# Patient Record
Sex: Female | Born: 1980 | Race: Black or African American | Hispanic: No | Marital: Married | Smoking: Never smoker
Health system: Southern US, Community
[De-identification: ages and names within clinical notes are randomized; demographics above are authoritative.]

## PROBLEM LIST (undated history)

## (undated) ENCOUNTER — Inpatient Hospital Stay (HOSPITAL_COMMUNITY): Payer: Self-pay

## (undated) DIAGNOSIS — F32A Depression, unspecified: Secondary | ICD-10-CM

## (undated) DIAGNOSIS — F419 Anxiety disorder, unspecified: Secondary | ICD-10-CM

## (undated) DIAGNOSIS — F329 Major depressive disorder, single episode, unspecified: Secondary | ICD-10-CM

## (undated) DIAGNOSIS — R42 Dizziness and giddiness: Secondary | ICD-10-CM

## (undated) DIAGNOSIS — T7840XA Allergy, unspecified, initial encounter: Secondary | ICD-10-CM

## (undated) DIAGNOSIS — R519 Headache, unspecified: Secondary | ICD-10-CM

## (undated) DIAGNOSIS — J45909 Unspecified asthma, uncomplicated: Secondary | ICD-10-CM

## (undated) DIAGNOSIS — K219 Gastro-esophageal reflux disease without esophagitis: Secondary | ICD-10-CM

## (undated) DIAGNOSIS — R51 Headache: Secondary | ICD-10-CM

## (undated) DIAGNOSIS — N809 Endometriosis, unspecified: Secondary | ICD-10-CM

## (undated) DIAGNOSIS — K589 Irritable bowel syndrome without diarrhea: Secondary | ICD-10-CM

## (undated) DIAGNOSIS — G43909 Migraine, unspecified, not intractable, without status migrainosus: Secondary | ICD-10-CM

## (undated) DIAGNOSIS — R569 Unspecified convulsions: Secondary | ICD-10-CM

## (undated) DIAGNOSIS — D689 Coagulation defect, unspecified: Secondary | ICD-10-CM

## (undated) DIAGNOSIS — F410 Panic disorder [episodic paroxysmal anxiety] without agoraphobia: Secondary | ICD-10-CM

## (undated) DIAGNOSIS — Z8619 Personal history of other infectious and parasitic diseases: Secondary | ICD-10-CM

## (undated) HISTORY — DX: Anxiety disorder, unspecified: F41.9

## (undated) HISTORY — DX: Personal history of other infectious and parasitic diseases: Z86.19

## (undated) HISTORY — DX: Coagulation defect, unspecified: D68.9

## (undated) HISTORY — DX: Unspecified asthma, uncomplicated: J45.909

## (undated) HISTORY — DX: Dizziness and giddiness: R42

## (undated) HISTORY — PX: ESOPHAGOGASTRODUODENOSCOPY: SHX1529

## (undated) HISTORY — DX: Unspecified convulsions: R56.9

## (undated) HISTORY — DX: Migraine, unspecified, not intractable, without status migrainosus: G43.909

## (undated) HISTORY — DX: Panic disorder (episodic paroxysmal anxiety): F41.0

## (undated) HISTORY — PX: NO PAST SURGERIES: SHX2092

## (undated) HISTORY — DX: Irritable bowel syndrome, unspecified: K58.9

## (undated) HISTORY — DX: Depression, unspecified: F32.A

## (undated) HISTORY — DX: Headache: R51

## (undated) HISTORY — DX: Gastro-esophageal reflux disease without esophagitis: K21.9

## (undated) HISTORY — DX: Headache, unspecified: R51.9

## (undated) HISTORY — DX: Allergy, unspecified, initial encounter: T78.40XA

## (undated) HISTORY — DX: Major depressive disorder, single episode, unspecified: F32.9

## (undated) HISTORY — DX: Endometriosis, unspecified: N80.9

---

## 2012-05-20 DIAGNOSIS — S069X9A Unspecified intracranial injury with loss of consciousness of unspecified duration, initial encounter: Secondary | ICD-10-CM

## 2012-05-20 DIAGNOSIS — S069XAA Unspecified intracranial injury with loss of consciousness status unknown, initial encounter: Secondary | ICD-10-CM

## 2012-05-20 HISTORY — DX: Unspecified intracranial injury with loss of consciousness status unknown, initial encounter: S06.9XAA

## 2012-05-20 HISTORY — DX: Unspecified intracranial injury with loss of consciousness of unspecified duration, initial encounter: S06.9X9A

## 2013-12-06 ENCOUNTER — Encounter: Payer: Self-pay | Admitting: Family Medicine

## 2013-12-06 ENCOUNTER — Ambulatory Visit (INDEPENDENT_AMBULATORY_CARE_PROVIDER_SITE_OTHER): Payer: BC Managed Care – PPO | Admitting: Family Medicine

## 2013-12-06 VITALS — BP 98/58 | HR 94 | Temp 99.4°F | Ht 65.25 in | Wt 146.0 lb

## 2013-12-06 DIAGNOSIS — F0781 Postconcussional syndrome: Secondary | ICD-10-CM

## 2013-12-06 DIAGNOSIS — F411 Generalized anxiety disorder: Secondary | ICD-10-CM

## 2013-12-06 DIAGNOSIS — G43009 Migraine without aura, not intractable, without status migrainosus: Secondary | ICD-10-CM

## 2013-12-06 DIAGNOSIS — R42 Dizziness and giddiness: Secondary | ICD-10-CM

## 2013-12-06 DIAGNOSIS — K219 Gastro-esophageal reflux disease without esophagitis: Secondary | ICD-10-CM

## 2013-12-06 DIAGNOSIS — F32A Depression, unspecified: Secondary | ICD-10-CM

## 2013-12-06 DIAGNOSIS — K589 Irritable bowel syndrome without diarrhea: Secondary | ICD-10-CM

## 2013-12-06 DIAGNOSIS — F3289 Other specified depressive episodes: Secondary | ICD-10-CM

## 2013-12-06 DIAGNOSIS — F329 Major depressive disorder, single episode, unspecified: Secondary | ICD-10-CM

## 2013-12-06 NOTE — Patient Instructions (Signed)
-  We have ordered labs or studies at this visit. It can take up to 1-2 weeks for results and processing. We will contact you with instructions IF your results are abnormal. Normal results will be [facility] to your [facility]. If you have not heard from us or can not find your results in [facility] in 2 weeks please contact our office.  Consider stopping your supplements and slowly weaning off of the amitriptyline if this is not helping  -PLEASE SIGN UP FOR MYCHART TODAY   We recommend the following healthy lifestyle measures: - eat a healthy diet consisting of lots of vegetables, fruits, beans, nuts, seeds, healthy meats such as white chicken and fish and whole grains.  - avoid fried foods, fast food, processed foods, sodas, red meet and other fattening foods.  - get a least 150 minutes of aerobic exercise per week.   Follow up in: 3 months for yearly physical exam

## 2013-12-06 NOTE — Progress Notes (Signed)
No chief complaint on file.   HPI:  Kelsey [name] is here to establish care. Recently moved here.  Last PCP and physical: pap in 10/2013 normal per her repot  Has the following chronic problems and concerns today:  There are no active problems to display for this patient.  Migraines/post-concussive syndrome/hx vertigo: -has had headaches and migraines for long time -worsened after bad fall 1 year ago -headaches daily over the last year - triggers (computer work, driving, reading) -headaches are sharp pain, stabbing pain, pressure bi-pariatal, R temporal, post occipital -followed by neurologist prior to move, had several MRIs in the past -currently taking tylenol or ibuprofen once per week -right before she moved headache specialist advised topamax titration and prn triptan which she doesn't want to tart until sees doctor here -headaches accompanied by ringing in ears, nausea, visual motion sensitivity (had months of vestibular rehab), dizziness, walks into things sometimes, light and noise sensitivity -had another fall about 1.5 months ago after fall on stairs -on disability in the past -denies: recent changes in headaches, fevers, weight loss, vision changes, aura, weakness or numbness -as needed meclizine  Contraception: -stable  IBS/GERD: -on amitriptyline 50 mg daily, prilosec and ranitidine -denies: constipation, diarrhea  Anxiety and Depression: -panic attacks -wants to see a psychiatrist here -has tried several medication, does not want to take benzos, sensitive to medications  ROS negative for unless reported above: fevers, unintentional weight loss, hearing or vision loss, chest pain, palpitations, struggling to breath, hemoptysis, melena, hematochezia, hematuria, falls, loc, si, thoughts of self harm  Past Medical History  Diagnosis Date  . Asthma   . Depression   . GERD (gastroesophageal reflux disease)   . Frequent headaches   . Migraines   . IBS (irritable  bowel syndrome)   . Panic attacks     Family History  Problem Relation Age of Onset  . Hypertension Mother   . Cancer Mother     multiple myeloma  . Hypertension Father   . Stroke Father   . Cancer Maternal Grandmother     CLL    History   Social History  . Marital Status: Married    Spouse Name: N/A    Number of Children: N/A  . Years of Education: N/A   Social History Main Topics  . Smoking status: Never Smoker   . Smokeless tobacco: None  . Alcohol Use: No  . Drug Use: No  . Sexual Activity: None   Other Topics Concern  . 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 , Disp: , Rfl: ;  Biotin 2500 MCG CAPS, Take by mouth., Disp: , Rfl: ;  Docosahexaenoic Acid (PRENATAL DHA PO), Take by mouth., Disp: , Rfl:  drospirenone-ethinyl estradiol (GIANVI) 3-0.02 MG tablet, Take 1 tablet by mouth daily., Disp: , Rfl: ;  Echinacea 350 MG CAPS, Take by mouth., Disp: , Rfl: ;  FERROUS SULFATE PO, Take 65 mg by mouth., Disp: , Rfl: ;  Flaxseed, Linseed, (FLAX SEED OIL PO), Take by mouth., Disp: , Rfl: ;  LORazepam (ATIVAN) 0.5 MG tablet, Take 0.5 mg by mouth every 8 (eight) hours as needed for anxiety (uses rarely for panic disorder)., Disp: , Rfl:  meclizine (ANTIVERT) 12.5 MG tablet, Take 12.5 mg by mouth 3 (three) times daily as needed for dizziness., Disp: , Rfl: ;  omeprazole (PRILOSEC) 20 MG capsule, Take 20 mg by mouth daily., Disp: , Rfl: ;  Pirbuterol Acetate (MAXAIR AUTOHALER IN), Inhale into the lungs., Disp: , Rfl: ;  ranitidine (ACID REDUCER) 150 MG  tablet, Take 150 mg by mouth 2 (two) times daily., Disp: , Rfl:   EXAM:  Filed Vitals:   12/06/13 1619  BP: 98/58  Pulse: 94  Temp: 99.4 F (37.4 C)    Body mass index is 24.12 kg/(m^2).  GENERAL: vitals reviewed and listed above, alert, oriented, appears well hydrated and in no acute distress  HEENT: atraumatic, conjunttiva clear, no obvious abnormalities on inspection of external nose and ears  NECK: no obvious masses on inspection  LUNGS: clear to auscultation bilaterally, no wheezes, rales or rhonchi, good air movement  CV: HRRR, no peripheral edema  MS: moves all extremities without noticeable abnormality  PSYCH: pleasant and cooperative, no obvious depression or anxiety  ASSESSMENT AND PLAN:  Discussed the following assessment and plan:  Generalized anxiety disorder - Plan: Ambulatory referral to Psychiatry  Depression - Plan: Ambulatory referral to Psychiatry  Migraine without aura and without status migrainosus, not intractable - Plan: Ambulatory referral to Neurology  IBS (irritable bowel syndrome)  Gastroesophageal reflux disease, esophagitis presence not specified  Post concussion syndrome - Plan: Ambulatory referral to Neurology  Vertigo - Plan: Ambulatory referral to Neurology  -We reviewed the PMH, PSH, FH, SH, Meds and Allergies. -We provided refills for any medications we will prescribe as needed. -We addressed current concerns per orders and patient instructions. -We have asked for records for pertinent exams, studies, vaccines and notes from previous providers. -We have advised patient to follow up per instructions below.    -Patient advised to return or notify a doctor immediately if symptoms worsen or persist or new concerns arise.  Patient Instructions  -We have ordered labs or studies at this visit. It can take up to 1-2 weeks for results and processing. We will contact you with instructions IF your results are abnormal. Normal results  will be released to your [facility]. If you have not heard from [place] or can not find your results in [facility] in 2 weeks please contact our office.  Consider stopping your supplements and slowly weaning off of the amitriptyline if this is not helping  -PLEASE SIGN UP FOR MYCHART TODAY   We recommend the following healthy lifestyle measures: - eat a healthy diet consisting of lots of vegetables, fruits, beans, nuts, seeds, healthy meats such as white chicken and fish and whole grains.  - avoid fried foods, fast food, processed foods, sodas, red meet and other fattening foods.  - get a least 150 minutes of aerobic exercise per week.   Follow up in: 3 months for yearly physical exam      Chereese Cilento R.

## 2013-12-06 NOTE — Progress Notes (Signed)
Pre visit review using our clinic review tool, if applicable. No additional management support is needed unless otherwise documented below in the visit note. 

## 2013-12-24 ENCOUNTER — Encounter: Payer: Self-pay | Admitting: Neurology

## 2013-12-29 ENCOUNTER — Ambulatory Visit (INDEPENDENT_AMBULATORY_CARE_PROVIDER_SITE_OTHER): Payer: BC Managed Care – PPO | Admitting: Neurology

## 2013-12-29 ENCOUNTER — Encounter: Payer: Self-pay | Admitting: Neurology

## 2013-12-29 VITALS — BP 110/84 | HR 106 | Ht 65.75 in | Wt 145.2 lb

## 2013-12-29 DIAGNOSIS — F0781 Postconcussional syndrome: Secondary | ICD-10-CM

## 2013-12-29 DIAGNOSIS — R42 Dizziness and giddiness: Secondary | ICD-10-CM

## 2013-12-29 DIAGNOSIS — R51 Headache: Secondary | ICD-10-CM

## 2013-12-29 DIAGNOSIS — R519 Headache, unspecified: Secondary | ICD-10-CM

## 2013-12-29 MED ORDER — RIZATRIPTAN BENZOATE 10 MG PO TBDP: 10.0000 mg | ORAL_TABLET | ORAL | Status: DC | PRN

## 2013-12-29 MED ORDER — TOPIRAMATE 100 MG PO TABS: 100.0000 mg | ORAL_TABLET | Freq: Every day | ORAL | Status: DC

## 2013-12-29 NOTE — Patient Instructions (Signed)
1. Start Topiramate 25mg  as instructed 2. Resume amitriptyline 50mg  at bedtime 3. Take Maxalt 10mg  as needed at onset of headache. Do not take more than 2-3 a week. 4. Continue headache diary

## 2013-12-29 NOTE — Progress Notes (Signed)
NEUROLOGY CONSULTATION NOTE  Kelsey [name] MRN: [id number] DOB: April 25, 1981  Referring provider: Dr. Colin Benton Primary care provider: Dr. Colin Benton  Reason for consult:  Establish care for migraines  Dear Dr Maudie Mercury:  Thank you for your kind referral of Kelsey [name] for consultation of the above symptoms. Although her history is well known to you, please allow me to reiterate it for the purpose of our medical record. Records from [place] were personally reviewed.  HISTORY OF PRESENT ILLNESS: This is a pleasant 33 year old right-handed woman with a history of vestibular neuritis with chronic intermittent vertigo, GERD, IBS, and post-concussive headaches.  She had been living in [place], followed by [facility].  In June 2014, she woke up early morning feeling flushed with GI upset, went to the bathroom and passed out, waking up on the bathroom floor with cuts around her left eye, requiring 12 stitches.  A week later, she was back to work then felt that the building was rocking back and forth.  She was diagnosed with a concussion and did a month of physical and cognitive rest.  She started having daily headaches described as a dull constant pressure with specific triggers, particularly computer screens, busy environments like grocery stores, bright environments, focusing on crafts. Worst headaches are stabbing/throbbing pains in the back/right side of the head with dizziness, tightening sensation at the crown or left side of the head. There is occasional nausea, photo and phonophobia.  Headaches worse with looking at screens or long periods of concentration. When bad headaches start, they will often last all day. She has continuous 6 or 7 over 10 pain with exacerbations 5-6 times a week up to 9 or 10 over 10. Once a week she will wake up with bad headache. No aura symptoms such as visual changes, however she feels that her vision has been blurred.  She has seen neuro-ophthalmology and cleared  from their standpoint, prescribed prescription lenses that helped with the blurred vision but not the headaches.  Prior to moving to [place], she had been prescribed Topamax and prn Maxalt.  She had only started the Maxalt since July, and has taken it twice, with good effect.  She has not started the Topamax.  She has been taking amitriptyline 36m qhs for IBS.  This was reduced to 222mqhs last month, with note of increase in headaches and worse sleep. She has not taken any over the counter pain medications since May. She had noted some word-finding difficulties after the concussion [place].  There is a family history of migraines in her uncle, her mother has headaches.  She continues to have occasional vertigo when lying in bed. Turning her head does not help.  She has stopped taking meclizine.  She did vestibular therapy in [place] for several months, with eye tracking and vestibular exercises. She reports a history of panic attacks and has used ativan in the past. She had mild depression in the past. She has occasional tingling in both hands.  She reports weakness in both legs, no paresthesias, she has fallen down the stairs twice and hit her head against the wall, no loss of conscioiusness.  Diagnostic Data [facility]: 12/2012: MRI OF THE BRAIN WITHOUT INTRAVENOUS CONTRAST History: History of head trauma, worsening headaches when supine. Comparison: CT scan of the head 06/15/2001. Technique: Multiplanar, multisequence magnetic resonance images of the brain were obtained  without administration of IV contrast.  Findings:  The brain shows expected morphology, signal intensity, and volume for age. No space occupying lesion, intracranial hemorrhage, edema, mass effect, midline shift, extra axial fluid collection, or hydrocephalus is identified. The ventricles, sulci, and basal cisterns are normal in  configuration.  The pituitary gland, corpus callosum, pineal region, and brainstem are unremarkable. The craniovertebral junction is within normal limits. There is no restricted diffusion. Expected vascular flow voids are present.  Mild right maxillary sinus mucosal thickening is noted. The remainder the paranasal sinuses and mastoid air cells are well aerated. Orbital contents are unremarkable. No calvarial abnormalities are identified.  Impression:  Unremarkable MRI of the brain.  MRV Brain: Post-contrast and Coronal 2-D time of flight magnetic resonance venography of the brain was performed, and 3D/multiplanar MIPS were obtained. 0.1 mmol per kilogram gadolinium contrast was administered. FINDINGS: There is no evidence for cortical or dural venous thrombosis. The left transverse and sigmoid sinuses are hypoplastic. Arachnoid granulations are noted at the transverse/sigmoid junction. MRV findings were confirmed on the 3D reformatted images. IMPRESSION: No evidence for cortical or dural venous thrombosis.  PAST MEDICAL HISTORY: Past Medical History  Diagnosis Date  . Asthma   . Depression   . GERD (gastroesophageal reflux disease)   . Frequent headaches   . Migraines   . IBS (irritable bowel syndrome)   . Panic attacks     PAST SURGICAL HISTORY: History reviewed. No pertinent past surgical history.  MEDICATIONS: Current Outpatient Prescriptions on File Prior to Visit  Medication Sig Dispense Refill  . acetaminophen (TYLENOL) 325 MG tablet Take 650 mg by mouth every 6 (six) hours as needed.      Marland Kitchen amitriptyline (ELAVIL) 25 MG tablet Take 50 mg by mouth at bedtime.       . Biotin 2500 MCG CAPS Take by mouth.      . Docosahexaenoic Acid (PRENATAL DHA PO) Take by mouth.      . drospirenone-ethinyl estradiol (GIANVI) 3-0.02 MG tablet Take 1 tablet by mouth daily.      Marland Kitchen FERROUS SULFATE PO Take 65 mg by mouth.      . Flaxseed, Linseed, (FLAX SEED OIL PO) Take by mouth.      Marland Kitchen  LORazepam (ATIVAN) 0.5 MG tablet Take 0.5 mg by mouth every 8 (eight) hours as needed for anxiety (uses rarely for panic disorder).      . meclizine (ANTIVERT) 12.5 MG tablet Take 12.5 mg by mouth 3 (three) times daily as needed for dizziness.      Marland Kitchen omeprazole (PRILOSEC) 20 MG capsule Take 20 mg by mouth daily.      . Pirbuterol Acetate (MAXAIR AUTOHALER IN) Inhale into the lungs.      . ranitidine (ACID REDUCER) 150 MG tablet Take 150 mg by mouth 2 (two) times daily.       No current facility-administered medications on file prior to visit.    ALLERGIES: No Known Allergies  FAMILY HISTORY: Family History  Problem Relation Age of Onset  . Hypertension Mother   . Cancer Mother     multiple myeloma  . Hypertension Father   . Stroke Father   . Cancer Maternal Grandmother     CLL    SOCIAL HISTORY: History   Social History  . Marital Status: Married    Spouse Name: N/A    Number of Children: N/A  . Years of Education: N/A   Occupational History  . Not on file.   Social History  Main Topics  . Smoking status: Never Smoker   . Smokeless tobacco: Not on file  . Alcohol Use: No  . Drug Use: No  . Sexual Activity: Not on file   Other Topics Concern  . 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      Home Situation: lives with husband      Spiritual Beliefs: Christian      Lifestyle: doing 20 minutes of exercise and a little weight lifting daily; diet is healthy             REVIEW OF SYSTEMS: Constitutional: No fevers, chills, or sweats, no generalized fatigue, change in appetite Eyes: No visual changes, double vision, eye pain Ear, nose and throat: No hearing loss, ear pain, nasal congestion, sore throat Cardiovascular: No chest pain, palpitations Respiratory:  No shortness of breath at rest or with exertion, wheezes GastrointestinaI: No nausea, vomiting, diarrhea, abdominal pain, fecal incontinence Genitourinary:   No dysuria, urinary retention or frequency Musculoskeletal:  No neck pain, back pain Integumentary: No rash, pruritus, skin lesions Neurological: as above Psychiatric: No depression, insomnia, anxiety Endocrine: No palpitations, fatigue, diaphoresis, mood swings, change in appetite, change in weight, increased thirst Hematologic/Lymphatic:  No anemia, purpura, petechiae. Allergic/Immunologic: no itchy/runny eyes, nasal congestion, recent allergic reactions, rashes  PHYSICAL EXAM: Filed Vitals:   12/29/13 0755  BP: 110/84  Pulse: 106   General: No acute distress Head:  Normocephalic/atraumatic Eyes: Fundoscopic exam shows bilateral sharp discs, no vessel changes, exudates, or hemorrhages Neck: supple, no paraspinal tenderness, full range of motion Back: No paraspinal tenderness Heart: regular rate and rhythm Lungs: Clear to auscultation bilaterally. Vascular: No carotid bruits. Skin/Extremities: No rash, no edema Neurological Exam: Mental status: alert and oriented to person, place, and time, no dysarthria or aphasia, Fund of knowledge is appropriate.  Recent and remote memory are intact.  Attention and concentration are normal.    Able to name objects and repeat phrases. Cranial nerves: CN I: not tested CN II: pupils equal, round and reactive to light, visual fields intact, fundi unremarkable. CN III, IV, VI:  full range of motion, no nystagmus, no ptosis CN V: facial sensation intact CN VII: upper and lower face symmetric CN VIII: hearing intact to finger rub CN IX, X: gag intact, uvula midline CN XI: sternocleidomastoid and trapezius muscles intact CN XII: tongue midline Bulk & Tone: normal, no fasciculations. Motor: 5/5 throughout with no pronator drift. Sensation: intact to light touch, cold, pin, vibration and joint position sense.  No extinction to double simultaneous stimulation.  Romberg test negative Deep Tendon Reflexes: +2 throughout, no ankle clonus Plantar  responses: downgoing bilaterally Cerebellar: no incoordination on finger to nose, heel to shin. No dysdiadochokinesia Gait: narrow-based and steady, able to tandem walk adequately. Tremor: none  IMPRESSION: This is a pleasant 33 year old right-handed woman with a history of vestibular neuritis with chronic intermittent vertigo, GERD, IBS, and post-concussive headaches.  She continues to have chronic daily headaches, and has noticed an increase in headaches when she reduced amitriptyline.  She will increase dose back up to 83m qhs.  She had been given a prescription for Topamax for headache prophylaxis [place] discussed with her agreement for adding a second agent since she continues to have headaches. She will start low dose Topamax with uptitration as instructed.  Side effects were discussed. She has no plans for pregnancy at this time, and will start  taking daily folic acid.  She will take prn Maxalt at the onset of headaches for rescue, and knows to minimize rescue medications to 2-3/week to avoid rebound headaches.  She will continue her headache diary and follow-up in 3 months.  Thank you for allowing me to participate in the care of this patient. Please do not hesitate to call for any questions or concerns.   Ellouise Newer, M.D.  CC: Dr. Maudie Mercury

## 2013-12-31 ENCOUNTER — Encounter: Payer: Self-pay | Admitting: Neurology

## 2013-12-31 DIAGNOSIS — R42 Dizziness and giddiness: Secondary | ICD-10-CM

## 2013-12-31 DIAGNOSIS — F0781 Postconcussional syndrome: Secondary | ICD-10-CM | POA: Insufficient documentation

## 2013-12-31 DIAGNOSIS — R51 Headache: Secondary | ICD-10-CM

## 2013-12-31 DIAGNOSIS — R519 Headache, unspecified: Secondary | ICD-10-CM | POA: Insufficient documentation

## 2013-12-31 HISTORY — DX: Dizziness and giddiness: R42

## 2014-01-07 ENCOUNTER — Telehealth: Payer: Self-pay | Admitting: *Deleted

## 2014-01-07 NOTE — Telephone Encounter (Signed)
Patient having side affects from her topamax ; new type of headache since she increase to two pills daily. Please advise

## 2014-01-10 NOTE — Telephone Encounter (Signed)
Please advise 

## 2014-01-10 NOTE — Telephone Encounter (Signed)
Would go back down to 1 tablet daily for another 2 weeks, then try the increase again. Thanks

## 2014-01-10 NOTE — Telephone Encounter (Signed)
Patient was notified of advisement. 

## 2014-01-17 ENCOUNTER — Encounter: Payer: Self-pay | Admitting: Family Medicine

## 2014-01-17 ENCOUNTER — Encounter: Payer: Self-pay | Admitting: Neurology

## 2014-01-17 ENCOUNTER — Other Ambulatory Visit: Payer: Self-pay | Admitting: *Deleted

## 2014-01-17 MED ORDER — ALBUTEROL SULFATE HFA 108 (90 BASE) MCG/ACT IN AERS: 2.0000 | INHALATION_SPRAY | RESPIRATORY_TRACT | Status: DC | PRN

## 2014-01-17 NOTE — Telephone Encounter (Signed)
Rx sent and pt notified via Mychart message.

## 2014-01-20 ENCOUNTER — Telehealth: Payer: Self-pay | Admitting: Neurology

## 2014-01-20 NOTE — Telephone Encounter (Signed)
FYI: I spoke with her she states she did not see/check your e-mail from 8/31, so she was still taking the Topamax. She is going to stop it and see how she does.

## 2014-01-20 NOTE — Telephone Encounter (Signed)
She states for a few days she has had increased heart rate with exertion, also c/o sob when this happens. She has been taking the Topamax for about 1 month, but she is wondering if it could be Topamax causing this. Please advise. She has been minimizing her activity since this has been going on.

## 2014-01-20 NOTE — Telephone Encounter (Signed)
Pt needs to someone about side effects of medication  [phone]

## 2014-01-20 NOTE — Telephone Encounter (Signed)
Unclear if this is the Topamax, I had sent her an email on 8/31 to stop the Topamax and assess if symptoms continue, then likely not due to Topamax. Monitor shortness of breath, if worsens, call PCP or go to ER. Thanks

## 2014-01-27 ENCOUNTER — Other Ambulatory Visit: Payer: Self-pay | Admitting: *Deleted

## 2014-01-27 MED ORDER — FLUTICASONE PROPIONATE 50 MCG/ACT NA SUSP: 2.0000 | Freq: Every day | NASAL | Status: DC

## 2014-01-27 MED ORDER — AMITRIPTYLINE HCL 50 MG PO TABS: 50.0000 mg | ORAL_TABLET | Freq: Every day | ORAL | Status: DC

## 2014-01-27 NOTE — Telephone Encounter (Signed)
Rxs faxed to [facility] at [phone] and the pt was notified via Mychart.

## 2014-03-08 ENCOUNTER — Ambulatory Visit (INDEPENDENT_AMBULATORY_CARE_PROVIDER_SITE_OTHER): Payer: BC Managed Care – PPO | Admitting: Family Medicine

## 2014-03-08 ENCOUNTER — Encounter: Payer: Self-pay | Admitting: Family Medicine

## 2014-03-08 VITALS — BP 98/62 | HR 92 | Temp 98.1°F | Ht 65.25 in | Wt 143.0 lb

## 2014-03-08 DIAGNOSIS — R748 Abnormal levels of other serum enzymes: Secondary | ICD-10-CM

## 2014-03-08 DIAGNOSIS — F418 Other specified anxiety disorders: Secondary | ICD-10-CM

## 2014-03-08 DIAGNOSIS — K580 Irritable bowel syndrome with diarrhea: Secondary | ICD-10-CM | POA: Insufficient documentation

## 2014-03-08 DIAGNOSIS — F329 Major depressive disorder, single episode, unspecified: Secondary | ICD-10-CM | POA: Insufficient documentation

## 2014-03-08 DIAGNOSIS — L709 Acne, unspecified: Secondary | ICD-10-CM

## 2014-03-08 DIAGNOSIS — Z23 Encounter for immunization: Secondary | ICD-10-CM

## 2014-03-08 DIAGNOSIS — J4530 Mild persistent asthma, uncomplicated: Secondary | ICD-10-CM | POA: Insufficient documentation

## 2014-03-08 DIAGNOSIS — F32A Depression, unspecified: Secondary | ICD-10-CM

## 2014-03-08 DIAGNOSIS — K589 Irritable bowel syndrome without diarrhea: Secondary | ICD-10-CM

## 2014-03-08 DIAGNOSIS — Z0000 Encounter for general adult medical examination without abnormal findings: Secondary | ICD-10-CM

## 2014-03-08 DIAGNOSIS — J302 Other seasonal allergic rhinitis: Secondary | ICD-10-CM | POA: Insufficient documentation

## 2014-03-08 DIAGNOSIS — F419 Anxiety disorder, unspecified: Secondary | ICD-10-CM

## 2014-03-08 DIAGNOSIS — K219 Gastro-esophageal reflux disease without esophagitis: Secondary | ICD-10-CM | POA: Insufficient documentation

## 2014-03-08 LAB — CBC WITH DIFFERENTIAL/PLATELET
BASOS PCT: 0.5 % (ref 0.0–3.0)
Basophils Absolute: 0 10*3/uL (ref 0.0–0.1)
EOS ABS: 0.2 10*3/uL (ref 0.0–0.7)
EOS PCT: 3.8 % (ref 0.0–5.0)
HCT: 42.3 % (ref 36.0–46.0)
HEMOGLOBIN: 13.9 g/dL (ref 12.0–15.0)
Lymphocytes Relative: 36.2 % (ref 12.0–46.0)
Lymphs Abs: 1.5 10*3/uL (ref 0.7–4.0)
MCHC: 33 g/dL (ref 30.0–36.0)
MCV: 86.7 fl (ref 78.0–100.0)
Monocytes Absolute: 0.3 10*3/uL (ref 0.1–1.0)
Monocytes Relative: 6.1 % (ref 3.0–12.0)
NEUTROS ABS: 2.2 10*3/uL (ref 1.4–7.7)
Neutrophils Relative %: 53.4 % (ref 43.0–77.0)
Platelets: 246 10*3/uL (ref 150.0–400.0)
RBC: 4.88 Mil/uL (ref 3.87–5.11)
RDW: 12.7 % (ref 11.5–15.5)
WBC: 4.1 10*3/uL (ref 4.0–10.5)

## 2014-03-08 LAB — LIPID PANEL
Cholesterol: 150 mg/dL (ref 0–200)
HDL: 63.2 mg/dL (ref >39.00)
LDL Cholesterol: 75 mg/dL (ref 0–99)
NonHDL: 86.8
Total CHOL/HDL Ratio: 2
Triglycerides: 57 mg/dL (ref 0.0–149.0)
VLDL: 11.4 mg/dL (ref 0.0–40.0)

## 2014-03-08 LAB — LIPASE: Lipase: 65 U/L — ABNORMAL HIGH (ref 11.0–59.0)

## 2014-03-08 LAB — HEMOGLOBIN A1C: Hgb A1c MFr Bld: 5.2 % (ref 4.6–6.5)

## 2014-03-08 NOTE — Patient Instructions (Addendum)
BEFORE YOU LEAVE: -flu vaccine -Tdap -labs -schedule 4 month follow up  Call to schedule appointment with the psychiatrist: Dr. Charlies SilversParish A. Mckinney, MD  Address: [address], [place], [place] [address]  [phone]  Vitamin D3 1000 IU daily; calcium 1200mg  daily - most of this probably comes from the diet  For hte acne: try benzoyl peroxide 2-8% wash daily or as tolerated in the morning and GREEN CREAM level 3 in the evening as tolerated, good noncomodegenic sunscreen on face (crave or cetaphil acne)  -We have ordered labs or studies at this visit. It can take up to 1-2 weeks for results and processing. We will contact you with instructions IF your results are abnormal. Normal results will be released to your [facility]. If you have not heard from us or can not find your results in [facility] in 2 weeks please contact our office.  -PLEASE SIGN UP FOR MYCHART TODAY   We recommend the following healthy lifestyle measures: - eat a healthy diet consisting of lots of vegetables, fruits, beans, nuts, seeds, healthy meats such as white chicken and fish and whole grains.  - avoid fried foods, fast food, processed foods, sodas, red meet and other fattening foods.  - get a least 150 minutes of aerobic exercise per week.

## 2014-03-08 NOTE — Progress Notes (Signed)
Pre visit review using our clinic review tool, if applicable. No additional management support is needed unless otherwise documented below in the visit note. 

## 2014-03-08 NOTE — Progress Notes (Signed)
No chief complaint on file.   HPI:  Here for CPE:  -Concerns and/or follow up today: none  GERD/IBS: -meds:prilosec, ranitidine -denies: frequent symptoms, dysphagia  Allergies/Mild intermittent asthma: -meds:alb prn, flonase and zyrtec - intermittently, maxair -denies wheezing, SOB, sinus pain, fevers  Migraines/Vertigo: -seeing neurologist  ACNE: -on face -on topical abx and retinoid in the past -denies pain, itching  Depression and Anxiety: -referred to psychiatrist - she is not seeing the psychiatrist as lost her contact number -she is seeing counselor at [facility] -denies: SI, worsening  -Diet: variety of foods, balance and well rounded, larger portion sizes  -Exercise: no regular exercise  -Taking folic acid, vitamin D or calcium: yes  -Diabetes and Dyslipidemia Screening: doing today  -Hx of HTN: no  -Vaccines: flu and tdap today  -pap history: reports had 08/2013 and normal - reports all normal  -FDLMP: 2 weeks ago  -sexual activity: yes, female partner, no new partners  -wants STI testing: no  -FH breast, colon or ovarian ca: see FH Last mammogram: n/a Last colon cancer screening:n/a  -Alcohol, Tobacco, drug use: see social history  Review of Systems - no fevers, unintentional weight loss, vision loss, hearing loss, chest pain, sob, hemoptysis, melena, hematochezia, hematuria, genital discharge, changing or concerning skin lesions, bleeding, bruising, loc, thoughts of self harm or SI  Past Medical History  Diagnosis Date  . Asthma   . Depression   . GERD (gastroesophageal reflux disease)   . Frequent headaches   . Migraines   . IBS (irritable bowel syndrome)   . Panic attacks   . Vertigo 12/31/2013    No past surgical history on file.  Family History  Problem Relation Age of Onset  . Hypertension Mother   . Cancer Mother     multiple myeloma  . Hypertension Father   . Stroke Father   . Cancer Maternal Grandmother     CLL    History    Social History  . Marital Status: Married    Spouse Name: N/A    Number of Children: N/A  . Years of Education: N/A   Social History Main Topics  . Smoking status: Never Smoker   . Smokeless tobacco: None  . Alcohol Use: No  . Drug Use: No  . Sexual Activity: None   Other Topics Concern  . 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 , Disp: , Rfl: ;  Biotin 2500 MCG CAPS, Take by mouth., Disp: , Rfl:  Docosahexaenoic Acid (PRENATAL DHA PO), Take by mouth., Disp: , Rfl: ;  drospirenone-ethinyl estradiol (GIANVI) 3-0.02 MG tablet, Take 1 tablet by mouth daily., Disp: , Rfl: ;  FERROUS SULFATE PO, Take 65 mg by mouth., Disp: , Rfl: ;  Flaxseed, Linseed, (FLAX SEED OIL PO), Take by mouth., Disp: , Rfl: ;  fluticasone (FLONASE) 50 MCG/ACT nasal spray, Place 2 sprays into both nostrils daily. Please give 90 day supply, Disp: 16 g, Rfl: 3 meclizine (ANTIVERT) 12.5 MG tablet, Take 12.5 mg by mouth 3 (three) times daily as needed for dizziness., Disp: , Rfl: ;  omeprazole (PRILOSEC) 20 MG capsule, Take 20 mg by mouth daily., Disp: , Rfl: ;  Pirbuterol Acetate (MAXAIR AUTOHALER IN), Inhale into the lungs., Disp: , Rfl: ;  ranitidine (ACID REDUCER) 150 MG tablet, Take 150 mg by mouth 2 (two) times daily., Disp: , Rfl:  rizatriptan (MAXALT-MLT) 10 MG disintegrating tablet, Take 1 tablet (10 mg total) by mouth as needed for  migraine. May repeat in 2 hours if needed, Disp: 9 tablet, Rfl: 11;  topiramate (TOPAMAX) 100 MG tablet, Take 1 tablet (100 mg total) by mouth daily., Disp: 90 tablet, Rfl: 3;  LORazepam (ATIVAN) 0.5 MG tablet, Take 0.5 mg by mouth every 8 (eight) hours as needed for anxiety (uses rarely for panic disorder)., Disp: , Rfl:   EXAM:  Filed Vitals:   03/08/14 0949  BP: 98/62  Pulse: 92  Temp: 98.1 F (36.7 C)    GENERAL: vitals reviewed and listed below, alert, oriented, appears well hydrated and in no acute distress  HEENT: head atraumatic, PERRLA, normal appearance of eyes, ears, nose and mouth. moist mucus membranes.  NECK: supple, no masses or lymphadenopathy  LUNGS: clear to auscultation bilaterally, no rales, rhonchi or wheeze  CV: HRRR, no peripheral edema or cyanosis, normal pedal pulses  BREAST: normal appearance - no lesions or discharge, on palpation normal breast tissue without any suspicious masses - fibrocystic bilat equal findings, mobile  ABDOMEN: bowel sounds normal, soft, non tender to palpation, no masses, no rebound or guarding  GU: declined  RECTAL: refused  SKIN: no rash or abnormal lesions  MS: normal gait, moves all extremities normally  NEURO: CN II-XII grossly intact, normal muscle strength and sensation to light touch on extremities  PSYCH: normal affect, pleasant and cooperative  ASSESSMENT AND PLAN:  Discussed the following assessment and plan:  Asthma, mild persistent, uncomplicated  Visit for preventive health examination - Plan: Lipid Panel, Hemoglobin A1c, CBC with Differential  Gastroesophageal reflux disease, esophagitis presence not specified -continue current tx -lipase as she reports elevated in the past 5 years ago and not sure why  Seasonal allergies -continue current tx  IBS (irritable bowel syndrome)  Anxiety and depression -supported, counseled, number given for psychiatry  Acne, unspecified acne type -benzoyl peroxide  and retinoid, add topical or oral abx if needed  -Discussed and advised all [place] preventive services health task force level A and B recommendations for age, sex and risks.  -Advised at least 150 minutes of exercise per week and a healthy diet low in saturated fats and sweets and consisting of fresh fruits and vegetables, lean meats such as fish and white chicken and whole grains.  -labs, studies and vaccines per orders this encounter  Orders Placed This Encounter  Procedures  . Lipid Panel  . Hemoglobin A1c  . CBC with Differential  . Lipase    Patient advised to return to clinic immediately if symptoms worsen or persist or new concerns.  Patient Instructions  BEFORE YOU LEAVE: -flu vaccine -Tdap -labs -schedule 4 month follow up  Call to schedule appointment with the psychiatrist: Dr. Talmage Coin, MD  Address: [address] [place], [place] [address]  [phone]  Vitamin D3 1000 IU daily; calcium 1247m daily - most of this  probably comes from the diet  For hte acne: try benzoyl peroxide 2-8% wash daily or as tolerated in the morning and GREEN CREAM level 3 in the evening as tolerated, good noncomodegenic sunscreen on face (crave or cetaphil acne)  -We have ordered labs or studies at this visit. It can take up to 1-2 weeks for results and processing. We will contact you with instructions IF your results are abnormal. Normal results will be released to your [facility]. If you have not heard from [place] or can not find your results in [facility] in 2 weeks please contact our office.  -PLEASE SIGN UP FOR MYCHART TODAY   We recommend the following healthy lifestyle measures: - eat a healthy diet consisting of lots of vegetables, fruits, beans, nuts, seeds, healthy meats such as white chicken and fish and whole grains.  - avoid fried foods, fast food, processed foods, sodas, red meet and other fattening foods.  - get a least 150 minutes of aerobic exercise per week.         Return in about 4 months (around 07/09/2014) for follow up.  Colin Benton R.

## 2014-03-08 NOTE — Addendum Note (Signed)
Addended by: Johnella MoloneyFUNDERBURK, Dan Scearce A on: 03/08/2014 10:29 AM   Modules accepted: Orders

## 2014-03-10 NOTE — Addendum Note (Signed)
Addended by: Johnella MoloneyFUNDERBURK, JO A on: 03/10/2014 02:49 PM   Modules accepted: Orders

## 2014-03-17 ENCOUNTER — Encounter: Payer: Self-pay | Admitting: Gastroenterology

## 2014-03-22 ENCOUNTER — Ambulatory Visit (INDEPENDENT_AMBULATORY_CARE_PROVIDER_SITE_OTHER): Payer: BC Managed Care – PPO | Admitting: Physician Assistant

## 2014-03-22 ENCOUNTER — Encounter: Payer: Self-pay | Admitting: Physician Assistant

## 2014-03-22 ENCOUNTER — Other Ambulatory Visit (INDEPENDENT_AMBULATORY_CARE_PROVIDER_SITE_OTHER): Payer: BC Managed Care – PPO

## 2014-03-22 VITALS — BP 98/58 | HR 72 | Ht 64.75 in | Wt 141.5 lb

## 2014-03-22 DIAGNOSIS — R748 Abnormal levels of other serum enzymes: Secondary | ICD-10-CM

## 2014-03-22 LAB — CBC WITH DIFFERENTIAL/PLATELET
BASOS ABS: 0 10*3/uL (ref 0.0–0.1)
Basophils Relative: 0.8 % (ref 0.0–3.0)
EOS ABS: 0.1 10*3/uL (ref 0.0–0.7)
Eosinophils Relative: 2.6 % (ref 0.0–5.0)
HCT: 42.5 % (ref 36.0–46.0)
Hemoglobin: 14.1 g/dL (ref 12.0–15.0)
Lymphocytes Relative: 34.9 % (ref 12.0–46.0)
Lymphs Abs: 1.8 10*3/uL (ref 0.7–4.0)
MCHC: 33.2 g/dL (ref 30.0–36.0)
MCV: 86 fl (ref 78.0–100.0)
MONO ABS: 0.3 10*3/uL (ref 0.1–1.0)
Monocytes Relative: 5 % (ref 3.0–12.0)
NEUTROS ABS: 3 10*3/uL (ref 1.4–7.7)
Neutrophils Relative %: 56.7 % (ref 43.0–77.0)
Platelets: 271 10*3/uL (ref 150.0–400.0)
RBC: 4.94 Mil/uL (ref 3.87–5.11)
RDW: 12.3 % (ref 11.5–15.5)
WBC: 5.3 10*3/uL (ref 4.0–10.5)

## 2014-03-22 LAB — COMPREHENSIVE METABOLIC PANEL
ALK PHOS: 43 U/L (ref 39–117)
ALT: 12 U/L (ref 0–35)
AST: 16 U/L (ref 0–37)
Albumin: 3.4 g/dL — ABNORMAL LOW (ref 3.5–5.2)
BILIRUBIN TOTAL: 0.3 mg/dL (ref 0.2–1.2)
BUN: 12 mg/dL (ref 6–23)
CO2: 25 mEq/L (ref 19–32)
CREATININE: 0.9 mg/dL (ref 0.4–1.2)
Calcium: 9.3 mg/dL (ref 8.4–10.5)
Chloride: 105 mEq/L (ref 96–112)
GFR: 89.17 mL/min (ref >60.00)
Glucose, Bld: 84 mg/dL (ref 70–99)
Potassium: 4.3 mEq/L (ref 3.5–5.1)
Sodium: 138 mEq/L (ref 135–145)
Total Protein: 7.2 g/dL (ref 6.0–8.3)

## 2014-03-22 LAB — LIPASE: LIPASE: 56 U/L (ref 11.0–59.0)

## 2014-03-22 LAB — AMYLASE: Amylase: 120 U/L (ref 27–131)

## 2014-03-22 NOTE — Patient Instructions (Addendum)
You have been scheduled for an abdominal ultrasound at [facility] (1st floor of hospital) on 03/28/14 at 9:00 am. Please arrive 15 minutes prior to your appointment for registration. Make certain  to have nothing to eat or drink after midnight  prior to your appointment. Should you need to reschedule your appointment, please contact radiology at [phone]. This test typically takes about 30 minutes to perform.  Your physician has requested that you go to the basement for the following lab work before leaving today: CBC, CMET, Amylase, Lipase

## 2014-03-22 NOTE — Progress Notes (Signed)
Patient ID: Kelsey [name], female   DOB: 1980/07/29, 33 y.o.   MRN: [id number]    HPI: Kelsey [name] is a 33 year old female referred for evaluation by Dr. Maudie Mercury due to an elevated lipase.  Kelsey [name] is a 34 year old African-American female who relocated to the [place] area from [facility] 5 months ago. She has a history of mild persistent asthma, gastroesophageal reflux disease, seasonal allergies, IBS, anxiety and depression,migraine headaches, and acne. She states that approximately 5 years ago she was evaluated at [facility] for gastroesophageal reflux disease. At that time she had blood work that found her to have a mildly elevated lipase. She says it was repeated a couple times and always stayed mildly elevated. She has never had any type of imaging of the biliary tree. She has no abdominal pain and no nausea or vomiting. Her weight has been stable. She has no clay-colored stools and no oily stools.  She does have a history of GERD for which she uses omeprazole with good relief. She also has a history of IBS and tends to have nugget like stools. She has been using fiber and water with good relief. She has had no bright red blood per rectum and has not had melena. Her menses are normal and her LMP was 3 weeks ago   Past Medical History  Diagnosis Date  . Asthma   . Depression   . GERD (gastroesophageal reflux disease)   . Frequent headaches   . Migraines   . IBS (irritable bowel syndrome)   . Panic attacks   . Vertigo 12/31/2013  . Anxiety and depression     History reviewed. No pertinent past surgical history. Family History  Problem Relation Age of Onset  . Hypertension Mother   . Cancer Mother     multiple myeloma  . Hypertension Father   . Stroke Father   . Cancer Maternal Grandmother     CLL   History  Substance Use Topics  . Smoking status: Never Smoker   . Smokeless tobacco: Not on file  . Alcohol Use: No   Current Outpatient Prescriptions  Medication Sig Dispense  Refill  . acetaminophen (TYLENOL) 325 MG tablet Take 650 mg by mouth every 6 (six) hours as needed.    Marland Kitchen albuterol (PROVENTIL HFA) 108 (90 BASE) MCG/ACT inhaler Inhale 2 puffs into the lungs every 4 (four) hours as needed. 3 Inhaler 0  . amitriptyline (ELAVIL) 25 MG tablet Take 50 mg by mouth at bedtime.     . Biotin 2500 MCG CAPS Take by mouth.    . Docosahexaenoic Acid (PRENATAL DHA PO) Take by mouth.    . drospirenone-ethinyl estradiol (GIANVI) 3-0.02 MG tablet Take 1 tablet by mouth daily.    Marland Kitchen FERROUS SULFATE PO Take 65 mg by mouth.    . Flaxseed, Linseed, (FLAX SEED OIL PO) Take by mouth.    . fluticasone (FLONASE) 50 MCG/ACT nasal spray Place 2 sprays into both nostrils daily. Please give 90 day supply 16 g 3  . LORazepam (ATIVAN) 0.5 MG tablet Take 0.5 mg by mouth every 8 (eight) hours as needed for anxiety (uses rarely for panic disorder).    . meclizine (ANTIVERT) 12.5 MG tablet Take 12.5 mg by mouth 3 (three) times daily as needed for dizziness.    Marland Kitchen omeprazole (PRILOSEC) 20 MG capsule Take 20 mg by mouth daily.    . Prenatal Vit-Fe Fumarate-FA (PRENATAL MULTIVITAMIN) TABS tablet Take 1 tablet by mouth daily at 12 noon.    Marland Kitchen  ranitidine (ACID REDUCER) 150 MG tablet Take 150 mg by mouth 2 (two) times daily.    . rizatriptan (MAXALT-MLT) 10 MG disintegrating tablet Take 1 tablet (10 mg total) by mouth as needed for migraine. May repeat in 2 hours if needed 9 tablet 11  . topiramate (TOPAMAX) 100 MG tablet Take 1 tablet (100 mg total) by mouth daily. 90 tablet 3   No current facility-administered medications for this visit.   No Known Allergies   Review of Systems: Gen: Denies any fever, chills, sweats, anorexia, fatigue, weakness, malaise, weight loss, and sleep disorder CV: Denies chest pain, angina, palpitations, syncope, orthopnea, PND, peripheral edema, and claudication. Resp: Denies dyspnea at rest, dyspnea with exercise, cough, sputum, wheezing, coughing up blood, and  pleurisy. GI: Denies vomiting blood, jaundice, and fecal incontinence.   Denies dysphagia or odynophagia. GU : Denies urinary burning, blood in urine, urinary frequency, urinary hesitancy, nocturnal urination, and urinary incontinence. MS: Denies joint pain, limitation of movement, and swelling, stiffness, low back pain, extremity pain. Denies muscle weakness, cramps, atrophy.  Derm: Denies rash, itching, dry skin, hives, moles, warts, or unhealing ulcers.  Psych: Denies depression, anxiety, memory loss, suicidal ideation, hallucinations, paranoia, and confusion. Heme: Denies bruising, bleeding, and enlarged lymph nodes. Neuro:  Denies any headaches, dizziness, paresthesias. Endo:  Denies any problems with DM, thyroid, adrenal function   LAB RESULTS: Blood work on 03/08/2014 is has follows Lipase 65 Lipid profile cholesterol 150, triglycerides 57, HDL 63.2, LDL 75. CBC white blood count 4.1, hemoglobin 13.9, hematocrit 42.3, platelets 246,000. Hemoglobin A1c 5.2   Physical Exam: BP 98/58 mmHg  Pulse 72  Ht 5' 4.75" (1.645 m)  Wt 141 lb 8 oz (64.184 kg)  BMI 23.72 kg/m2  LMP 03/08/2014 Constitutional: Pleasant,well-developed, well nourished female in no acute distress. HEENT: Normocephalic and atraumatic. Conjunctivae are normal. No scleral icterus. Neck supple.no thyromegaly  Cardiovascular: Normal rate, regular rhythm.  Pulmonary/chest: Effort normal and breath sounds normal. No wheezing, rales or rhonchi. Abdominal: Soft, nondistended, nontender. Bowel sounds active throughout. There are no masses palpable. No hepatomegaly. Extremities: no edema Lymphadenopathy: No cervical adenopathy noted. Neurological: Alert and oriented to person place and time. Skin: Skin is warm and dry. No rashes noted. Psychiatric: Normal mood and affect. Behavior is normal.  ASSESSMENT AND PLAN: #1. Elevated lipase. Per the patient her lipase has been mildly elevated for 5-6 years. She has been  asymptomatic with no abdominal pain, nausea, vomiting, or weight loss. A lipase, amylase, comprehensive metabolic panel, and CBC will be obtained. An ultrasound of the abdomen will also be obtained to evaluate the biliary system. She does not appear to have pancreatitis, renal failure, acute cholecystitis,or any bowel obstruction that may account for her elevated lipase. She has no history of HIV disease or celiac disease she has been on oral contraceptives for years and this can sometimes cause an elevation of lipase alone as may other medications. Further recommendations will be made pending the findings of her repeat lab work and ultrasound. #2 GERD. She will continue an anti-reflux regimen and continue her omeprazole. #3 history of irritable bowel syndrome. She has been doing well on fiber supplementation and will continue this regimen.    Markeda Narvaez, Vita Barley PA-C 03/22/2014, 3:53 PM

## 2014-03-23 NOTE — Progress Notes (Signed)
Reviewed and agree with management. Syriah Delisi D. Evy Lutterman, M.D., FACG  

## 2014-03-28 ENCOUNTER — Ambulatory Visit (HOSPITAL_COMMUNITY)
Admission: RE | Admit: 2014-03-28 | Discharge: 2014-03-28 | Disposition: A | Discharge status: Home / Self Care | Payer: BC Managed Care – PPO | Source: Ambulatory Visit | Attending: Physician Assistant | Admitting: Physician Assistant

## 2014-03-28 DIAGNOSIS — R748 Abnormal levels of other serum enzymes: Secondary | ICD-10-CM | POA: Diagnosis not present

## 2014-03-31 ENCOUNTER — Ambulatory Visit: Payer: BC Managed Care – PPO | Admitting: Neurology

## 2014-04-01 ENCOUNTER — Other Ambulatory Visit: Payer: Self-pay | Admitting: *Deleted

## 2014-04-01 DIAGNOSIS — R748 Abnormal levels of other serum enzymes: Secondary | ICD-10-CM

## 2014-04-04 ENCOUNTER — Encounter: Payer: Self-pay | Admitting: Neurology

## 2014-04-04 ENCOUNTER — Ambulatory Visit (INDEPENDENT_AMBULATORY_CARE_PROVIDER_SITE_OTHER): Payer: BC Managed Care – PPO | Admitting: Neurology

## 2014-04-04 VITALS — BP 124/74 | HR 84 | Ht 64.25 in | Wt 142.0 lb

## 2014-04-04 DIAGNOSIS — R519 Headache, unspecified: Secondary | ICD-10-CM

## 2014-04-04 DIAGNOSIS — F0781 Postconcussional syndrome: Secondary | ICD-10-CM

## 2014-04-04 DIAGNOSIS — R42 Dizziness and giddiness: Secondary | ICD-10-CM

## 2014-04-04 DIAGNOSIS — R51 Headache: Secondary | ICD-10-CM

## 2014-04-04 MED ORDER — DICLOFENAC POTASSIUM(MIGRAINE) 50 MG PO PACK: PACK | ORAL | Status: DC

## 2014-04-04 MED ORDER — AMITRIPTYLINE HCL 25 MG PO TABS: ORAL_TABLET | ORAL | Status: DC

## 2014-04-04 NOTE — Progress Notes (Signed)
NEUROLOGY FOLLOW UP OFFICE NOTE  Kelsey [name] [id number]  HISTORY OF PRESENT ILLNESS: I had the pleasure of seeing Kelsey [name] in follow-up in the neurology clinic on 04/04/2014.  The patient was last seen 3 months ago for chronic daily headaches since a concussion in June 2014.  On her initial visit, she had started Topamax but had side effects of brain freeze and panic attacks that resolved after discontinuing the medication. She brings her headache diary, with an average of 1-3 severe headaches a week, but continues to have a 4 to 5 over 10 daily headache in between. She has taken the Maxalt every 2 weeks on average, with good effect. She had increased amitriptyline back to 29m qhs (noticed worsening headaches on 246m, with no side effects. Since moving to [place], she has noticed more visual difficulties when going into a grocery store, similar to prior symptoms where vestibular therapy did help.  HPI:  This is a pleasant [age]o RH woman with a history of vestibular neuritis with chronic intermittent vertigo, GERD, IBS, and post-concussive headaches. [place] [facility] In June 2014, she woke up early morning feeling flushed with GI upset, went to the bathroom and passed out, waking up on the bathroom floor with cuts around her left eye, requiring 12 stitches. A week later, she was back to work then felt that the building was rocking back and forth. She was diagnosed with a concussion and did a month of physical and cognitive rest. She started having daily headaches described as a dull constant pressure with specific triggers, particularly computer screens, busy environments like grocery stores, bright environments, focusing on crafts. Worst headaches are stabbing/throbbing pains in the back/right side of the head with dizziness, tightening sensation at the crown or left side of the head. There is occasional nausea, photo and phonophobia. Headaches  worse with looking at screens or long periods of concentration. When bad headaches start, they will often last all day. She has continuous 6 or 7 over 10 pain with exacerbations 5-6 times a week up to 9 or 10 over 10. Once a week she will wake up with bad headache. No aura symptoms such as visual changes, however she feels that her vision has been blurred. She has seen neuro-ophthalmology and cleared from their standpoint, prescribed prescription lenses that helped with the blurred vision but not the headaches. She has been taking amitriptyline 5037mhs for IBS. This was reduced to 49m58ms last month, with note of increase in headaches and worse sleep. She has not taken any over the counter pain medications since May. She had noted some word-finding difficulties after the concussion and [place] is okay per testing. There is a family history of migraines in her uncle, her mother has headaches.  She continues to have occasional vertigo when lying in bed. Turning her head does not help. She has stopped taking meclizine. She did vestibular therapy in [place] for several months, with eye tracking and vestibular exercises. She reports a history of panic attacks and has used ativan in the past. She had mild depression in the past. She has occasional tingling in both hands. She reports weakness in both legs, no paresthesias, she has fallen down the stairs twice and hit her head against the wall, no loss of conscioiusness.  [facility] [place] [facility] 12/[facility]: MRI OF THE BRAIN WITHOUT INTRAVENOUS CONTRAST Impression:  Unremarkable MRI of the brain.  MRV Brain:  IMPRESSION: No evidence for cortical or dural venous thrombosis.  PAST MEDICAL HISTORY: Past Medical History  Diagnosis Date  . Asthma   . Depression   . GERD (gastroesophageal reflux disease)   . Frequent headaches   . Migraines   . IBS (irritable bowel  syndrome)   . Panic attacks   . Vertigo 12/31/2013  . Anxiety and depression     MEDICATIONS: Current Outpatient Prescriptions on File Prior to Visit  Medication Sig Dispense Refill  . acetaminophen (TYLENOL) 325 MG tablet Take 650 mg by mouth every 6 (six) hours as needed.    Marland Kitchen albuterol (PROVENTIL HFA) 108 (90 BASE) MCG/ACT inhaler Inhale 2 puffs into the lungs every 4 (four) hours as needed. 3 Inhaler 0  . amitriptyline (ELAVIL) 25 MG tablet Take 50 mg by mouth at bedtime.     . Biotin 2500 MCG CAPS Take by mouth.    . Docosahexaenoic Acid (PRENATAL DHA PO) Take by mouth.    . drospirenone-ethinyl estradiol (GIANVI) 3-0.02 MG tablet Take 1 tablet by mouth daily.    Marland Kitchen FERROUS SULFATE PO Take 65 mg by mouth.    . Flaxseed, Linseed, (FLAX SEED OIL PO) Take by mouth.    . fluticasone (FLONASE) 50 MCG/ACT nasal spray Place 2 sprays into both nostrils daily. Please give 90 day supply 16 g 3  . LORazepam (ATIVAN) 0.5 MG tablet Take 0.5 mg by mouth every 8 (eight) hours as needed for anxiety (uses rarely for panic disorder).    . meclizine (ANTIVERT) 12.5 MG tablet Take 12.5 mg by mouth 3 (three) times daily as needed for dizziness.    Marland Kitchen omeprazole (PRILOSEC) 20 MG capsule Take 20 mg by mouth daily.    . Prenatal Vit-Fe Fumarate-FA (PRENATAL MULTIVITAMIN) TABS tablet Take 1 tablet by mouth daily at 12 noon.    . ranitidine (ACID REDUCER) 150 MG tablet Take 150 mg by mouth 2 (two) times daily.    . rizatriptan (MAXALT-MLT) 10 MG disintegrating tablet Take 1 tablet (10 mg total) by mouth as needed for migraine. May repeat in 2 hours if needed 9 tablet 11  .       No current facility-administered medications on file prior to visit.    ALLERGIES: No Known Allergies  FAMILY HISTORY: Family History  Problem Relation Age of Onset  . Hypertension Mother   . Cancer Mother     multiple myeloma  . Hypertension Father   . Stroke Father   . Cancer Maternal Grandmother     CLL    SOCIAL  HISTORY: History   Social History  . Marital Status: Married    Spouse Name: N/A    Number of Children: N/A  . Years of Education: N/A   Occupational History  . Not on file.   Social History Main Topics  . Smoking status: Never Smoker   . Smokeless tobacco: Not on file  . Alcohol Use: No  . Drug Use: No  . Sexual Activity: Not on file   Other Topics Concern  . 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      Home Situation: lives with husband      Spiritual Beliefs: Christian      Lifestyle: doing 20 minutes of exercise and a little weight lifting daily; diet is healthy  REVIEW OF SYSTEMS: Constitutional: No fevers, chills, or sweats, no generalized fatigue, change in appetite Eyes: No visual changes, double vision, eye pain Ear, nose and throat: No hearing loss, ear pain, nasal congestion, sore throat Cardiovascular: No chest pain, palpitations Respiratory:  No shortness of breath at rest or with exertion, wheezes GastrointestinaI: No nausea, vomiting, diarrhea, abdominal pain, fecal incontinence Genitourinary:  No dysuria, urinary retention or frequency Musculoskeletal:  No neck pain, back pain Integumentary: No rash, pruritus, skin lesions Neurological: as above Psychiatric: No depression, +insomnia, anxiety Endocrine: No palpitations, fatigue, diaphoresis, mood swings, change in appetite, change in weight, increased thirst Hematologic/Lymphatic:  No anemia, purpura, petechiae. Allergic/Immunologic: no itchy/runny eyes, nasal congestion, recent allergic reactions, rashes  PHYSICAL EXAM: Filed Vitals:   04/04/14 0834  BP: 124/74  Pulse: 84   General: No acute distress Head:  Normocephalic/atraumatic Neck: supple, no paraspinal tenderness, full range of motion Heart:  Regular rate and rhythm Lungs:  Clear to auscultation bilaterally Back: No paraspinal tenderness Skin/Extremities: No rash, no  edema Neurological Exam: alert and oriented to person, place, and time. No aphasia or dysarthria. Fund of knowledge is appropriate.  Recent and remote memory are intact.  Attention and concentration are normal.    Able to name objects and repeat phrases. Cranial nerves: Pupils equal, round, reactive to light.  Fundoscopic exam unremarkable, no papilledema. Extraocular movements intact with no nystagmus. Visual fields full. Facial sensation intact. No facial asymmetry. Tongue, uvula, palate midline.  Motor: Bulk and tone normal, muscle strength 5/5 throughout with no pronator drift.  Sensation to light touch intact.  No extinction to double simultaneous stimulation.  Deep tendon reflexes 2+ throughout, toes downgoing.  Finger to nose testing intact.  Gait narrow-based and steady, able to tandem walk adequately.  Romberg negative.  IMPRESSION: This is a pleasant 33 yo RH woman with a history of vestibular neuritis with chronic intermittent vertigo, GERD, IBS, and post-concussive headaches. She continues to have chronic daily headaches, with side effects on Topamax. We discussed options of adding a second agent for headache prophylaxis versus increasing dose of amitriptyline. She is agreeable to increasing amitriptyline dose to 57m qhs for 2 weeks, then 1050mqhs. Side effects were discussed. Other options for headache prophylaxis include Atenolol, Zonisamide, or Zoloft. She has had good effect from Maxalt and will continue to use this prn for severe headaches. She is asking for other rescue medications to take for less severe headaches that also affect daily activities, and will do a trial of Cambia prn. We discussed rebound headaches, she knows to minimize any rescue medication to 2-3 a week to avoid rebound headaches. She has noticed a worsening of visual adaptation with dizziness when going into a grocery store. Vestibular therapy helped in the past, she will be referred for vestibular therapy. She will  continue to keep a headache diary and follow-up in 2-3 months.  Thank you for allowing me to participate in her care.  Please do not hesitate to call for any questions or concerns.  The duration of this appointment visit was 25 minutes of face-to-face time with the patient.  Greater than 50% of this time was spent in counseling, explanation of diagnosis, planning of further management, and coordination of care.   KaEllouise NewerM.D.   CC: Dr. KiMaudie Mercury

## 2014-04-04 NOTE — Patient Instructions (Addendum)
1. Increase Amitriptyline to $RemoveBefor100mg  at bedtime. Monitor for daytime drowsiness with increasing dose. 2. Start vestibular therapy 3. Other options for headache prophylaxis/prevention include: Atenolol, Zonisamide, and Zoloft. 4. Continue to keep headache diary 5. Follow-up in 6 weeks, call for any problems

## 2014-04-05 ENCOUNTER — Ambulatory Visit: Payer: BC Managed Care – PPO

## 2014-04-08 ENCOUNTER — Encounter: Payer: Self-pay | Admitting: *Deleted

## 2014-04-26 ENCOUNTER — Encounter: Payer: Self-pay | Admitting: Rehabilitative and Restorative Service Providers"

## 2014-04-26 ENCOUNTER — Ambulatory Visit: Payer: BC Managed Care – PPO | Attending: Neurology | Admitting: Rehabilitative and Restorative Service Providers"

## 2014-04-26 DIAGNOSIS — R42 Dizziness and giddiness: Secondary | ICD-10-CM

## 2014-04-26 NOTE — Therapy (Signed)
[facility] [address] [place], [place] [address] Phone: [phone]   Fax:  [phone]  Physical Therapy Evaluation  Patient Details  Name: Sabino DickKimberly Sweetin MRN: [id number] Date of Birth: [birth year]  Encounter Date: 04/26/2014      PT End of Session - 04/26/14 0941    Visit Number 1   Number of Visits 10   Date for PT Re-Evaluation 05/26/14   Authorization Type [employer] [place] [employer]   PT Start Time 0850   PT Stop Time 0935   PT Time Calculation (min) 45 min   Activity Tolerance Patient tolerated treatment well      Past Medical History  Diagnosis Date  . Asthma   . Depression   . GERD (gastroesophageal reflux disease)   . Frequent headaches   . Migraines   . IBS (irritable bowel syndrome)   . Panic attacks   . Vertigo 12/31/2013  . Anxiety and depression     History reviewed. No pertinent past surgical history.  There were no vitals taken for this visit.  Visit Diagnosis:  Dizziness and giddiness      Subjective Assessment - 04/26/14 0854    Symptoms The patient is a 33 year old female s/p concussion in 10/2012 after passing out and hitting her head during a time of illness.  The patient noted symptoms of room spinning and headaches approximately 2 weeks after the initial injury.  She currently still experiences room spinning episodes when lying down and was tested for BPPV in the past, but negative for testing.  She describes variable symptoms of dizziness reporting 7-8/10 with room spinning.  It doesn't feel "precisely like spinning, but motion in many directions".  This can last up to 10 minutes.  She does not typically experience these symtpoms when standing.  She continues with balance exercises and has variability in performance.     Patient Stated Goals Returning to work.  Feels most hindered by car sickness/motion sickness and headaches.     Currently in Pain? Yes   Pain Score 6    Pain Location Head   Pain  Orientation Other (Comment)  left; superior aspect of head   Pain Descriptors / Indicators Pressure   Pain Type Chronic pain   Pain Onset More than a month ago   Pain Frequency Intermittent  usually some headache present, but varies   Aggravating Factors  photosensitive, hypersensitive to sounds, busy environments   Pain Relieving Factors quiet, dark environments   Multiple Pain Sites No          [facility] PT Assessment - 04/26/14 0001    Assessment   Medical Diagnosis post concussive syndrome  "has gotten better" since onset   Onset Date --  10/2012   Prior Therapy vestibular rehab exercise 09/2013   Balance Screen   Has the patient fallen in the past 6 months Yes   How many times? --  2   Has the patient had a decrease in activity level because of a fear of falling?  No   Is the patient reluctant to leave their home because of a fear of falling?  No   Home Environment   Living Enviornment Private residence   Living Arrangements Spouse/significant other   Available Help at Discharge Family   Type of Home House   Home Access Stairs to enter   Home Layout Two level  has fallen down stairs multiple times   Prior Function   Vocation Full time employment  worked  as Warden/ranger, now unable to focus on computers   Vocation Requirements --  now working from home   Cognition   Behaviors --  anxiety disorder, panic attacks more since injury       NEUROMUSCULAR RE-EDUCATION: Gaze x 1 viewing instructed in horizontal and vertical planes for vestibular adaptation.      PT Education - 04/26/14 0940    Education provided Yes   Education Details HEP: gaze x 1 viewing horiz/vertical planes   Person(s) Educated Patient   Methods Explanation;Demonstration;Verbal cues;Handout   Comprehension Returned demonstration;Verbalized understanding          PT Short Term Goals - 04/26/14 0948    PT SHORT TERM GOAL #1   Title The patient will return demo HEP for gaze, habituation for  motion sensitivity, and dynamic balance activities (05/26/2014)   Time 4   Period Weeks   Status New   PT SHORT TERM GOAL #2   Title The patient will tolerate gaze x 1 viewing x 30 seconds with dizziness < or equal to 2/10 to demo improved tolerance to movement (05/26/2014).   Time 4   Period Weeks   Status New   PT SHORT TERM GOAL #3   Title The patient will be further assessed on sensory organization test and improve by 10% on equilibrium score (05/26/2014)   Time 4   Status New   PT SHORT TERM GOAL #4   Title The patient will verbalize understanding of home walking program (05/26/2014)   Time 4   Period Weeks   Status New          PT Long Term Goals - 04/26/14 5409    PT LONG TERM GOAL #1   Title The patient will decrease DHI by 15% to demo improved self perception of deficits (06/25/2014).   Time 8   Period Weeks   Status New   PT LONG TERM GOAL #2   Title The pateint will tolerate gaze x 1 viewing x 60 seconds with dizziness < or equal to 2/10 (06/25/2014).   Time 8   Period Weeks   Status New   PT LONG TERM GOAL #3   Title The patient will have a 3 line or less difference on SVA vs DVA (06/25/2014)   Time 8   Period Weeks   Status New   PT LONG TERM GOAL #4   Title The patient will tolerate computer activities x 30 minutes reporting symptoms of HA and dizziness < 2/10 change from baseline (06/25/2014).   Time 8   Period Weeks   Status New          Plan - 04/26/14 0944    Clinical Impression Statement The patient is a 33 yo female presenting with multi-factorial dizziness, headaches.  She reports h/o post concussive symptoms, compounded by h/o anxiety/panic attacks and visual changes since concussion.  She also experiences lightheadedness with return to sitting (PT to further assess for orthostatic hypotension).  PT to focus on provoking activities that elicit mild to moderate symptoms in order to improve vestibular adaptation and also instruct in central compensatory  strategies for improved ability to function in busy environments.   Pt will benefit from skilled therapeutic intervention in order to improve on the following deficits Impaired vision/preception;Decreased mobility;Decreased activity tolerance;Decreased balance;Other (comment)  headaches, intolerance of busy environments   Rehab Potential Good   Clinical Impairments Affecting Rehab Potential h/o panic/anxiety attacks exacerbated by concussion   PT Frequency 2x / week  PT Duration 2 weeks  followed by 1x/week for 6 weeks, as indicated   PT Treatment/Interventions Therapeutic activities;Functional mobility training;Balance training;Gait training;ADLs/Self Care Home Management;Patient/family education;Therapeutic exercise;Neuromuscular re-education   PT Next Visit Plan Check orthostatics, check gaze x 1 viewing, sensory organization testing.  Add further HEP for multi-sensory environments.   PT Home Exercise Plan progress to tolerance monitoring headaches.   Consulted and Agree with Plan of Care Patient         Vestibular Assessment - 04/26/14 0904    Type of Dizziness Spinning  with lying down, driving provokes nausea   Frequency of Dizziness --  daily   Duration of Dizziness --  minutes   Aggravating Factors Driving;Lying supine   Occulomotor Alignment Normal   Spontaneous Absent   Gaze-induced Absent   Comment head thrust test positive to R greater than L.  Corrective saccade noted to both sides   VOR 1 Head Only (x 1 viewing) --  slow pace WNLs gaze with mild dizziness noted   VOR 2 Head and Object (x 2 viewing) --  x 5 seconds able to maintain gaze with mild symptoms   VOR to Slow Head Movement Normal   VOR Cancellation Normal   Dynamic 4 line difference indicating decreased use of VOR   Dix-Hallpike Dix-Hallpike Right   Sidelying Test Sidelying Right;Sidelying Left   Horizontal Canal Testing Horizontal Canal Right;Horizontal Canal Left   Dix-Hallpike Right Duration --  No  dizziness or nystagmus provoked   Dix-Hallpike Right Symptoms No nystagmus   Sidelying Right Duration --  no symptoms provoked   Sidelying Left Duration no symptoms provoked   Horizontal Canal Right Symptoms Normal   Horizontal Canal Left Symptoms Normal   Supine to Left Side No dizziness  1 with return to sitting   Supine to Right Side No dizziness  1 with return to sitting   Supine to Sitting Lightheadedness   Right Hallpike No dizziness  0 for left hallpike as well   Up from Right Hallpike Lightheadedness   Up from Left Hallpike Lightheadedness   Positional Sensitivities Comments --  3/10 dizziness with 5 horiz turns in 8.3 sec, 3/10 vertical     Positional testing demonstrates motion sensitivity but no true BPPV noted at today's evaluation.    Problem List Patient Active Problem List   Diagnosis Date Noted  . Asthma, mild persistent 03/08/2014  . GERD (gastroesophageal reflux disease) 03/08/2014  . Seasonal allergies 03/08/2014  . IBS (irritable bowel syndrome) 03/08/2014  . Anxiety and depression 03/08/2014  . Chronic daily headache 12/31/2013  . Vertigo 12/31/2013  . Postconcussive syndrome 12/31/2013     Thank you for the referral of this patient.   Margretta Dittyhristina Alisandra Son, PT, MPT 04/26/2014 9:56 AM [facility] Phone: [phone]) [phone] Fax: [phone]   Zanita Millman 04/26/2014, 9:54 AM

## 2014-04-26 NOTE — Patient Instructions (Signed)
Gaze Stabilization: Standing Feet Apart   Feet shoulder width apart, keeping eyes on target on wall ___3-5_ feet away, tilt head down 15-30 and move head side to side for _20-30__ seconds. Repeat while moving head up and down for _20-30___ seconds. Do _2-3___ sessions per day. Repeat using target on pattern background. *Do with both sets of glasses.  Copyright  VHI. All rights reserved.  Gaze Stabilization: Tip Card 1.Target must remain in focus, not blurry, and appear stationary while head is in motion. 2.Perform exercises with small head movements (45 to either side of midline). 3.Increase speed of head motion so long as target is in focus. 4.If you wear eyeglasses, be sure you can see target through lens (therapist will give specific instructions for bifocal / progressive lenses). 5.These exercises may provoke dizziness or nausea. Work through these symptoms. If too dizzy, slow head movement slightly. Rest between each exercise. 6.Exercises demand concentration; avoid distractions. 7.For safety, perform standing exercises close to a counter, wall, corner, or next to someone.  Copyright  VHI. All rights reserved.  Special Instructions: Exercises may bring on mild to moderate symptoms of headache and dizziness that resolve within 30 minutes of completing exercises. If symptoms are lasting longer than 30 minutes, modify your exercises by:  >decreasing the # of times you complete each activity >ensuring your symptoms return to baseline before moving onto the next exercise >dividing up exercises so you do not do them all in one session, but multiple short sessions throughout the day >doing them once a day until symptoms improve

## 2014-05-04 ENCOUNTER — Encounter: Payer: Self-pay | Admitting: Rehabilitative and Restorative Service Providers"

## 2014-05-04 ENCOUNTER — Ambulatory Visit: Payer: BC Managed Care – PPO | Admitting: Rehabilitative and Restorative Service Providers"

## 2014-05-04 DIAGNOSIS — R42 Dizziness and giddiness: Secondary | ICD-10-CM

## 2014-05-04 NOTE — Patient Instructions (Signed)
Walking Program:  Begin walking for exercise for 20 minutes, 1 times/day, 3 days/week.   Progress your walking program by adding 3-4 minutes to your routine each week, as tolerated. Be sure to wear good walking shoes, walk in a safe environment and only progress to your tolerance.       Goal: Return to exercise 30 minutes at least 3 x/week.   Margretta Dittyhristina Kasie Leccese, PT, MPT  10:17 AM [facility] Phone: [phone]) [phone] Fax: [phone]

## 2014-05-04 NOTE — Therapy (Signed)
[facility] [address] [place], [place], [address] Phone: [phone]   Fax:  [phone]  Physical Therapy Treatment  Patient Details  Name: Kelsey [name] MRN: [id number] Date of Birth: [birth year]  Encounter Date: 05/04/2014      PT End of Session - 05/04/14 1322    Visit Number 2   Number of Visits 10   Date for PT Re-Evaluation 05/26/14   Authorization Type [employer] [place] PPO   PT Start Time 0935   PT Stop Time 1020   PT Time Calculation (min) 45 min   Activity Tolerance Patient tolerated treatment well      Past Medical History  Diagnosis Date  . Asthma   . Depression   . GERD (gastroesophageal reflux disease)   . Frequent headaches   . Migraines   . IBS (irritable bowel syndrome)   . Panic attacks   . Vertigo 12/31/2013  . Anxiety and depression     History reviewed. No pertinent past surgical history.  There were no vitals taken for this visit.  Visit Diagnosis:  Dizziness and giddiness      Subjective Assessment - 05/04/14 0935    Symptoms Driving is still very disorienting, "it is stressful".  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   HEP is going well with regular lenses, hard with reading glasses.    Currently in Pain? Yes   Pain Score 3    Pain Location Head   Pain Descriptors / Indicators Pressure   Pain Type Chronic pain   Pain Onset More than a month ago   Pain Frequency Intermittent   Aggravating Factors  photosensitive, worse with driving         Plan - 13/24/4012/16/15 1009    Clinical Impression Statement The patient scores WNLs for overall sensory organization test.  PT added home walking to return to physical activity.     PT Next Visit Plan Add HEP for multi-sensory/ visually stimulating environments; perform motion program for car tasks.   Consulted and Agree with Plan of Care Patient      NEUROMUSCULAR RE-EDUCATION: Reviewed gaze x 1 viewing from HEP.  Pt with visual distortion at rest and c/o visual blurring with head movement.  PT recommended she continue at slowed pace.  Performed sensory organization testing with 73% composite equilibrium score with diminished use of somatosensory feedback noted, and WNLs use of vestibular inputs for balance.  SELF CARE/HOME MANAGEMENT: Home walking program, return to computer use and home walking program discussed.        Vestibular Assessment - 05/04/14 0945    BP supine (x 5 minutes) 108/80 mmHg   HR supine (x 5 minutes) 94   BP standing (after 1 minute) 90/80 mmHg   HR standing (after 1 minute) 109   BP standing (after 3 minutes) 98/80 mmHg   HR standing (after 3 minutes) 114   Orthostatics Comment --  Pt reports "moderate" sensation of lightheadedness c stand        Problem List Patient Active Problem List   Diagnosis Date Noted  . Asthma, mild persistent 03/08/2014  . GERD (gastroesophageal reflux disease) 03/08/2014  . Seasonal allergies 03/08/2014  . IBS (irritable bowel syndrome) 03/08/2014  . Anxiety and depression 03/08/2014  . Chronic daily headache 12/31/2013  . Vertigo 12/31/2013  . Postconcussive syndrome 12/31/2013     Margretta Dittyhristina Martell Mcfadyen, PT,  MPT 05/04/2014 1:39 PM [facility] Phone: [phone] Fax: [phone]  Kelsey [name] 05/04/2014, 1:38 PM

## 2014-05-05 ENCOUNTER — Ambulatory Visit: Payer: BC Managed Care – PPO | Admitting: Rehabilitative and Restorative Service Providers"

## 2014-05-05 ENCOUNTER — Encounter: Payer: Self-pay | Admitting: Rehabilitative and Restorative Service Providers"

## 2014-05-05 DIAGNOSIS — R42 Dizziness and giddiness: Secondary | ICD-10-CM | POA: Diagnosis not present

## 2014-05-05 NOTE — Patient Instructions (Signed)
Head Motion: Side to Side   Sitting, tilt head down 15-30, slowly move head to right with eyes open. Hold position until symptoms subside. Then, move head slowly to opposite side. Hold position until symptoms subside. Repeat _5___ times per session. Do _2-3___ sessions per day.  Copyright  VHI. All rights reserved.  Gaze Stabilization: Sitting   Hold card at arms length and move head side to side while moving the card in an opposite direction from your head.  Your eyes should follow the card.  Do this 10 repetitions and repeat 2 times per day if tolerable.  Copyright  VHI. All rights reserved.    Computer screens:   Create a graphic moves across the screen x 30 seconds.  Allow to move side to side and up and down.  Computer screens: Scroll through a 5 page document up/down. Begin with 5 repetitions.  Rest.  Repeat if you are able.   Special Instructions: Exercises may bring on mild to moderate symptoms of headache, dizziness that resolve within 30 minutes of completing exercises. If symptoms are lasting longer than 30 minutes, modify your exercises by:  >decreasing the # of times you complete each activity >ensuring your symptoms return to baseline before moving onto the next exercise >dividing up exercises so you do not do them all in one session, but multiple short sessions throughout the day >doing them once a day until symptoms improve

## 2014-05-05 NOTE — Therapy (Signed)
[facility] [address] [place], [place], [address] Phone: [phone]   Fax:  [phone]  Physical Therapy Treatment  Patient Details  Name: Kelsey [name] MRN: [id number] Date of Birth: 08-Jul-1980  Encounter Date: 05/05/2014      PT End of Session - 05/05/14 1519    Visit Number 3   Number of Visits 10   Date for PT Re-Evaluation 05/26/14   Authorization Type [employer] [place] PPO   PT Start Time 0850   PT Stop Time 0940   PT Time Calculation (min) 50 min   Activity Tolerance Patient tolerated treatment well      Past Medical History  Diagnosis Date  . Asthma   . Depression   . GERD (gastroesophageal reflux disease)   . Frequent headaches   . Migraines   . IBS (irritable bowel syndrome)   . Panic attacks   . Vertigo 12/31/2013  . Anxiety and depression     History reviewed. No pertinent past surgical history.  There were no vitals taken for this visit.  Visit Diagnosis:  Dizziness and giddiness      Subjective Assessment - 05/05/14 0854    Symptoms The patient reports that driving in rain is more challenging.  Awoke with headache today.   Currently in Pain? Yes   Pain Score 7    Pain Location Head   Pain Descriptors / Indicators Sharp;Shooting   Pain Type Chronic pain   Pain Onset More than a month ago   Pain Frequency Intermittent   Aggravating Factors  worse with driving, awoke with pain today   Pain Relieving Factors quiet, dark environments.   Multiple Pain Sites No     NEUROMUSCULAR RE-EDUCATION: Seated x 2 viewing Seated habituation to mimic driving  Optokinetic nystagmus in sitting  MANUAL: Suboccipital release with gentle manual distraction supine with soft tissue mobilization scalenes bilateral and suboccipitals.   SELF CARE/HOME MANAGEMENT: Discussed return to computer activities and instructed in progression of HEP via repetitions, and busy visual cues.      PT Education - 05/05/14 1519    Education provided Yes   Education Details HEP: x 2 viewing, habituation with head turns, visual tracking exercises on computer.   Person(s) Educated Patient   Methods Explanation;Demonstration;Handout   Comprehension Verbalized understanding;Returned demonstration          PT Short Term Goals - 05/05/14 1520    PT SHORT TERM GOAL #1   Title The patient will return demo HEP for gaze, habituation for motion sensitivity, and dynamic balance activities (05/26/2014)   Time 4   Period Weeks   Status On-going   PT SHORT TERM GOAL #2   Title The patient will tolerate gaze x 1 viewing x 30 seconds with dizziness < or equal to 2/10 to demo improved tolerance to movement (05/26/2014).   Time 4   Period Weeks   Status On-going   PT SHORT TERM GOAL #3   Title The patient will be further assessed on sensory organization test and improve by 10% on equilibrium score (05/26/2014)   Time 4   Status On-going   PT SHORT TERM GOAL #4   Title The patient will verbalize understanding of home walking program (05/26/2014)   Time 4   Period Weeks   Status On-going          PT Long Term Goals - 05/05/14 1520    PT LONG TERM GOAL #1   Title The patient will decrease DHI by 15%  to demo improved self perception of deficits (06/25/2014).   Time 8   Period Weeks   Status On-going   PT LONG TERM GOAL #2   Title The pateint will tolerate gaze x 1 viewing x 60 seconds with dizziness < or equal to 2/10 (06/25/2014).   Time 8   Period Weeks   Status On-going   PT LONG TERM GOAL #3   Title The patient will have a 3 line or less difference on SVA vs DVA (06/25/2014)   Time 8   Period Weeks   Status On-going   PT LONG TERM GOAL #4   Title The patient will tolerate computer activities x 30 minutes reporting symptoms of HA and dizziness < 2/10 change from baseline (06/25/2014).   Time 8   Period Weeks   Status On-going          Plan - 05/05/14 1522    Clinical Impression Statement Patient with significant  symptoms with vertical scrolling and optokinetic stimulus.  PT addressing visual complaints through HEP.   PT Next Visit Plan Cervical home stretching with towel distraction; supine stabilization exercises; scale stretching; f/u on HEP.   Consulted and Agree with Plan of Care Patient         Problem List Patient Active Problem List   Diagnosis Date Noted  . Asthma, mild persistent 03/08/2014  . GERD (gastroesophageal reflux disease) 03/08/2014  . Seasonal allergies 03/08/2014  . IBS (irritable bowel syndrome) 03/08/2014  . Anxiety and depression 03/08/2014  . Chronic daily headache 12/31/2013  . Vertigo 12/31/2013  . Postconcussive syndrome 12/31/2013    Margretta Dittyhristina Treyvone Chelf, PT, MPT 05/05/2014 3:24 PM [facility] Phone: [phone] Fax: [phone]  Kelsey [name] 05/05/2014, 3:24 PM

## 2014-05-09 ENCOUNTER — Ambulatory Visit: Payer: BC Managed Care – PPO | Admitting: Rehabilitative and Restorative Service Providers"

## 2014-05-10 ENCOUNTER — Encounter: Payer: BC Managed Care – PPO | Admitting: Rehabilitative and Restorative Service Providers"

## 2014-05-24 ENCOUNTER — Ambulatory Visit: Payer: 59 | Attending: Neurology | Admitting: Rehabilitative and Restorative Service Providers"

## 2014-05-24 ENCOUNTER — Encounter: Payer: Self-pay | Admitting: Rehabilitative and Restorative Service Providers"

## 2014-05-24 DIAGNOSIS — R42 Dizziness and giddiness: Secondary | ICD-10-CM | POA: Diagnosis not present

## 2014-05-24 NOTE — Therapy (Signed)
[facility] [address] [place], [place], [address] Phone: [phone]   Fax:  [phone]  Physical Therapy Treatment  Patient Details  Name: Kelsey [name] MRN: [id number] Date of Birth: [birth year]  Encounter Date: 05/24/2014      PT End of Session - 05/24/14 1357    Visit Number 4   Number of Visits 10   Date for PT Re-Evaluation 05/26/14   Authorization Type [employer] [place] PPO   PT Start Time 0933   PT Stop Time 1020   PT Time Calculation (min) 47 min   Activity Tolerance Patient tolerated treatment well      Past Medical History  Diagnosis Date  . Asthma   . Depression   . GERD (gastroesophageal reflux disease)   . Frequent headaches   . Migraines   . IBS (irritable bowel syndrome)   . Panic attacks   . Vertigo 12/31/2013  . Anxiety and depression     History reviewed. No pertinent past surgical history.  There were no vitals taken for this visit.  Visit Diagnosis:  Dizziness and giddiness      Subjective Assessment - 05/24/14 0940    Symptoms The patient reports baseline headaches improved to 4/10.  She still notes dizziness with driving and changes in lights.  She is experiencing more nausea than dizziness.  She went to the gym yesterday and felt dizzy, nauseous and tired after the gym.  She has increased walking in the neighborhood and tolerated that well.  The exercises continue to bring on moderate dizziness with repetition.   Currently in Pain? Yes   Pain Score 4    Pain Location Head   Pain Descriptors / Indicators Sharp;Shooting   Pain Type Chronic pain   Pain Onset More than a month ago   Pain Frequency Intermittent   Aggravating Factors  driving, light changes   Pain Relieving Factors quiet, dark environments   Multiple Pain Sites No     NEUROMUSCULAR RE-EDUCATION: Reviewed prior HEP of gaze x 1 viewing standing, seated gaze x 2 viewing, seated horizontal habituation with head turns, and  discussed use of computer to practice scrolling for habitatuon. Also discussed home walking program. 180 degree turns standing working on spotting objects and letting symptoms settle.  Gait: Discussed visual spatial awareness with gait due to patient c/o bumping into door frames on the right side. Also observed curbs, stairs due to c/o "missing steps" if not looking down.  PT and patient discussed spotting at top of staircase and practicing going up (near rail) and down repeatedly to improve spatial awareness/depth perception on home stairs.           PT Education - 05/24/14 1356    Education provided Yes   Education Details HEP: turns, compensatory strategies of visual spotting during turns and driving tasks.   Person(s) Educated Patient   Methods Explanation;Demonstration;Handout   Comprehension Verbalized understanding;Returned demonstration          PT Short Term Goals - 05/24/14 1358    PT SHORT TERM GOAL #1   Title The patient will return demo HEP for gaze, habituation for motion sensitivity, and dynamic balance activities (05/26/2014)   Baseline met on 05/24/2014   Time 4   Period Weeks   Status Achieved   PT SHORT TERM GOAL #2   Title The patient will tolerate gaze x 1 viewing x 30 seconds with dizziness < or equal to 2/10 to demo improved tolerance to movement (05/26/2014).  Time 4   Period Weeks   Status On-going   PT SHORT TERM GOAL #3   Title The patient will be further assessed on sensory organization test and improve by 10% on equilibrium score (05/26/2014)   Time 4   Status On-going   PT SHORT TERM GOAL #4   Title The patient will verbalize understanding of home walking program (05/26/2014)   Baseline met on 05/24/2014   Time 4   Period Weeks   Status Achieved           PT Long Term Goals - 05/05/14 1520    PT LONG TERM GOAL #1   Title The patient will decrease DHI by 15% to demo improved self perception of deficits (06/25/2014).   Time 8   Period Weeks    Status On-going   PT LONG TERM GOAL #2   Title The pateint will tolerate gaze x 1 viewing x 60 seconds with dizziness < or equal to 2/10 (06/25/2014).   Time 8   Period Weeks   Status On-going   PT LONG TERM GOAL #3   Title The patient will have a 3 line or less difference on SVA vs DVA (06/25/2014)   Time 8   Period Weeks   Status On-going   PT LONG TERM GOAL #4   Title The patient will tolerate computer activities x 30 minutes reporting symptoms of HA and dizziness < 2/10 change from baseline (06/25/2014).   Time 8   Period Weeks   Status On-going               Plan - 05/24/14 1400    Clinical Impression Statement The patient met 2 STGs.  PT progressing HEP and recommend continued performance of gaze activities, and general wellness activities (walking).  PT to f/u in 2 weeks to ensure HEP progressing well.   PT Next Visit Plan f/u on HEP for gaze, walking, and turns.  Add neck stretching and supine neck stabilization exercises next visit if needed.   Consulted and Agree with Plan of Care Patient        Problem List Patient Active Problem List   Diagnosis Date Noted  . Asthma, mild persistent 03/08/2014  . GERD (gastroesophageal reflux disease) 03/08/2014  . Seasonal allergies 03/08/2014  . IBS (irritable bowel syndrome) 03/08/2014  . Anxiety and depression 03/08/2014  . Chronic daily headache 12/31/2013  . Vertigo 12/31/2013  . Postconcussive syndrome 12/31/2013    Kelsey [name] 05/24/2014, 2:02 PM  [facility] [address] [place], [place], [address] Phone: [phone]   Fax:  [phone]

## 2014-05-24 NOTE — Patient Instructions (Signed)
Turning   Lead with head and eyes and slowly make a half turn to left with eyes open and then back to the right.  Hold position until symptoms subside. Repeat _5, working up to 10___ times per session. Do _1-2___ sessions per day.  *spot an object to improve tolerance to this exercise.  Copyright  VHI. All rights reserved.   *Modification to current HEP:  For sitting head turns, do not lean head forward, but rather perform a gentle chin tuck.  For the letter exercise (standing with letter on the door), only turn head 20 degrees and continue at a slow pace until visual blurring improves.  Also, remember to use visual spotting to improve mobility on the steps, and walking through door frames.  Visual spotting during driving and when in a busy environment can also improve tolerance to those activities.

## 2014-05-25 ENCOUNTER — Encounter: Payer: Self-pay | Admitting: Neurology

## 2014-05-25 ENCOUNTER — Ambulatory Visit: Payer: BC Managed Care – PPO | Admitting: Gastroenterology

## 2014-05-25 ENCOUNTER — Ambulatory Visit (INDEPENDENT_AMBULATORY_CARE_PROVIDER_SITE_OTHER): Payer: 59 | Admitting: Neurology

## 2014-05-25 VITALS — BP 122/84 | HR 114 | Resp 16 | Ht 64.25 in | Wt 144.0 lb

## 2014-05-25 DIAGNOSIS — R519 Headache, unspecified: Secondary | ICD-10-CM

## 2014-05-25 DIAGNOSIS — R51 Headache: Secondary | ICD-10-CM

## 2014-05-25 DIAGNOSIS — F0781 Postconcussional syndrome: Secondary | ICD-10-CM

## 2014-05-25 DIAGNOSIS — R42 Dizziness and giddiness: Secondary | ICD-10-CM

## 2014-05-25 NOTE — Patient Instructions (Addendum)
1. Start weaning off amitriptyline as discussed 2. Start daily magnesium 400mg  3. Start daily riboflavin 400mg   4. May try melatonin for sleep 5. Practice good sleep hygiene 6. Follow-up in 2 months

## 2014-05-25 NOTE — Progress Notes (Signed)
NEUROLOGY FOLLOW UP OFFICE NOTE  Kelsey [name] [id number]  HISTORY OF PRESENT ILLNESS: I had the pleasure of seeing Kelsey [name] in follow-up in the neurology clinic on 05/25/2014.  The patient was last seen 2 months ago for chronic daily headaches and dizziness since a concussion in June 2014. She did not tolerate Topamax. She was started on amitriptyline, and dose was uptitrated. She increased to 171m qhs last week and has had some daytime drowsiness. She brings her headache diary and does note that the severe throbbing headaches are less frequent, however she continues to have the near-daily 4/10 low grade headaches, mostly over the left side. She was on the computer yesterday and felt a lot of pressure, dizziness, and nausea, but not the sharp severe throbbing pain. She minimizes Maxalt intake, which does help. Zofran helps with nausea. She has been doing vestibular therapy, which she feels has been helping with the dizziness she feels when going into a grocery. She presents today asking about tapering amitriptyline in preparation for pregnancy.  HPI: This is a pleasant 34yo RH woman with a history of vestibular neuritis with chronic intermittent vertigo, GERD, IBS, and post-concussive headaches. [facility] [place] [facility] In June [facility], she woke up early morning feeling flushed with GI upset, went to the bathroom and passed out, waking up on the bathroom floor with cuts around her left eye, requiring 12 stitches. A week later, she was back to work then felt that the building was rocking back and forth. She was diagnosed with a concussion and did a month of physical and cognitive rest. She started having daily headaches described as a dull constant pressure with specific triggers, particularly computer screens, busy environments like grocery stores, bright environments, focusing on crafts. Worst headaches are stabbing/throbbing pains in the back/right  side of the head with dizziness, tightening sensation at the crown or left side of the head. There is occasional nausea, photo and phonophobia. Headaches worse with looking at screens or long periods of concentration. When bad headaches start, they will often last all day. She has continuous 6 or 7 over 10 pain with exacerbations 5-6 times a week up to 9 or 10 over 10. Once a week she will wake up with bad headache. No aura symptoms such as visual changes, however she feels that her vision has been blurred. She has seen neuro-ophthalmology and cleared from their standpoint, prescribed prescription lenses that helped with the blurred vision but not the headaches. She has been taking amitriptyline for IBS.She had noted some word-finding difficulties after the concussion and had neuropsychological testing in [place] she tells me her memory is okay per testing. There is a family history of migraines in her uncle, her mother has headaches.  She continues to have occasional vertigo when lying in bed. Turning her head does not help. She has stopped taking meclizine. She did vestibular therapy in [place] for several months, with eye tracking and vestibular exercises. She reports a history of panic attacks and has used ativan in the past. She had mild depression in the past.  Diagnostic Data per [facility]: 12/2012: MRI OF THE BRAIN WITHOUT INTRAVENOUS CONTRAST Impression:  Unremarkable MRI of the brain.  MRV Brain:  IMPRESSION: No evidence for cortical or dural venous thrombosis.  PAST MEDICAL HISTORY: Past Medical History  Diagnosis Date  . Asthma   . Depression   . GERD (gastroesophageal reflux disease)   . Frequent headaches   .  Migraines   . IBS (irritable bowel syndrome)   . Panic attacks   . Vertigo 12/31/2013  . Anxiety and depression     MEDICATIONS: Current Outpatient Prescriptions on File Prior to Visit  Medication Sig Dispense Refill  . albuterol  (PROVENTIL HFA) 108 (90 BASE) MCG/ACT inhaler Inhale 2 puffs into the lungs every 4 (four) hours as needed. 3 Inhaler 0  . Biotin 2500 MCG CAPS Take by mouth.    . drospirenone-ethinyl estradiol (GIANVI) 3-0.02 MG tablet Take 1 tablet by mouth daily.    Marland Kitchen FERROUS SULFATE PO Take 65 mg by mouth.    . Flaxseed, Linseed, (FLAX SEED OIL PO) Take by mouth.    . fluticasone (FLONASE) 50 MCG/ACT nasal spray Place 2 sprays into both nostrils daily. Please give 90 day supply 16 g 3  . LORazepam (ATIVAN) 0.5 MG tablet Take 0.5 mg by mouth every 8 (eight) hours as needed for anxiety (uses rarely for panic disorder).    . meclizine (ANTIVERT) 12.5 MG tablet Take 12.5 mg by mouth 3 (three) times daily as needed for dizziness.    Marland Kitchen omeprazole (PRILOSEC) 20 MG capsule Take 20 mg by mouth daily.    . Prenatal Vit-Fe Fumarate-FA (PRENATAL MULTIVITAMIN) TABS tablet Take 1 tablet by mouth daily at 12 noon.    . ranitidine (ACID REDUCER) 150 MG tablet Take 150 mg by mouth 2 (two) times daily.    . rizatriptan (MAXALT-MLT) 10 MG disintegrating tablet Take 1 tablet (10 mg total) by mouth as needed for migraine. May repeat in 2 hours if needed 9 tablet 11  amitritptyline 28m 4 tabs qhs No current facility-administered medications on file prior to visit.    ALLERGIES: Allergies  Allergen Reactions  . Citric Acid     Per patient report    FAMILY HISTORY: Family History  Problem Relation Age of Onset  . Hypertension Mother   . Cancer Mother     multiple myeloma  . Hypertension Father   . Stroke Father   . Cancer Maternal Grandmother     CLL    SOCIAL HISTORY: History   Social History  . Marital Status: Married    Spouse Name: N/A    Number of Children: N/A  . Years of Education: N/A   Occupational History  . Not on file.   Social History Main Topics  . Smoking status: Never Smoker   . Smokeless tobacco: Not on file  . Alcohol Use: No  . Drug Use: No  . Sexual Activity: Not on file    Other Topics Concern  . 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      Home Situation: lives with husband      Spiritual Beliefs: Christian      Lifestyle: doing 20 minutes of exercise and a little weight lifting daily; diet is healthy             REVIEW OF SYSTEMS: Constitutional: No fevers, chills, or sweats, no generalized fatigue, change in appetite Eyes: No visual changes, double vision, eye pain Ear, nose and throat: No hearing loss, ear pain, nasal congestion, sore throat Cardiovascular: No chest pain, palpitations Respiratory:  No shortness of breath at rest or with exertion, wheezes GastrointestinaI: No nausea, vomiting, diarrhea, abdominal pain, fecal incontinence Genitourinary:  No dysuria, urinary retention or frequency Musculoskeletal:  No neck pain, back pain Integumentary: No rash, pruritus, skin lesions Neurological: as above Psychiatric:  No depression, +insomnia, no anxiety Endocrine: No palpitations, fatigue, diaphoresis, mood swings, change in appetite, change in weight, increased thirst Hematologic/Lymphatic:  No anemia, purpura, petechiae. Allergic/Immunologic: no itchy/runny eyes, nasal congestion, recent allergic reactions, rashes  PHYSICAL EXAM: Filed Vitals:   05/25/14 0904  BP: 122/84  Pulse: 114  Resp: 16   General: No acute distress Head:  Normocephalic/atraumatic Neck: supple, no paraspinal tenderness, full range of motion Heart:  Regular rate and rhythm Lungs:  Clear to auscultation bilaterally Back: No paraspinal tenderness Skin/Extremities: No rash, no edema Neurological Exam: alert and oriented to person, place, and time. No aphasia or dysarthria. Fund of knowledge is appropriate.  Recent and remote memory are intact.  Attention and concentration are normal.    Able to name objects and repeat phrases. Cranial nerves: Pupils equal, round, reactive to light.  Fundoscopic exam unremarkable,  no papilledema. Extraocular movements intact with no nystagmus. Visual fields full. Facial sensation intact. No facial asymmetry. Tongue, uvula, palate midline.  Motor: Bulk and tone normal, muscle strength 5/5 throughout with no pronator drift.  Sensation to light touch  intact.  No extinction to double simultaneous stimulation.  Deep tendon reflexes 2+ throughout, toes downgoing.  Finger to nose testing intact.  Gait narrow-based and steady, able to tandem walk adequately.  Romberg negative.  IMPRESSION: This is a pleasant 34 yo RH woman with a history of vestibular neuritis with chronic intermittent vertigo, GERD, IBS, and post-concussive headaches since a fall in June 2014. She continues to have chronic daily headaches with some improvement on higher dose amitriptyline. She presents today asking to taper off amitriptyline in preparation for pregnancy. We discussed that it is a category C medication, and that it should be avoided as migraine prophylaxis unless other medications are ineffective. She will start daily magnesium and riboflavin and start weaning off amitriptyline. She will try melatonin for sleep, as the amitriptyline has been helping with that. The importance of sleep hygiene was discussed. She will speak with her OB as well regarding medications she can use once pregnant. She will follow-up in 2 months once off the medication. Continue prn Maxalt for now. We discussed rebound headaches, she knows to minimize any rescue medication to 2-3 a week to avoid rebound headaches. Continue vestibular therapy.   Thank you for allowing me to participate in her care.  Please do not hesitate to call for any questions or concerns.  The duration of this appointment visit was 15 minutes of face-to-face time with the patient.  Greater than 50% of this time was spent in counseling, explanation of diagnosis, planning of further management, and coordination of care.   Ellouise Newer, M.D.   CC: Dr.  Maudie Mercury

## 2014-05-28 ENCOUNTER — Encounter: Payer: Self-pay | Admitting: Neurology

## 2014-05-31 ENCOUNTER — Encounter: Payer: BC Managed Care – PPO | Admitting: Rehabilitative and Restorative Service Providers"

## 2014-06-07 ENCOUNTER — Encounter: Payer: Self-pay | Admitting: Rehabilitative and Restorative Service Providers"

## 2014-06-07 ENCOUNTER — Ambulatory Visit: Payer: 59 | Admitting: Rehabilitative and Restorative Service Providers"

## 2014-06-07 DIAGNOSIS — R42 Dizziness and giddiness: Secondary | ICD-10-CM

## 2014-06-07 NOTE — Therapy (Signed)
[facility] [address] [place], [place], [address] Phone: [phone]   Fax:  [phone]  Physical Therapy Treatment  Patient Details  Name: Kelsey [name] MRN: [id number] Date of Birth: 05/25/1980 Referring Provider:  Lucretia Kern, DO  Encounter Date: 06/07/2014      PT End of Session - 06/07/14 1138    Visit Number 5   Number of Visits 10   Date for PT Re-Evaluation 05/26/14   Authorization Type [employer] [place] [employer]   PT Start Time [phone]   PT Stop Time 1020   PT Time Calculation (min) 42 min   Activity Tolerance Patient tolerated treatment well      Past Medical History  Diagnosis Date  . Asthma   . Depression   . GERD (gastroesophageal reflux disease)   . Frequent headaches   . Migraines   . IBS (irritable bowel syndrome)   . Panic attacks   . Vertigo 12/31/2013  . Anxiety and depression     No past surgical history on file.  LMP 05/02/2014 (Approximate)  Visit Diagnosis:  Dizziness and giddiness      Subjective Assessment - 06/07/14 0940    Symptoms The patient reports cc: visual blurring with movement, headaches continue but infrequently.  She reports dizziness is not happening unless she is triggering it with driving, computer use.  She reports lying down dizziness has improved.  She is changing her headache meds.      Pt still struggles with computer work and notices a headache when responding to e-mails.  NEUROMUSCULAR RE-EDUCATION: Reviewed gaze x 1 seated position today with 3/10 dizziness after 30 seconds.   Seated corrective saccades with eyes/head coordination. Sensory organization testing=85% Pt c/o sensation of walls and platform moving and feeling more headache today when performing.  THERAPEUTIC EXERCISE: Seated neck tension stretches-see patient instructions.  SELF CARE/HOME MANAGEMENT: The patient and PT discussed continuing HEP after d/c from PT, progressing home walking  program. Also recommended computer time in short segments with neck tension stretches to decrease HAs with computer usage.       PT Education - 06/07/14 1137    Education provided Yes   Education Details HEP: neck tension stretches for post d/c HEP.   Person(s) Educated Patient   Methods Explanation;Handout;Demonstration   Comprehension Returned demonstration;Verbalized understanding          PT Short Term Goals - 06/07/14 0954    PT SHORT TERM GOAL #1   Title The patient will return demo HEP for gaze, habituation for motion sensitivity, and dynamic balance activities (05/26/2014)   Baseline met on 05/24/2014   Time 4   Period Weeks   Status Achieved   PT SHORT TERM GOAL #2   Title The patient will tolerate gaze x 1 viewing x 30 seconds with dizziness < or equal to 2/10 to demo improved tolerance to movement (05/26/2014).   Baseline Pt reports 3/10.   Time 4   Period Weeks   Status Partially Met   PT SHORT TERM GOAL #3   Title The patient will be further assessed on sensory organization test and improve by 10% on equilibrium score (05/26/2014)   Baseline The patient initially scored 73% (WNLs for age/height), however had diminished use of somatosensory inputs for balance.  Retested on 06/07/2014 and pt WNLs now scoring 85% with WNLs use of all sensory systems for balance.   Time 4   Status Achieved   PT SHORT TERM GOAL #4  Title The patient will verbalize understanding of home walking program (05/26/2014)   Baseline met on 05/24/2014   Time 4   Period Weeks   Status Achieved           PT Long Term Goals - 06/07/14 1010    PT LONG TERM GOAL #1   Title The patient will decrease DHI by 15% to demo improved self perception of deficits (06/25/2014).   Baseline DHI not performed during length of stay.   Time 8   Period Weeks   Status Deferred   PT LONG TERM GOAL #2   Title The pateint will tolerate gaze x 1 viewing x 60 seconds with dizziness < or equal to 2/10 (06/25/2014).   Time  8   Period Weeks   Status Not Met   PT LONG TERM GOAL #3   Title The patient will have a 3 line or less difference on SVA vs DVA (06/25/2014)   Baseline Pt has 3 line difference on SVA vs. DVA.   Time 8   Period Weeks   Status Achieved   PT LONG TERM GOAL #4   Title The patient will tolerate computer activities x 30 minutes reporting symptoms of HA and dizziness < 2/10 change from baseline (06/25/2014).   Baseline This varies from day to day.  Pt still avoids computer work for longer periods and    Time 8   Period Weeks   Status Not Met               Plan - 06/07/14 1424    Clinical Impression Statement The patient met 3/4 STGs and partially met STG for dizziness during gaze x 1 viewing.  The patient met LTG for SVA vs. DVA.  She continues with limitations from headaches with computer usage and dizziness with driving.  The patient has a HEP to continue working on post d/c, she has returned to walking for exercise.  Her symptoms appear multifactorial in nature with dizzy symptoms worse during HA/migraines.   PT Next Visit Plan d/c   Consulted and Agree with Plan of Care Patient        Problem List Patient Active Problem List   Diagnosis Date Noted  . Asthma, mild persistent 03/08/2014  . GERD (gastroesophageal reflux disease) 03/08/2014  . Seasonal allergies 03/08/2014  . IBS (irritable bowel syndrome) 03/08/2014  . Anxiety and depression 03/08/2014  . Chronic daily headache 12/31/2013  . Vertigo 12/31/2013  . Postconcussive syndrome 12/31/2013    Mandi Mattioli, PT 06/07/2014, 2:42 PM  [facility] [address] [place], [place], [address] Phone: [phone]   Fax:  [phone]

## 2014-06-07 NOTE — Therapy (Signed)
[facility] [address] [place], [place], [address] Phone: [phone]   Fax:  [phone]  Patient Details  Name: Kelsey [name] MRN: [id number] Date of Birth: March 30, 1981 Referring Provider:  No ref. provider found  Encounter Date: 06/07/2014  PHYSICAL THERAPY DISCHARGE SUMMARY  Visits from Start of Care: 5  Current functional level related to goals / functional outcomes: PT Short Term Goals - 06/07/14 0954    PT SHORT TERM GOAL #1    Title  The patient will return demo HEP for gaze, habituation for motion sensitivity, and dynamic balance activities (05/26/2014)    Baseline  met on 05/24/2014    Time  4    Period  Weeks    Status  Achieved    PT SHORT TERM GOAL #2    Title  The patient will tolerate gaze x 1 viewing x 30 seconds with dizziness < or equal to 2/10 to demo improved tolerance to movement (05/26/2014).    Baseline  Pt reports 3/10.    Time  4    Period  Weeks    Status  Partially Met    PT SHORT TERM GOAL #3    Title  The patient will be further assessed on sensory organization test and improve by 10% on equilibrium score (05/26/2014)    Baseline  The patient initially scored 73% (WNLs for age/height), however had diminished use of somatosensory inputs for balance. Retested on 06/07/2014 and pt WNLs now scoring 85% with WNLs use of all sensory systems for balance.    Time  4    Status  Achieved    PT SHORT TERM GOAL #4    Title  The patient will verbalize understanding of home walking program (05/26/2014)    Baseline  met on 05/24/2014    Time  4    Period  Weeks    Status  Achieved           PT Long Term Goals - 06/07/14 1010    PT LONG TERM GOAL #1    Title  The patient will decrease DHI by 15% to demo improved self perception of deficits (06/25/2014).    Baseline  DHI not performed during length of stay.    Time  8    Period  Weeks    Status  Deferred    PT LONG TERM GOAL #2    Title  The pateint will  tolerate gaze x 1 viewing x 60 seconds with dizziness < or equal to 2/10 (06/25/2014).    Time  8    Period  Weeks    Status  Not Met    PT LONG TERM GOAL #3    Title  The patient will have a 3 line or less difference on SVA vs DVA (06/25/2014)    Baseline  Pt has 3 line difference on SVA vs. DVA.    Time  8    Period  Weeks    Status  Achieved    PT LONG TERM GOAL #4    Title  The patient will tolerate computer activities x 30 minutes reporting symptoms of HA and dizziness < 2/10 change from baseline (06/25/2014).    Baseline  This varies from day to day. Pt still avoids computer work for longer periods and    Time  8    Period  Weeks    Status  Not Met         Remaining deficits: Decreased tolerance to  driving and computer activities.   Education / Equipment: HEP to address vestibular deficits, return to home walking.  Plan: Patient agrees to discharge.  Patient goals were not met. Patient is being discharged due to meeting the stated rehab goals.  ?????   Thank you for the referral of this patient.     [place], [place] 06/07/2014, 2:43 PM  [facility] [address] [place], [place], [address] Phone: [phone]   Fax:  [phone]

## 2014-06-07 NOTE — Patient Instructions (Signed)
Healthy Back - Shoulder Roll   Stand straight with arms relaxed at sides. Roll shoulders backward continuously. Do __10__ times. Throughout the day as needed. This exercise can also be done one shoulder at a time.  Copyright  VHI. All rights reserved.  Upper Limb Neural Tension I, Standing   NO PULLING AT HEAD WITH THIS EXERCISE. Stand, turn head down toward one shoulder.   Hold 10 seconds.  Repeat __3_ times per session. Do _2__ sessions per day.  Copyright  VHI. All rights reserved.

## 2014-07-05 ENCOUNTER — Ambulatory Visit: Payer: Self-pay | Admitting: Gastroenterology

## 2014-07-11 ENCOUNTER — Encounter: Payer: Self-pay | Admitting: Family Medicine

## 2014-07-11 ENCOUNTER — Ambulatory Visit (INDEPENDENT_AMBULATORY_CARE_PROVIDER_SITE_OTHER): Payer: 59 | Admitting: Family Medicine

## 2014-07-11 VITALS — BP 116/68 | HR 100 | Ht 64.25 in | Wt 142.4 lb

## 2014-07-11 DIAGNOSIS — F419 Anxiety disorder, unspecified: Secondary | ICD-10-CM

## 2014-07-11 DIAGNOSIS — R519 Headache, unspecified: Secondary | ICD-10-CM

## 2014-07-11 DIAGNOSIS — K769 Liver disease, unspecified: Secondary | ICD-10-CM

## 2014-07-11 DIAGNOSIS — J4530 Mild persistent asthma, uncomplicated: Secondary | ICD-10-CM

## 2014-07-11 DIAGNOSIS — F329 Major depressive disorder, single episode, unspecified: Secondary | ICD-10-CM

## 2014-07-11 DIAGNOSIS — R51 Headache: Secondary | ICD-10-CM

## 2014-07-11 DIAGNOSIS — M79643 Pain in unspecified hand: Secondary | ICD-10-CM

## 2014-07-11 DIAGNOSIS — K219 Gastro-esophageal reflux disease without esophagitis: Secondary | ICD-10-CM

## 2014-07-11 DIAGNOSIS — F418 Other specified anxiety disorders: Secondary | ICD-10-CM

## 2014-07-11 NOTE — Progress Notes (Signed)
Pre visit review using our clinic review tool, if applicable. No additional management support is needed unless otherwise documented below in the visit note. 

## 2014-07-11 NOTE — Patient Instructions (Signed)
-  please schedule your follow up with your gastroenterologist  -schedule preconception visit with an obstetrician  -for the hands: -wear cock up brace - loosely fitted at night -tylenol is safe for pain during pregnancy  Try topical menthol  Follow up with your neurologist about the headaches  Please schedule an appointment with psychiatrist

## 2014-07-11 NOTE — Progress Notes (Signed)
n HPI:  HM: pap  GERD/IBS/elevated lipase/liver edema on [place]: -eval by GI 03/2014; of note they wanted her to get hep panel but she did not return their calls -meds:prilosec, ranitidine -denies: frequent symptoms, dysphagia  Allergies/Mild intermittent asthma: -day/night symptoms: rare, intermittent -meds:alb prn, flonase and zyrtec - intermittently, maxair -denies wheezing, SOB, sinus pain, fevers  Migraines/Vertigo: -seeing neurologist -weaning off of amitriptyline because trying to conceive  -appreciate recs  Acne: -on face -did green cream and benzoyl peroxide -denies pain, itching  Depression and Anxiety: -referred to psychiatrist - she is not seeing the psychiatrist as lost her contact number -she is seeing counselor at church -denies: SI, worsening  Mild aching in hands: -bilat, sometimes at night in thumb -does a lot of repetitive work with hands -denies: weaknes, numbness  ROS: See pertinent positives and negatives per HPI.  Past Medical History  Diagnosis Date  . Asthma   . Depression   . GERD (gastroesophageal reflux disease)   . Frequent headaches   . Migraines   . IBS (irritable bowel syndrome)   . Panic attacks   . Vertigo 12/31/2013  . Anxiety and depression     No past surgical history on file.  Family History  Problem Relation Age of Onset  . Hypertension Mother   . Cancer Mother     multiple myeloma  . Hypertension Father   . Stroke Father   . Cancer Maternal Grandmother     CLL    History   Social History  . Marital Status: Married    Spouse Name: N/A  . Number of Children: N/A  . Years of Education: N/A   Social History Main Topics  . Smoking status: Never Smoker   . Smokeless tobacco: Not on file  . Alcohol Use: No  . Drug Use: No  . Sexual Activity: Not on file   Other Topics Concern  . 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  albuterol (PROVENTIL HFA) 108 (90 BASE) MCG/ACT inhaler, Inhale 2 puffs into the lungs every 4 (four) hours as needed., Disp: 3 Inhaler, Rfl: 0 .  amitriptyline (ELAVIL) 25 MG tablet, Take 25 mg by mouth at bedtime. Takes 4 at bedtime, Disp: , Rfl:  .  Biotin 2500 MCG CAPS, Take by mouth., Disp: , Rfl:  .  drospirenone-ethinyl estradiol (GIANVI) 3-0.02 MG tablet, Take 1 tablet by mouth daily., Disp: , Rfl:  .  FERROUS SULFATE PO, Take 65 mg by mouth., Disp: , Rfl:  .  Flaxseed, Linseed, (FLAX SEED OIL PO), Take by mouth., Disp: , Rfl:  .  fluticasone (FLONASE) 50 MCG/ACT nasal spray, Place 2 sprays into both nostrils daily. Please give 90 day supply, Disp: 16 g, Rfl: 3 .  LORazepam (ATIVAN) 0.5 MG tablet, Take 0.5 mg by mouth every 8 (eight) hours as needed for anxiety (uses rarely for panic disorder)., Disp: , Rfl:  .  meclizine (ANTIVERT) 12.5 MG tablet, Take 12.5 mg by mouth 3 (three) times daily as needed for dizziness., Disp: , Rfl:  .  omeprazole (PRILOSEC) 20 MG capsule, Take 20 mg by mouth daily., Disp: ,  Rfl:  .  Prenatal Vit-Fe Fumarate-FA (PRENATAL MULTIVITAMIN) TABS tablet, Take 1 tablet by mouth daily at 12 noon., Disp: , Rfl:  .  ranitidine (ACID REDUCER) 150 MG tablet, Take 150 mg by mouth 2 (two) times daily., Disp: , Rfl:  .  rizatriptan (MAXALT-MLT) 10 MG disintegrating tablet, Take 1 tablet (10 mg total) by mouth as needed for migraine. May repeat in 2 hours if needed, Disp: 9 tablet, Rfl: 11  EXAM:  Filed Vitals:   07/11/14 0854  BP: 116/68  Pulse: 100    Body mass index is 24.25 kg/(m^2).  GENERAL: vitals reviewed and listed above, alert, oriented, appears well hydrated and in no acute distress  HEENT: atraumatic, conjunttiva clear, no obvious abnormalities on inspection of external nose  and ears  NECK: no obvious masses on inspection  LUNGS: clear to auscultation bilaterally, no wheezes, rales or rhonchi, good air movement  CV: HRRR, no peripheral edema  HANDS: normal appearance except for mild hyperextensibility of DIPs 3rd digits only, normal strength and sensation to light touch throughout, no swelling, no erythema, normal cap refill and radial pulses  MS: moves all extremities without noticeable abnormality  PSYCH: pleasant and cooperative, no obvious depression or anxiety  ASSESSMENT AND PLAN:  Discussed the following assessment and plan:  Chronic daily headache  Asthma, mild persistent, uncomplicated  Gastroesophageal reflux disease, esophagitis presence not specified  Anxiety and depression  Pain of hand, unspecified laterality  Liver disorder  -advised to contact her gastroenterologist for f/u -advised to see psychiatrist -conservative tx for hand pain and advised follow up if persists -advised to schedule preconception visit with ob, and discuss safe medication options for headaches during pregnancy, advised tylenol and topical menthol safe -Patient advised to return or notify a doctor immediately if symptoms worsen or persist or new concerns arise.  Patient Instructions  -please schedule your follow up with your gastroenterologist  -schedule preconception visit with an obstetrician  -for the hands: -wear cock up brace - loosely fitted at night -tylenol is safe for pain during pregnancy  Try topical menthol  Follow up with your neurologist about the headaches  Please schedule an appointment with psychiatrist        Lucretia Kern.

## 2014-07-25 ENCOUNTER — Encounter: Payer: Self-pay | Admitting: Neurology

## 2014-07-25 ENCOUNTER — Ambulatory Visit (INDEPENDENT_AMBULATORY_CARE_PROVIDER_SITE_OTHER): Payer: 59 | Admitting: Neurology

## 2014-07-25 VITALS — BP 110/80 | HR 83 | Resp 16 | Ht 64.25 in | Wt 140.0 lb

## 2014-07-25 DIAGNOSIS — R519 Headache, unspecified: Secondary | ICD-10-CM

## 2014-07-25 DIAGNOSIS — R110 Nausea: Secondary | ICD-10-CM

## 2014-07-25 DIAGNOSIS — R51 Headache: Secondary | ICD-10-CM

## 2014-07-25 MED ORDER — PROMETHAZINE HCL 12.5 MG PO TABS: 12.5000 mg | ORAL_TABLET | Freq: Three times a day (TID) | ORAL | Status: DC | PRN

## 2014-07-25 MED ORDER — AMITRIPTYLINE HCL 25 MG PO TABS: 25.0000 mg | ORAL_TABLET | Freq: Every day | ORAL | Status: DC

## 2014-07-25 NOTE — Patient Instructions (Addendum)
1. Restart amitriptyline 25mg : Take 1/2 tablet at bedtime for 1 week, then increase to 1 tablet at bedtime. Monitor symptoms, slowly increase by 1/2 tablet every week  2. Continue headache diary 3. May take Phenergan 12.5mg  every 8 hours as needed for nausea 4. Follow-up in 2 months

## 2014-07-25 NOTE — Progress Notes (Signed)
NEUROLOGY FOLLOW UP OFFICE NOTE  Kelsey [name] [id number]  HISTORY OF PRESENT ILLNESS: I had the pleasure of seeing Kelsey [name] in follow-up in the neurology clinic on 07/25/2014.  The patient was last seen 2 months ago for chronic daily headaches after concussion in June 2014. On her last visit, she had requested a taper trial of amitriptyline in preparation for pregnancy plans. She is accompanied by her husband today who helps supplement the history. She brings her headache calendar, and reports that with each reduction in amitriptyline dose, she had noticed worsening of headaches. In addition, she has started to have nausea and vomiting. She is more sensitive to headache triggers. She has been having more sleep difficulties, sometimes she does not sleep at all. She had started magnesium and riboflavin with no change in symptoms. She is taking melatonin 11m with no effect on sleep. The dizziness has worsened, particularly with head movements.   HPI: This is a pleasant 34yo RH woman with a history of vestibular neuritis with chronic intermittent vertigo, GERD, IBS, and post-concussive headaches. [place] [facility] [place] [facility] In June 2014, she woke up early morning feeling flushed with GI upset, went to the bathroom and passed out, waking up on the bathroom floor with cuts around her left eye, requiring 12 stitches. A week later, she was back to work then felt that the building was rocking back and forth. She was diagnosed with a concussion and did a month of physical and cognitive rest. She started having daily headaches described as a dull constant pressure with specific triggers, particularly computer screens, busy environments like grocery stores, bright environments, focusing on crafts. Worst headaches are stabbing/throbbing pains in the back/right side of the head with dizziness, tightening sensation at the crown or left side of the head. There is  occasional nausea, photo and phonophobia. Headaches worse with looking at screens or long periods of concentration. When bad headaches start, they will often last all day. She has continuous 6 or 7 over 10 pain with exacerbations 5-6 times a week up to 9 or 10 over 10. Once a week she will wake up with bad headache. No aura symptoms such as visual changes, however she feels that her vision has been blurred. She has seen neuro-ophthalmology and cleared from their standpoint, prescribed prescription lenses that helped with the blurred vision but not the headaches. She has been taking amitriptyline for IBS.She had noted some word-finding difficulties after the concussion and had neuropsychological testing in [place] she tells me her memory is okay per testing. There is a family history of migraines in her uncle, her mother has headaches.  She continues to have occasional vertigo when lying in bed. Turning her head does not help. She has stopped taking meclizine. She did vestibular therapy in [place] for several months, with eye tracking and vestibular exercises. She reports a history of panic attacks and has used ativan in the past. She had mild depression in the past.  Diagnostic Data per [facility]: 12/2012: MRI OF THE BRAIN WITHOUT INTRAVENOUS CONTRAST Impression:  Unremarkable MRI of the brain.  MRV Brain:  IMPRESSION: No evidence for cortical or dural venous thrombosis.  PAST MEDICAL HISTORY: Past Medical History  Diagnosis Date  . Asthma   . Depression   . GERD (gastroesophageal reflux disease)   . Frequent headaches   . Migraines   . IBS (irritable bowel syndrome)   . Panic attacks   .  Vertigo 12/31/2013  . Anxiety and depression     MEDICATIONS: Current Outpatient Prescriptions on File Prior to Visit  Medication Sig Dispense Refill  . albuterol (PROVENTIL HFA) 108 (90 BASE) MCG/ACT inhaler Inhale 2 puffs into the lungs every 4 (four) hours as needed. 3  Inhaler 0  . Biotin 2500 MCG CAPS Take by mouth.    Kelsey [name] Take 65 mg by mouth.    . Flaxseed, Linseed, (FLAX SEED OIL [name]) Take by mouth.    . fluticasone (FLONASE) 50 MCG/ACT nasal spray Place 2 sprays into both nostrils daily. Please give 90 day supply 16 g 3  . LORazepam (ATIVAN) 0.5 MG tablet Take 0.5 mg by mouth every 8 (eight) hours as needed for anxiety (uses rarely for panic disorder).    . meclizine (ANTIVERT) 12.5 MG tablet Take 12.5 mg by mouth 3 (three) times daily as needed for dizziness.    Marland Kitchen omeprazole (PRILOSEC) 20 MG capsule Take 20 mg by mouth as needed.     . Prenatal Vit-Fe Fumarate-FA (PRENATAL MULTIVITAMIN) TABS tablet Take 1 tablet by mouth daily at 12 noon.    . ranitidine (ACID REDUCER) 150 MG tablet Take 150 mg by mouth 2 (two) times daily.     No current facility-administered medications on file prior to visit.    ALLERGIES: Allergies  Allergen Reactions  . Citric Acid     Per patient report    FAMILY HISTORY: Family History  Problem Relation Age of Onset  . Hypertension Mother   . Cancer Mother     multiple myeloma  . Hypertension Father   . Stroke Father   . Cancer Maternal Grandmother     CLL    SOCIAL HISTORY: History   Social History  . Marital Status: Married    Spouse Name: N/A  . Number of Children: N/A  . Years of Education: N/A   Occupational History  . Not on file.   Social History Main Topics  . Smoking status: Never Smoker   . Smokeless tobacco: Not on file  . Alcohol Use: No  . Drug Use: No  . Sexual Activity: Not on file   Other Topics Concern  . 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      Home Situation: lives with husband      Spiritual Beliefs: Christian      Lifestyle: doing 20 minutes of exercise and a little weight lifting daily; diet is healthy             REVIEW OF SYSTEMS: Constitutional: No fevers, chills, or sweats, no generalized  fatigue, change in appetite Eyes: No visual changes, double vision, eye pain Ear, nose and throat: No hearing loss, ear pain, nasal congestion, sore throat Cardiovascular: No chest pain, palpitations Respiratory:  No shortness of breath at rest or with exertion, wheezes GastrointestinaI: No nausea, vomiting, diarrhea, abdominal pain, fecal incontinence Genitourinary:  No dysuria, urinary retention or frequency Musculoskeletal:  No neck pain, back pain Integumentary: No rash, pruritus, skin lesions Neurological: as above Psychiatric: No depression, insomnia, anxiety Endocrine: No palpitations, fatigue, diaphoresis, mood swings, change in appetite, change in weight, increased thirst Hematologic/Lymphatic:  No anemia, purpura, petechiae. Allergic/Immunologic: no itchy/runny eyes, nasal congestion, recent allergic reactions, rashes  PHYSICAL EXAM: Filed Vitals:   07/25/14 1031  BP: 110/80  Pulse: 83  Resp: 16   General: No acute distress Head:  Normocephalic/atraumatic Neck: supple, no  paraspinal tenderness, full range of motion Heart:  Regular rate and rhythm Lungs:  Clear to auscultation bilaterally Back: No paraspinal tenderness Skin/Extremities: No rash, no edema Neurological Exam: alert and oriented to person, place, and time. No aphasia or dysarthria. Fund of knowledge is appropriate.  Recent and remote memory are intact.  Attention and concentration are normal.    Able to name objects and repeat phrases. Cranial nerves: Pupils equal, round, reactive to light.  Fundoscopic exam unremarkable, no papilledema. Extraocular movements intact with no nystagmus. Visual fields full. Facial sensation intact. No facial asymmetry. Tongue, uvula, palate midline.  Motor: Bulk and tone normal, muscle strength 5/5 throughout with no pronator drift.  Sensation to light touch, temperature and vibration intact.  No extinction to double simultaneous stimulation.  Deep tendon reflexes 2+ throughout, toes  downgoing.  Finger to nose testing intact.  Gait narrow-based and steady, able to tandem walk adequately.  Romberg negative.  IMPRESSION: This is a pleasant 34 yo RH woman with a history of vestibular neuritis with chronic intermittent vertigo, GERD, IBS, and post-concussive headaches since a fall in June 2014. She continued to have chronic daily headaches with some improvement on higher dose amitriptyline, however with pregnancy plans, requested tapering off amitriptyline. Unfortunately, headaches have worsened, now with nausea, vomiting, and sleep difficulties. We again discussed amitriptyline as category C medication, and that it should be avoided as migraine prophylaxis unless other medications are ineffective. We discussed benefits versus risk, and have agreed to restart amitriptyline low dose and slowly increase and assess what is the lowest dose where she would be more comfortable. She will restart 75m 1/2 tab and slowly uptitrate. She will increase melatonin to 160mqhs. She may take prn Phenergan for the nausea. Continue daily magnesium and riboflavin. She will continue home vestibular exercises, keep her headache diary, and follow-up in 2 months. She knows to [name] our office for any problems in the interim.  Thank you for allowing me to participate in her care.  Please do not hesitate to [name] for any questions or concerns.  The duration of this appointment visit was 25 minutes of face-to-face time with the patient.  Greater than 50% of this time was spent in counseling, explanation of diagnosis, planning of further management, and coordination of care.   KaEllouise NewerM.D.   CC: Dr. KiMaudie Mercury

## 2014-08-24 ENCOUNTER — Encounter: Payer: Self-pay | Admitting: Family Medicine

## 2014-08-24 NOTE — Telephone Encounter (Signed)
I called the pt and scheduled an appt for tomorrow.

## 2014-08-25 ENCOUNTER — Other Ambulatory Visit: Payer: 59

## 2014-08-25 ENCOUNTER — Ambulatory Visit (INDEPENDENT_AMBULATORY_CARE_PROVIDER_SITE_OTHER): Payer: 59 | Admitting: Family Medicine

## 2014-08-25 ENCOUNTER — Encounter: Payer: Self-pay | Admitting: Family Medicine

## 2014-08-25 VITALS — BP 118/80 | HR 97 | Temp 98.0°F | Ht 64.25 in | Wt 141.8 lb

## 2014-08-25 DIAGNOSIS — M79644 Pain in right finger(s): Secondary | ICD-10-CM | POA: Diagnosis not present

## 2014-08-25 DIAGNOSIS — R748 Abnormal levels of other serum enzymes: Secondary | ICD-10-CM

## 2014-08-25 DIAGNOSIS — J302 Other seasonal allergic rhinitis: Secondary | ICD-10-CM | POA: Diagnosis not present

## 2014-08-25 DIAGNOSIS — L709 Acne, unspecified: Secondary | ICD-10-CM | POA: Diagnosis not present

## 2014-08-25 MED ORDER — SULFACETAMIDE SODIUM 10 % EX SUSP: CUTANEOUS | Status: DC

## 2014-08-25 MED ORDER — FLUTICASONE PROPIONATE 50 MCG/ACT NA SUSP: 2.0000 | Freq: Every day | NASAL | Status: DC

## 2014-08-25 NOTE — Progress Notes (Signed)
Pre visit review using our clinic review tool, if applicable. No additional management support is needed unless otherwise documented below in the visit note. 

## 2014-08-25 NOTE — Progress Notes (Signed)
HPI:  R thumb pain: -R thumb distal phalangial jt -started 2 days ago acutely -small area of erythema, swelling and pain of dorsal aspect of DIP -now is resolving with a thumb splint -denies: malaise, trauma, weakness, decreased ROM, swelling or inflammation in other joints  ROS: See pertinent positives and negatives per HPI.  Past Medical History  Diagnosis Date  . Asthma   . Depression   . GERD (gastroesophageal reflux disease)   . Frequent headaches   . Migraines   . IBS (irritable bowel syndrome)   . Panic attacks   . Vertigo 12/31/2013  . Anxiety and depression     No past surgical history on file.  Family History  Problem Relation Age of Onset  . Hypertension Mother   . Cancer Mother     multiple myeloma  . Hypertension Father   . Stroke Father   . Cancer Maternal Grandmother     CLL    History   Social History  . Marital Status: Married    Spouse Name: N/A  . Number of Children: N/A  . Years of Education: N/A   Social History Main Topics  . Smoking status: Never Smoker   . Smokeless tobacco: Not on file  . Alcohol Use: No  . Drug Use: No  . Sexual Activity: Not on file   Other Topics Concern  . 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  albuterol (PROVENTIL HFA) 108 (90 BASE) MCG/ACT inhaler, Inhale 2 puffs into the lungs every 4 (four) hours as needed., Disp: 3 Inhaler, Rfl: 0 .  amitriptyline (ELAVIL) 25 MG tablet, Take 1 tablet (25 mg total) by mouth at bedtime., Disp: 30 tablet, Rfl: 3 .  Biotin 2500 MCG CAPS, Take by mouth., Disp: , Rfl:  .  FERROUS SULFATE PO, Take 65 mg by mouth., Disp: , Rfl:  .  Flaxseed, Linseed, (FLAX SEED OIL PO), Take by mouth., Disp: , Rfl:  .   fluticasone (FLONASE) 50 MCG/ACT nasal spray, Place 2 sprays into both nostrils daily. Please give 90 day supply, Disp: 16 g, Rfl: 3 .  LORazepam (ATIVAN) 0.5 MG tablet, Take 0.5 mg by mouth every 8 (eight) hours as needed for anxiety (uses rarely for panic disorder)., Disp: , Rfl:  .  Magnesium 400 MG CAPS, Take 400 mg by mouth daily., Disp: , Rfl:  .  meclizine (ANTIVERT) 12.5 MG tablet, Take 12.5 mg by mouth 3 (three) times daily as needed for dizziness., Disp: , Rfl:  .  Melatonin 3 MG CAPS, Take 3 mg by mouth at bedtime., Disp: , Rfl:  .  omeprazole (PRILOSEC) 20 MG capsule, Take 20 mg by mouth as needed. , Disp: , Rfl:  .  Prenatal Vit-Fe Fumarate-FA (PRENATAL MULTIVITAMIN) TABS tablet, Take 1 tablet by mouth daily at 12 noon., Disp: , Rfl:  .  promethazine (PHENERGAN) 12.5 MG tablet, Take 1 tablet (12.5 mg total) by mouth every 8 (eight) hours as needed for nausea or vomiting., Disp: 30 tablet, Rfl: 1 .  ranitidine (ACID REDUCER) 150 MG tablet, Take 150 mg by mouth 2 (two) times daily., Disp: ,  Rfl:  .  riboflavin (VITAMIN B-2) 100 MG TABS tablet, 100 mg. Takes 2 every am & 2 every pm, Disp: , Rfl:   EXAM:  Filed Vitals:   08/25/14 1416  BP: 118/80  Pulse: 97  Temp: 98 F (36.7 C)    Body mass index is 24.15 kg/(m^2).  GENERAL: vitals reviewed and listed above, alert, oriented, appears well hydrated and in no acute distress  HEENT: atraumatic, conjunttiva clear, no obvious abnormalities on inspection of external nose and ears  NECK: no obvious masses on inspection  MS: moves all extremities without noticeable abnormality, normal inspection of hands except for minimal TTP and ? Mild swelling over lateral dorsal aspect of 1st distal phalangeal joint  PSYCH: pleasant and cooperative, no obvious depression or anxiety  ASSESSMENT AND PLAN:  Discussed the following assessment and plan:  Thumb pain, right  -query gout, pseudogout, tendinitis? -resolving without treatment -  discussed doing xray, further eval - she opted since resolving to do nsaids and observation and see ortho if persist -Patient advised to return or notify a doctor immediately if symptoms worsen or persist or new concerns arise.  Patient Instructions  Naproxen (aleve) 1-2 tablets twice daily for 5-7 days  Call if persists or worsens     Lynx Goodrich R.

## 2014-08-25 NOTE — Patient Instructions (Signed)
Naproxen (aleve) 1-2 tablets twice daily for 5-7 days  Call if persists or worsens

## 2014-08-25 NOTE — Addendum Note (Signed)
Addended by: Terressa KoyanagiKIM, HANNAH R on: 08/25/2014 02:39 PM   Modules accepted: Orders

## 2014-08-26 LAB — HEPATITIS B SURFACE ANTIGEN: HEP B S AG: NEGATIVE

## 2014-08-26 LAB — HEPATITIS C ANTIBODY: HCV Ab: NEGATIVE

## 2014-09-07 ENCOUNTER — Ambulatory Visit (INDEPENDENT_AMBULATORY_CARE_PROVIDER_SITE_OTHER): Payer: 59 | Admitting: Gastroenterology

## 2014-09-07 ENCOUNTER — Encounter: Payer: Self-pay | Admitting: Gastroenterology

## 2014-09-07 VITALS — BP 90/60 | HR 88 | Ht 64.75 in | Wt 141.1 lb

## 2014-09-07 DIAGNOSIS — R899 Unspecified abnormal finding in specimens from other organs, systems and tissues: Secondary | ICD-10-CM

## 2014-09-07 DIAGNOSIS — K589 Irritable bowel syndrome without diarrhea: Secondary | ICD-10-CM | POA: Diagnosis not present

## 2014-09-07 DIAGNOSIS — K219 Gastro-esophageal reflux disease without esophagitis: Secondary | ICD-10-CM | POA: Diagnosis not present

## 2014-09-07 DIAGNOSIS — R112 Nausea with vomiting, unspecified: Secondary | ICD-10-CM | POA: Insufficient documentation

## 2014-09-07 NOTE — Progress Notes (Signed)
      History of Present Illness:  Kelsey [name] return for follow-up of several GI issues.  She's had an asymptomatic elevated lipase in the past.  Repeat numbers at her last visit were normal.  Abdominal ultrasound was unrevealing except for perhaps minimal edema of the liver (a nonspecific finding).  She tends to be constipated but that is relieved by taking daily fiber.  She has pyrosis which is well-controlled with ranitidine only.  She suffers from anxiety.  When this increases she may have dry heaves.  She recently stopped amitriptyline because she is trying to get pregnant.    Review of Systems: Pertinent positive and negative review of systems were noted in the above HPI section. All other review of systems were otherwise negative.    Current Medications, Allergies, Past Medical History, Past Surgical History, Family History and Social History were reviewed in [facility]  Vital signs were reviewed in today's medical record. Physical Exam: General: Well developed , well nourished, no acute distress   See Assessment and Plan under Problem List

## 2014-09-07 NOTE — Patient Instructions (Signed)
Follow up as needed Cc. Kelsey ClientHannah [name],MD

## 2014-09-07 NOTE — Assessment & Plan Note (Signed)
Intermittent symptoms appear to be anxiety related

## 2014-09-07 NOTE — Assessment & Plan Note (Signed)
Symptoms are well controlled with ranitidine.

## 2014-09-07 NOTE — Assessment & Plan Note (Signed)
symptoms are well-controlled with fiber supplementation

## 2014-09-26 ENCOUNTER — Ambulatory Visit (INDEPENDENT_AMBULATORY_CARE_PROVIDER_SITE_OTHER): Payer: 59 | Admitting: Neurology

## 2014-09-26 ENCOUNTER — Encounter: Payer: Self-pay | Admitting: Neurology

## 2014-09-26 VITALS — BP 100/72 | HR 97 | Resp 16 | Ht 64.25 in | Wt 140.0 lb

## 2014-09-26 DIAGNOSIS — R51 Headache: Secondary | ICD-10-CM

## 2014-09-26 DIAGNOSIS — F0781 Postconcussional syndrome: Secondary | ICD-10-CM

## 2014-09-26 DIAGNOSIS — R519 Headache, unspecified: Secondary | ICD-10-CM

## 2014-09-26 MED ORDER — AMITRIPTYLINE HCL 10 MG PO TABS: ORAL_TABLET | ORAL | Status: DC

## 2014-09-26 NOTE — Progress Notes (Signed)
NEUROLOGY FOLLOW UP OFFICE NOTE  Brayleigh Rybacki [id number]  HISTORY OF PRESENT ILLNESS: I had the pleasure of seeing Harbour Nordmeyer in follow-up in the neurology clinic on 09/26/2014.  The patient was last seen 2 months ago for chronic daily headaches after a concussion in June 2014. She has been trying to get pregnant and requested a taper off amitriptyline, however on her last visit reported worsened headaches, nausea, vomiting, and sleep difficulties. After discussing risk vs benefits, we agreed to restart low dose amitriptyline. She is doing very well on current low dose amitriptyline 12.20m qhs. She has had days where she is headache-free and brings her calendar, showing only 5 migraines in April, 3 so far this month. She has seen an ophthalmologist and has been told her eye exam is normal and that vision issues are due not due to her eyes but due to the brain. She denies any focal numbness/tingling/weakness. She still has dizziness but also improved. She continues daily magnesium and riboflavin for headache prophylaxis.  HPI: This is a pleasant 34yo RH woman with a history of vestibular neuritis with chronic intermittent vertigo, GERD, IBS, and post-concussive headaches. [place] [facility] [place] [facility] In June [facility], she woke up early morning feeling flushed with GI upset, went to the bathroom and passed out, waking up on the bathroom floor with cuts around her left eye, requiring 12 stitches. A week later, she was back to work then felt that the building was rocking back and forth. She was diagnosed with a concussion and did a month of physical and cognitive rest. She started having daily headaches described as a dull constant pressure with specific triggers, particularly computer screens, busy environments like grocery stores, bright environments, focusing on crafts. Worst headaches are stabbing/throbbing pains in the back/right side of the head with  dizziness, tightening sensation at the crown or left side of the head. There is occasional nausea, photo and phonophobia. Headaches worse with looking at screens or long periods of concentration. When bad headaches start, they will often last all day. She has continuous 6 or 7 over 10 pain with exacerbations 5-6 times a week up to 9 or 10 over 10. Once a week she will wake up with bad headache. No aura symptoms such as visual changes, however she feels that her vision has been blurred. She has seen neuro-ophthalmology and cleared from their standpoint, prescribed prescription lenses that helped with the blurred vision but not the headaches. She has been taking amitriptyline for IBS.She had noted some word-finding difficulties after the concussion and had neuropsychological [place] in [place] she tells me her memory is okay per testing. There is a family history of migraines in her uncle, her mother has headaches.  She continues to have occasional vertigo when lying in bed. Turning her head does not help. She has stopped taking meclizine. She did vestibular therapy in [place] for several months, with eye tracking and vestibular exercises. She reports a history of panic attacks and has used ativan in the past. She had mild depression in the past.  Diagnostic Data per [facility]: 12/2012: MRI OF THE BRAIN WITHOUT INTRAVENOUS CONTRAST Impression:  Unremarkable MRI of the brain.  MRV Brain:  IMPRESSION: No evidence for cortical or dural venous thrombosis.  PAST MEDICAL HISTORY: Past Medical History  Diagnosis Date  . Asthma   . Depression   . GERD (gastroesophageal reflux disease)   . Frequent headaches   .  Migraines   . IBS (irritable bowel syndrome)   . Panic attacks   . Vertigo 12/31/2013  . Anxiety and depression     MEDICATIONS: Current Outpatient Prescriptions on File Prior to Visit  Medication Sig Dispense Refill  . amitriptyline (ELAVIL) 25 MG tablet Take  12.5 mg by mouth at bedtime.    . Biotin 2500 MCG CAPS Take by mouth.    Lyndle Herrlich SULFATE PO Take 65 mg by mouth.    . Flaxseed, Linseed, (FLAX SEED OIL PO) Take by mouth.    . fluticasone (FLONASE) 50 MCG/ACT nasal spray Place 2 sprays into both nostrils daily. 16 g 6  . LORazepam (ATIVAN) 0.5 MG tablet Take 0.5 mg by mouth every 8 (eight) hours as needed for anxiety (uses rarely for panic disorder).    . Magnesium 400 MG CAPS Take 400 mg by mouth daily.    . Melatonin 3 MG CAPS Take 3 mg by mouth at bedtime.    Marland Kitchen omeprazole (PRILOSEC) 20 MG capsule Take 20 mg by mouth as needed.     . Prenatal Vit-Fe Fumarate-FA (PRENATAL MULTIVITAMIN) TABS tablet Take 1 tablet by mouth daily at 12 noon.    . ranitidine (ACID REDUCER) 150 MG tablet Take 150 mg by mouth 2 (two) times daily.    . riboflavin (VITAMIN B-2) 100 MG TABS tablet 100 mg. Takes 2 every am & 2 every pm    . Sulfacetamide Sodium 10 % SUSP Apply to affected area once daily 118 mL 3  . albuterol (PROVENTIL HFA) 108 (90 BASE) MCG/ACT inhaler Inhale 2 puffs into the lungs every 4 (four) hours as needed. (Patient not taking: Reported on 09/26/2014) 3 Inhaler 0  . meclizine (ANTIVERT) 12.5 MG tablet Take 12.5 mg by mouth 3 (three) times daily as needed for dizziness.     No current facility-administered medications on file prior to visit.    ALLERGIES: Allergies  Allergen Reactions  . Catfish [Fish Allergy] Shortness Of Breath  . Citric Acid     Per patient report    FAMILY HISTORY: Family History  Problem Relation Age of Onset  . Hypertension Mother   . Cancer Mother     multiple myeloma  . Hypertension Father   . Stroke Father   . Cancer Maternal Grandmother     CLL    SOCIAL HISTORY: History   Social History  . Marital Status: Married    Spouse Name: N/A  . Number of Children: 0  . Years of Education: N/A   Occupational History  . Not on file.   Social History Main Topics  . Smoking status: Never Smoker   .  Smokeless tobacco: Never Used  . Alcohol Use: No  . Drug Use: No  . Sexual Activity: Not on file   Other Topics Concern  . 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      Home Situation: lives with husband      Spiritual Beliefs: Christian      Lifestyle: doing 20 minutes of exercise and a little weight lifting daily; diet is healthy             REVIEW OF SYSTEMS: Constitutional: No fevers, chills, or sweats, no generalized fatigue, change in appetite Eyes: No visual changes, double vision, eye pain Ear, nose and throat: No hearing loss, ear pain, nasal congestion, sore throat Cardiovascular: No chest pain, palpitations Respiratory:  No shortness of  breath at rest or with exertion, wheezes GastrointestinaI: No nausea, vomiting, diarrhea, abdominal pain, fecal incontinence Genitourinary:  No dysuria, urinary retention or frequency Musculoskeletal:  No neck pain, back pain Integumentary: No rash, pruritus, skin lesions Neurological: as above Psychiatric: No depression, insomnia, +anxiety Endocrine: No palpitations, fatigue, diaphoresis, mood swings, change in appetite, change in weight, increased thirst Hematologic/Lymphatic:  No anemia, purpura, petechiae. Allergic/Immunologic: no itchy/runny eyes, nasal congestion, recent allergic reactions, rashes  PHYSICAL EXAM: Filed Vitals:   09/26/14 1018  BP: 100/72  Pulse: 97  Resp: 16   General: No acute distress Head:  Normocephalic/atraumatic Neck: supple, no paraspinal tenderness, full range of motion Heart:  Regular rate and rhythm Lungs:  Clear to auscultation bilaterally Back: No paraspinal tenderness Skin/Extremities: No rash, no edema Neurological Exam: alert and oriented to person, place, and time. No aphasia or dysarthria. Fund of knowledge is appropriate.  Recent and remote memory are intact.  Attention and concentration are normal.    Able to name objects and repeat  phrases. Cranial nerves: Pupils equal, round, reactive to light.  Fundoscopic exam unremarkable, no papilledema. Extraocular movements intact with no nystagmus. Visual fields full. Facial sensation intact. No facial asymmetry. Tongue, uvula, palate midline.  Motor: Bulk and tone normal, muscle strength 5/5 throughout with no pronator drift.  Sensation to light touch intact.  No extinction to double simultaneous stimulation.  Deep tendon reflexes 2+ throughout, toes downgoing.  Finger to nose testing intact.  Gait narrow-based and steady, able to tandem walk adequately.  Romberg negative.  IMPRESSION: This is a pleasant 34 yo RH woman with a history of vestibular neuritis with chronic intermittent vertigo, GERD, IBS, and post-concussive headaches since a fall in June 2014. She continued to have chronic daily headaches with some improvement on higher dose amitriptyline, however with pregnancy plans, requested tapering off amitriptyline. Headaches worsened off the medication, she is back on low dose 12.20m qhs with improvement in symptoms. She has days where she is headache-free. She continues to work with her OB for plans for pregnancy and will switch to amitriptyline 170mqhs (difficulty cutting 2534mablets). Continue magnesium and riboflavin. She will continue headache diary and follow-up in 3 months. She knows to call our office for any problems in the interim.  Thank you for allowing me to participate in her care.  Please do not hesitate to call for any questions or concerns.  The duration of this appointment visit was 15 minutes of face-to-face time with the patient.  Greater than 50% of this time was spent in counseling, explanation of diagnosis, planning of further management, and coordination of care.   KarEllouise Newer.D.   CC: Dr. KimMaudie Mercury

## 2014-09-26 NOTE — Patient Instructions (Signed)
1. Switch to amitriptyline 10mg  once a day 2. Continue daily Magnesium and Riboflavin 3. Continue headache calendar 4. Follow-up in 3 months

## 2014-10-03 ENCOUNTER — Encounter: Payer: Self-pay | Admitting: Family Medicine

## 2014-10-03 ENCOUNTER — Ambulatory Visit (INDEPENDENT_AMBULATORY_CARE_PROVIDER_SITE_OTHER): Payer: 59 | Admitting: Family Medicine

## 2014-10-03 ENCOUNTER — Telehealth: Payer: Self-pay | Admitting: Family Medicine

## 2014-10-03 VITALS — BP 98/70 | HR 93 | Temp 98.5°F | Ht 64.25 in | Wt 137.4 lb

## 2014-10-03 DIAGNOSIS — K529 Noninfective gastroenteritis and colitis, unspecified: Secondary | ICD-10-CM

## 2014-10-03 DIAGNOSIS — F411 Generalized anxiety disorder: Secondary | ICD-10-CM | POA: Diagnosis not present

## 2014-10-03 MED ORDER — ONDANSETRON HCL 4 MG PO TABS: 4.0000 mg | ORAL_TABLET | Freq: Three times a day (TID) | ORAL | Status: DC | PRN

## 2014-10-03 NOTE — Progress Notes (Addendum)
HPI:  Kelsey [name] is a 34 yo F with PMH sig for GAD, panic disorder, IBS, GERD and intermittent nausea and vomiting here for an acute visit for:  GAD/NVD: -sees GI for her NV and intestinal issues - worse with stress -reports in process of moving and stressed out -trying to conceive so off psych medications for some time - but reports ob and neuro put her back on low dose amitriptyline, she used to take benzos prn -increased anxiety and a few panic attacks recently with GI symptoms starting last night -symptoms: watery diarrhea - several episodes yesterday and this morning, anxiety, nausea -very anxious about using medications while trying to conceive -denies: depression, SI, change in bowels different from in the past, fevers, blood in stools, bloody or bilious emesis, melena, weight loss  -FDLMP: 09/24/14   ROS: See pertinent positives and negatives per HPI.  Past Medical History  Diagnosis Date  . Asthma   . Depression   . GERD (gastroesophageal reflux disease)   . Frequent headaches   . Migraines   . IBS (irritable bowel syndrome)   . Panic attacks   . Vertigo 12/31/2013  . Anxiety and depression     No past surgical history on file.  Family History  Problem Relation Age of Onset  . Hypertension Mother   . Cancer Mother     multiple myeloma  . Hypertension Father   . Stroke Father   . Cancer Maternal Grandmother     CLL    History   Social History  . Marital Status: Married    Spouse Name: N/A  . Number of Children: 0  . Years of Education: N/A   Social History Main Topics  . Smoking status: Never Smoker   . Smokeless tobacco: Never Used  . Alcohol Use: No  . Drug Use: No  . Sexual Activity: Not on file   Other Topics Concern  . 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  albuterol (PROVENTIL HFA) 108 (90 BASE) MCG/ACT inhaler, Inhale 2 puffs into the lungs every 4 (four) hours as needed., Disp: 3 Inhaler, Rfl: 0 .  amitriptyline (ELAVIL) 10 MG tablet, Take 1 tablet daily, Disp: 30 tablet, Rfl: 5 .  Biotin 2500 MCG CAPS, Take by mouth., Disp: , Rfl:  .  FERROUS SULFATE PO, Take 65 mg by mouth., Disp: , Rfl:  .  Flaxseed, Linseed, (FLAX SEED OIL PO), Take by mouth., Disp: , Rfl:  .  fluticasone (FLONASE) 50 MCG/ACT nasal spray, Place 2 sprays into both nostrils daily., Disp: 16 g, Rfl: 6 .  LORazepam (ATIVAN) 0.5 MG tablet, Take 0.5 mg by mouth every 8 (eight) hours as needed for anxiety (uses rarely for panic disorder)., Disp: , Rfl:  .  Magnesium 400 MG CAPS, Take 400 mg by mouth daily., Disp: , Rfl:  .  meclizine (ANTIVERT) 12.5 MG tablet, Take 12.5 mg by mouth 3 (three) times daily as needed for dizziness., Disp: , Rfl:  .  Melatonin 3 MG CAPS, Take 3 mg by mouth at bedtime., Disp: , Rfl:  .  omeprazole (PRILOSEC) 20 MG capsule,  Take 20 mg by mouth as needed. , Disp: , Rfl:  .  Prenatal Vit-Fe Fumarate-FA (PRENATAL MULTIVITAMIN) TABS tablet, Take 1 tablet by mouth daily at 12 noon., Disp: , Rfl:  .  ranitidine (ACID REDUCER) 150 MG tablet, Take 150 mg by mouth 2 (two) times daily., Disp: , Rfl:  .  riboflavin (VITAMIN B-2) 100 MG TABS tablet, 100 mg. Takes 2 every am & 2 every pm, Disp: , Rfl:  .  Sulfacetamide Sodium 10 % SUSP, Apply to affected area once daily, Disp: 118 mL, Rfl: 3 .  ondansetron (ZOFRAN) 4 MG tablet, Take 1 tablet (4 mg total) by mouth every 8 (eight) hours as needed for nausea or vomiting., Disp: 10 tablet, Rfl: 0  EXAM:  Filed Vitals:   10/03/14 1048  BP: 98/70  Pulse: 93  Temp: 98.5 F (36.9 C)    Body mass index is 23.4 kg/(m^2).  GENERAL: vitals reviewed and listed above, alert, oriented, appears well hydrated and in no acute  distress  HEENT: atraumatic, conjunttiva clear, no obvious abnormalities on inspection of external nose and ears  NECK: no obvious masses on inspection  LUNGS: clear to auscultation bilaterally, no wheezes, rales or rhonchi, good air movement  CV: HRRR, no peripheral edema  ABD: BS+, soft, NTTP  MS: moves all extremities without noticeable abnormality  PSYCH: pleasant and cooperative, no obvious depression or anxiety  ASSESSMENT AND PLAN:  Discussed the following assessment and plan:  Generalized anxiety disorder  Gastroenteritis - Plan: ondansetron (ZOFRAN) 4 MG tablet  -advised CBT -ok to use benzo rarely prn for severe anxiety if ok with her ob, not currently pregnant  -likely gastroenteritis, no alarm symptoms -supportive care, PO fluids, no dairy, imodium, zofran prn - she will discuss with ob -follow up with GI if NVD persist or are different then in the past -Patient advised to return or notify a doctor immediately if symptoms worsen or persist or new concerns arise.  Patient Instructions  -please check with your obstetrician to see if it would be ok for you to take some ativan occasionally if needed for panic attacks  -imodium is over the countner for the diarrhea  -zofran if needed for the nausea - you can also check with your ob on this  -follow up as needed  -no dairy for 7 days     Kelsey [name] R.

## 2014-10-03 NOTE — Patient Instructions (Addendum)
-  please check with your obstetrician to see if it would be ok for you to take some ativan occasionally if needed for panic attacks  -imodium is over the countner for the diarrhea  -zofran if needed for the nausea - you can also check with your ob on this  -follow up as needed  -no dairy for 7 days

## 2014-10-03 NOTE — Telephone Encounter (Signed)
Patient was seen today by Dr.[name].

## 2014-10-03 NOTE — Telephone Encounter (Signed)
[facility] - Client TELEPHONE ADVICE RECORD [facility]  Patient Name: Kelsey [name]  DOB: [birth year]    Initial Comment Caller states since dinner last night, she has been having stomach pain and nauseous. No appetite   Nurse Assessment  Nurse: Kiribatiorth, RN, Amy Date/Time (Eastern Time): 10/03/2014 8:17:36 AM  Confirm and document reason for call. If symptomatic, describe symptoms. ---STOMACH PAIN, NAUSEA, DIARRHEA. SHE STATES SHE IS HAVING SOME TINGLING SENSATION IN HER LIMBS. SHE IS HAVING SOME PANIC ATTACKS AS WELL. SHE STATES THAT SHE HAS SOME ATIVAN THAT SHE HAS PRESCRIBED BUT THAT HER HUSBAND WILL NOT LET HER TAKE IT DUE TO THEM TRYING TO CONCEIVE. SHE STATES THEY ARE MOVING AND PACKING. SHE IS JUST HAVING SOME ANXIETY AND SOMETHING SHE ATE DUE TO IT STARTING WITH HER MEAL. SHE STATES THAT THESE SYMPTOMS SHE'S HAD BEFORE THAT THEY HAVE JUST CONTINUED FROM LAST NIGHT.  Has the patient traveled out of the country within the last 30 days? ---Not Applicable  Does the patient require triage? ---Yes  Related visit to physician within the last 2 weeks? ---No  Does the PT have any chronic conditions? (i.e. diabetes, asthma, etc.) ---Yes  List chronic conditions. ---ANXIETY, STOMACH, MIGRAINES, CAR SICK, ASTHMA,  Did the patient indicate they were pregnant? ---No   TRYING TO CONCEIVE     Guidelines    Guideline Title Affirmed Question Affirmed Notes  Abdominal Pain - Female [1] MILD-MODERATE pain AND [2] constant AND [3] present > 2 hours    Final Disposition User   See Physician within 4 Hours (or PCP triage) Kiribatiorth, RN, Amy    Comments  INSTRUCTED CALLER IF ANYTHING CHANGES OR WORSENING IN SYMPTOMS TO GIVE A CALL BACK AND SHE WOULD BE REFERRED INTO THE ED. SHE VERBALIZED UNDERSTANDING.

## 2014-10-03 NOTE — Progress Notes (Signed)
Pre visit review using our clinic review tool, if applicable. No additional management support is needed unless otherwise documented below in the visit note. 

## 2014-10-20 ENCOUNTER — Encounter: Payer: Self-pay | Admitting: Family Medicine

## 2014-10-20 ENCOUNTER — Ambulatory Visit (INDEPENDENT_AMBULATORY_CARE_PROVIDER_SITE_OTHER): Payer: 59 | Admitting: Family Medicine

## 2014-10-20 VITALS — BP 102/70 | HR 102 | Temp 98.4°F | Ht 64.25 in | Wt 137.9 lb

## 2014-10-20 DIAGNOSIS — R110 Nausea: Secondary | ICD-10-CM

## 2014-10-20 DIAGNOSIS — Z3200 Encounter for pregnancy test, result unknown: Secondary | ICD-10-CM | POA: Diagnosis not present

## 2014-10-20 DIAGNOSIS — K589 Irritable bowel syndrome without diarrhea: Secondary | ICD-10-CM

## 2014-10-20 DIAGNOSIS — F411 Generalized anxiety disorder: Secondary | ICD-10-CM | POA: Diagnosis not present

## 2014-10-20 LAB — POCT URINE PREGNANCY: Preg Test, Ur: NEGATIVE

## 2014-10-20 NOTE — Addendum Note (Signed)
Addended by: Johnella MoloneyFUNDERBURK, Kendel Pesnell A on: 10/20/2014 04:30 PM   Modules accepted: Orders

## 2014-10-20 NOTE — Telephone Encounter (Signed)
I called the pt and scheduled an appt for today at 4pm. 

## 2014-10-20 NOTE — Telephone Encounter (Signed)
Can ou help her get an appointment? Thanks.

## 2014-10-20 NOTE — Progress Notes (Signed)
Pre visit review using our clinic review tool, if applicable. No additional management support is needed unless otherwise documented below in the visit note. 

## 2014-10-20 NOTE — Progress Notes (Signed)
HPI:  Acute visit for:  NVD: -sees GI for chronic intermittent NV - worse with stress, GERD and IBS -per GI notes, NV due to anxiety - pt seeing psych for this -she has had increased stress lately and is trying to conceive and is anxious about medications -seen 3 weeks ago with what was likely a viral gastroenteritis on top of her chronic issues -reports: resolution of diarrhea, still having nausea and stomach cramps occ -denies: focal or persistent abd pain, fevers, blood in stools, melena, constipation, ongoing diarrhea, vomiting -reports some bilat breast tenderness and occ clear discharge that she plans to see her gynecologist about -FDLMP: 09/24/14  ROS: See pertinent positives and negatives per HPI.  Past Medical History  Diagnosis Date  . Asthma   . Depression   . GERD (gastroesophageal reflux disease)   . Frequent headaches   . Migraines   . IBS (irritable bowel syndrome)   . Panic attacks   . Vertigo 12/31/2013  . Anxiety and depression     No past surgical history on file.  Family History  Problem Relation Age of Onset  . Hypertension Mother   . Cancer Mother     multiple myeloma  . Hypertension Father   . Stroke Father   . Cancer Maternal Grandmother     CLL    History   Social History  . Marital Status: Married    Spouse Name: N/A  . Number of Children: 0  . Years of Education: N/A   Social History Main Topics  . Smoking status: Never Smoker   . Smokeless tobacco: Never Used  . Alcohol Use: No  . Drug Use: No  . Sexual Activity: Not on file   Other Topics Concern  . 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  albuterol (PROVENTIL HFA) 108 (90 BASE) MCG/ACT inhaler, Inhale 2  puffs into the lungs every 4 (four) hours as needed., Disp: 3 Inhaler, Rfl: 0 .  amitriptyline (ELAVIL) 10 MG tablet, Take 1 tablet daily, Disp: 30 tablet, Rfl: 5 .  Biotin 2500 MCG CAPS, Take by mouth., Disp: , Rfl:  .  FERROUS SULFATE PO, Take 65 mg by mouth., Disp: , Rfl:  .  Flaxseed, Linseed, (FLAX SEED OIL PO), Take by mouth., Disp: , Rfl:  .  fluticasone (FLONASE) 50 MCG/ACT nasal spray, Place 2 sprays into both nostrils daily., Disp: 16 g, Rfl: 6 .  LORazepam (ATIVAN) 0.5 MG tablet, Take 0.5 mg by mouth every 8 (eight) hours as needed for anxiety (uses rarely for panic disorder)., Disp: , Rfl:  .  Magnesium 400 MG CAPS, Take 400 mg by mouth daily., Disp: , Rfl:  .  meclizine (ANTIVERT) 12.5 MG tablet, Take 12.5 mg by mouth 3 (three) times daily as needed for dizziness., Disp: , Rfl:  .  Melatonin 3 MG CAPS, Take 3 mg by mouth at bedtime., Disp: , Rfl:  .  omeprazole (PRILOSEC) 20 MG capsule, Take 20 mg by mouth as needed. , Disp: , Rfl:  .  ondansetron (ZOFRAN) 4 MG tablet, Take 1 tablet (4 mg total) by mouth every 8 (eight)  hours as needed for nausea or vomiting., Disp: 10 tablet, Rfl: 0 .  Prenatal Vit-Fe Fumarate-FA (PRENATAL MULTIVITAMIN) TABS tablet, Take 1 tablet by mouth daily at 12 noon., Disp: , Rfl:  .  ranitidine (ACID REDUCER) 150 MG tablet, Take 150 mg by mouth 2 (two) times daily., Disp: , Rfl:  .  riboflavin (VITAMIN B-2) 100 MG TABS tablet, 100 mg. Takes 2 every am & 2 every pm, Disp: , Rfl:  .  Sulfacetamide Sodium 10 % SUSP, Apply to affected area once daily, Disp: 118 mL, Rfl: 3  EXAM:  Filed Vitals:   10/20/14 1604  BP: 102/70  Pulse: 102  Temp: 98.4 F (36.9 C)    Body mass index is 23.49 kg/(m^2).  GENERAL: vitals reviewed and listed above, alert, oriented, appears well hydrated and in no acute distress  HEENT: atraumatic, conjunttiva clear, no obvious abnormalities on inspection of external nose and ears  NECK: no obvious masses on inspection  LUNGS:  clear to auscultation bilaterally, no wheezes, rales or rhonchi, good air movement  CV: HRRR, no peripheral edema  ABD: BS, soft, NTTO  MS: moves all extremities without noticeable abnormality  PSYCH: pleasant and cooperative, no obvious depression or anxiety  ASSESSMENT AND PLAN:  Discussed the following assessment and plan:  Nausea without vomiting  GAD (generalized anxiety disorder)  IBS (irritable bowel syndrome)  -upreg today -symptoms improving and likely post viral along with chronic issues -advised PPI for 2 weeks if gets period and not pregnant along with her zantac, align for 1 month, follow up with psych to help with stress management and follow up with GI if persists -advised she do see her gyn promptly per her plans about the nipple pain, checking upreg today -Patient advised to return or notify a doctor immediately if symptoms worsen or persist or new concerns arise.  There are no Patient Instructions on file for this visit.   Colin Benton R.

## 2014-10-20 NOTE — Patient Instructions (Signed)
Please schedule appointment with your psychiatrist regarding the anxiety and stress  Please schedule appointment with your obstetrician   Please take Align for 1 month as instructed (available over the counter)  If period comes and not pregnant would do nexium over the counter dose once daily for 2 weeks  Follow up with your gastroenterologist if symptoms persist or worsen

## 2014-11-07 ENCOUNTER — Encounter (HOSPITAL_COMMUNITY): Payer: Self-pay | Admitting: *Deleted

## 2014-11-07 ENCOUNTER — Inpatient Hospital Stay (HOSPITAL_COMMUNITY): Payer: 59

## 2014-11-07 ENCOUNTER — Inpatient Hospital Stay (HOSPITAL_COMMUNITY)
Admission: AD | Admit: 2014-11-07 | Discharge: 2014-11-07 | Disposition: A | Discharge status: Home / Self Care | Payer: 59 | Source: Ambulatory Visit | Attending: Obstetrics and Gynecology | Admitting: Obstetrics and Gynecology

## 2014-11-07 DIAGNOSIS — K589 Irritable bowel syndrome without diarrhea: Secondary | ICD-10-CM | POA: Insufficient documentation

## 2014-11-07 DIAGNOSIS — R109 Unspecified abdominal pain: Secondary | ICD-10-CM | POA: Insufficient documentation

## 2014-11-07 DIAGNOSIS — O26899 Other specified pregnancy related conditions, unspecified trimester: Secondary | ICD-10-CM

## 2014-11-07 DIAGNOSIS — Z3A01 Less than 8 weeks gestation of pregnancy: Secondary | ICD-10-CM | POA: Diagnosis not present

## 2014-11-07 DIAGNOSIS — O210 Mild hyperemesis gravidarum: Secondary | ICD-10-CM | POA: Insufficient documentation

## 2014-11-07 DIAGNOSIS — O219 Vomiting of pregnancy, unspecified: Secondary | ICD-10-CM

## 2014-11-07 LAB — URINALYSIS, ROUTINE W REFLEX MICROSCOPIC
BILIRUBIN URINE: NEGATIVE
Glucose, UA: NEGATIVE mg/dL
HGB URINE DIPSTICK: NEGATIVE
Ketones, ur: NEGATIVE mg/dL
Nitrite: NEGATIVE
PROTEIN: NEGATIVE mg/dL
Specific Gravity, Urine: 1.01 (ref 1.005–1.030)
Urobilinogen, UA: 0.2 mg/dL (ref 0.0–1.0)
pH: 7 (ref 5.0–8.0)

## 2014-11-07 LAB — URINE MICROSCOPIC-ADD ON

## 2014-11-07 LAB — POCT PREGNANCY, URINE: Preg Test, Ur: POSITIVE — AB

## 2014-11-07 LAB — HCG, QUANTITATIVE, PREGNANCY: hCG, Beta Chain, Quant, S: 59526 m[IU]/mL — ABNORMAL HIGH (ref <5)

## 2014-11-07 MED ORDER — PROMETHAZINE HCL 25 MG PO TABS
25.0000 mg | ORAL_TABLET | Freq: Four times a day (QID) | ORAL | Status: DC | PRN
  Administered 2014-11-07: 25 mg via ORAL
  Filled 2014-11-07: qty 1

## 2014-11-07 MED ORDER — DOXYLAMINE-PYRIDOXINE 10-10 MG PO TBEC: 2.0000 | DELAYED_RELEASE_TABLET | Freq: Every day | ORAL | Status: DC

## 2014-11-07 MED ORDER — PROMETHAZINE HCL 25 MG PO TABS: 25.0000 mg | ORAL_TABLET | Freq: Four times a day (QID) | ORAL | Status: DC | PRN

## 2014-11-07 NOTE — MAU Note (Addendum)
Ongoing problems with nausea, vomiting and diarrhea   Past few wks.no meds, just supplements.  Denies bleeding.  Having abd cramping.   Friday, she could not get out of bed.  Reports 7 # wt loss in a week.  Was had confirmation of preg, blood test last Wed at [facility].

## 2014-11-07 NOTE — MAU Provider Note (Signed)
History     CSN: [id number]  Arrival date and time: 11/07/14 1206   None     Chief Complaint  Patient presents with  . Morning Sickness  . Diarrhea   HPI Comments: G1 '@[redacted]w[redacted]d'  by LMP here with worsening nausea, weakness, and poor appetite for last 3 days. Tolerating water and some solid foods. Reports loose stool once daily in am for last week along with intestinal and uterine cramping. No vomiting. No VB. No fever. She has been using Unisom and B6 and Dramamine for nausea without much relief. She reports a 7 lb weight loss in the last week. Hx of IBS (mostly diarrhea) on Amitriptyline which she has progressively decreased her dosage from 138m to 133msince March.    OB History    Gravida Para Term Preterm AB TAB SAB Ectopic Multiple Living   1               Past Medical History  Diagnosis Date  . Asthma   . Depression   . GERD (gastroesophageal reflux disease)   . Frequent headaches   . Migraines   . IBS (irritable bowel syndrome)   . Panic attacks   . Vertigo 12/31/2013  . Anxiety and depression     History reviewed. No pertinent past surgical history.  Family History  Problem Relation Age of Onset  . Hypertension Mother   . Cancer Mother     multiple myeloma  . Hypertension Father   . Stroke Father   . Cancer Maternal Grandmother     CLL    History  Substance Use Topics  . Smoking status: Never Smoker   . Smokeless tobacco: Never Used  . Alcohol Use: No    Allergies:  Allergies  Allergen Reactions  . Catfish [Fish Allergy] Shortness Of Breath  . Citric Acid     Per patient report    Prescriptions prior to admission  Medication Sig Dispense Refill Last Dose  . albuterol (PROVENTIL HFA) 108 (90 BASE) MCG/ACT inhaler Inhale 2 puffs into the lungs every 4 (four) hours as needed. 3 Inhaler 0 Taking  . amitriptyline (ELAVIL) 10 MG tablet Take 1 tablet daily 30 tablet 5 Taking  . Biotin 2500 MCG CAPS Take by mouth.   Taking  . FERROUS SULFATE PO Take 65  mg by mouth.   Taking  . Flaxseed, Linseed, (FLAX SEED OIL PO) Take by mouth.   Taking  . fluticasone (FLONASE) 50 MCG/ACT nasal spray Place 2 sprays into both nostrils daily. 16 g 6 Taking  . LORazepam (ATIVAN) 0.5 MG tablet Take 0.5 mg by mouth every 8 (eight) hours as needed for anxiety (uses rarely for panic disorder).   Taking  . Magnesium 400 MG CAPS Take 400 mg by mouth daily.   Taking  . meclizine (ANTIVERT) 12.5 MG tablet Take 12.5 mg by mouth 3 (three) times daily as needed for dizziness.   Taking  . Melatonin 3 MG CAPS Take 3 mg by mouth at bedtime.   Taking  . omeprazole (PRILOSEC) 20 MG capsule Take 20 mg by mouth as needed.    Taking  . ondansetron (ZOFRAN) 4 MG tablet Take 1 tablet (4 mg total) by mouth every 8 (eight) hours as needed for nausea or vomiting. 10 tablet 0 Taking  . Prenatal Vit-Fe Fumarate-FA (PRENATAL MULTIVITAMIN) TABS tablet Take 1 tablet by mouth daily at 12 noon.   Taking  . ranitidine (ACID REDUCER) 150 MG tablet Take 150 mg by mouth 2 (  two) times daily.   Taking  . riboflavin (VITAMIN B-2) 100 MG TABS tablet 100 mg. Takes 2 every am & 2 every pm   Taking  . Sulfacetamide Sodium 10 % SUSP Apply to affected area once daily 118 mL 3 Taking    Review of Systems  Constitutional: Positive for weight loss.  Eyes: Negative.   Respiratory: Negative.   Cardiovascular: Negative.   Gastrointestinal: Positive for nausea and abdominal pain. Negative for vomiting.  Genitourinary: Positive for dysuria.  Musculoskeletal: Negative.   Skin: Negative.   Neurological: Positive for headaches.  Endo/Heme/Allergies: Negative.   Psychiatric/Behavioral: Negative.    Physical Exam   Blood pressure 115/75, pulse 81, temperature 98.1 F (36.7 C), temperature source Oral, resp. rate 18, height 5' 3.5" (1.613 m), weight 60.328 kg (133 lb), last menstrual period 09/24/2014.  Physical Exam  Constitutional: She is oriented to person, place, and time. She appears well-developed.   HENT:  Head: Normocephalic and atraumatic.  Neck: Normal range of motion. Neck supple.  Cardiovascular: Normal rate and regular rhythm.   Respiratory: Effort normal and breath sounds normal.  GI: Soft. Bowel sounds are normal.  Genitourinary:  deferred  Musculoskeletal: Normal range of motion.  Neurological: She is alert and oriented to person, place, and time.  Skin: Skin is warm and dry.  Psychiatric: She has a normal mood and affect.  Results for Kelsey, [name] (MRN [id number]) as of 11/07/2014 16:37  Ref. Range 11/07/2014 12:15 11/07/2014 12:32 11/07/2014 13:33  HCG, Beta Chain, Quant, S Latest Ref Range: <5 mIU/mL   59526 (H)  Preg Test, Ur Latest Ref Range: NEGATIVE   POSITIVE (A)   Appearance Latest Ref Range: CLEAR  CLEAR    Bacteria, UA Latest Ref Range: RARE  MANY (A)    Bilirubin Urine Latest Ref Range: NEGATIVE  NEGATIVE    Color, Urine Latest Ref Range: YELLOW  YELLOW    Glucose Latest Ref Range: NEGATIVE mg/dL NEGATIVE    Hgb urine dipstick Latest Ref Range: NEGATIVE  NEGATIVE    Ketones, ur Latest Ref Range: NEGATIVE mg/dL NEGATIVE    Leukocytes, UA Latest Ref Range: NEGATIVE  SMALL (A)    Nitrite Latest Ref Range: NEGATIVE  NEGATIVE    pH Latest Ref Range: 5.0-8.0  7.0    Protein Latest Ref Range: NEGATIVE mg/dL NEGATIVE    RBC / HPF Latest Ref Range: <3 RBC/hpf 0-2    Specific Gravity, Urine Latest Ref Range: 1.005-1.030  1.010    Squamous Epithelial / LPF Latest Ref Range: RARE  RARE    Urobilinogen, UA Latest Ref Range: 0.0-1.0 mg/dL 0.2    WBC, UA Latest Ref Range: <3 WBC/hpf 3-6    SONO: CLINICAL DATA: Pregnant, left lower quadrant pain, nausea/vomiting  EXAM: OBSTETRIC <14 WK [place] AND TRANSVAGINAL OB US  TECHNIQUE: Both transabdominal and transvaginal ultrasound examinations were performed for complete evaluation of the gestation as well as the maternal uterus, adnexal regions, and pelvic cul-de-sac. Transvaginal technique was performed to assess early  pregnancy.  COMPARISON: None.  FINDINGS: Intrauterine gestational sac: Visualized/normal in shape.  Yolk sac: Present  Embryo: Present  Cardiac Activity: Present  Heart Rate: 114 bpm  CRL: 2.3 mm 5 w 6 d [place] EDC: 07/04/2015  Maternal uterus/adnexae: Small subchronic hemorrhage.  Bilateral ovaries are within normal limits, noting a right corpus luteal cyst.  No free fluid.  IMPRESSION: Single live intrauterine gestation with estimated gestational age [redacted] weeks 6 days by crown-rump length.    MAU Course  Procedures Promethazine 25 mg po x1 dose-pt reports some improvement of nausea-tolerating water w/o problem  Assessment and Plan  30w2dIUP Nausea and vomiting of pregnancy-no evidence of dehydration Cramping in pregnancy-likely physiologic IBS exacerbation  Discharge home Maintain po hydration and nutrition Promethazine 25 mg po q6 hrs prn Diclegis 2 po at hs, may add one in am and one in afternoon Continue Zantac 150 mg po daily Continue Amitriptyline-consider increasing dose [facility] [facility] 11/07/2014, 1:08 PM

## 2014-11-07 NOTE — MAU Note (Signed)
Pt denies vomiting. States she just has severe nausea

## 2014-11-07 NOTE — Discharge Instructions (Signed)
Abdominal Pain During Pregnancy Belly (abdominal) pain is common during pregnancy. Most of the time, it is not a serious problem. Other times, it can be a sign that something is wrong with the pregnancy. Always tell your doctor if you have belly pain. HOME CARE Monitor your belly pain for any changes. The following actions may help you feel better:  Do not have sex (intercourse) or put anything in your vagina until you feel better.  Rest until your pain stops.  Drink clear fluids if you feel sick to your stomach (nauseous). Do not eat solid food until you feel better.  Only take medicine as told by your doctor.  Keep all doctor visits as told. GET HELP RIGHT AWAY IF:   You are bleeding, leaking fluid, or pieces of tissue come out of your vagina.  You have more pain or cramping.  You keep throwing up (vomiting).  You have pain when you pee (urinate) or have blood in your pee.  You have a fever.  You do not feel your baby moving as much.  You feel very weak or feel like passing out.  You have trouble breathing, with or without belly pain.  You have a very bad headache and belly pain.  You have fluid leaking from your vagina and belly pain.  You keep having watery poop (diarrhea).  Your belly pain does not go away after resting, or the pain gets worse. MAKE SURE YOU:   Understand these instructions.  Will watch your condition.  Will get help right away if you are not doing well or get worse. Document Released: 04/24/2009 Document Revised: 01/06/2013 Document Reviewed: 12/03/2012 [facility] Patient Information 2015 [place]. This information is not intended to replace advice given to you by your health care provider. Make sure you discuss any questions you have with your health care provider. First Trimester of Pregnancy The first trimester of pregnancy is from week 1 until the end of week 12 (months 1 through 3). A week after a sperm fertilizes an egg, the egg will  implant on the wall of the uterus. This embryo will begin to develop into a baby. Genes from you and your partner are forming the baby. The female genes determine whether the baby is a boy or a girl. At 6-8 weeks, the eyes and face are formed, and the heartbeat can be seen on ultrasound. At the end of 12 weeks, all the baby's organs are formed.  Now that you are pregnant, you will want to do everything you can to have a healthy baby. Two of the most important things are to get good prenatal care and to follow your health care provider's instructions. Prenatal care is all the medical care you receive before the baby's birth. This care will help prevent, find, and treat any problems during the pregnancy and childbirth. BODY CHANGES Your body goes through many changes during pregnancy. The changes vary from woman to woman.   You may gain or lose a couple of pounds at first.  You may feel sick to your stomach (nauseous) and throw up (vomit). If the vomiting is uncontrollable, call your health care provider.  You may tire easily.  You may develop headaches that can be relieved by medicines approved by your health care provider.  You may urinate more often. Painful urination may mean you have a bladder infection.  You may develop heartburn as a result of your pregnancy.  You may develop constipation because certain hormones are causing the  muscles that push waste through your intestines to slow down.  You may develop hemorrhoids or swollen, bulging veins (varicose veins).  Your breasts may begin to grow larger and become tender. Your nipples may stick out more, and the tissue that surrounds them (areola) may become darker.  Your gums may bleed and may be sensitive to brushing and flossing.  Dark spots or blotches (chloasma, mask of pregnancy) may develop on your face. This will likely fade after the baby is born.  Your menstrual periods will stop.  You may have a loss of appetite.  You may  develop cravings for certain kinds of food.  You may have changes in your emotions from day to day, such as being excited to be pregnant or being concerned that something may go wrong with the pregnancy and baby.  You may have more vivid and strange dreams.  You may have changes in your hair. These can include thickening of your hair, rapid growth, and changes in texture. Some women also have hair loss during or after pregnancy, or hair that feels dry or thin. Your hair will most likely return to normal after your baby is born. WHAT TO EXPECT AT YOUR PRENATAL VISITS During a routine prenatal visit:  You will be weighed to make sure you and the baby are growing normally.  Your blood pressure will be taken.  Your abdomen will be measured to track your baby's growth.  The fetal heartbeat will be listened to starting around week 10 or 12 of your pregnancy.  Test results from any previous visits will be discussed. Your health care provider may ask you:  How you are feeling.  If you are feeling the baby move.  If you have had any abnormal symptoms, such as leaking fluid, bleeding, severe headaches, or abdominal cramping.  If you have any questions. Other tests that may be performed during your first trimester include:  Blood tests to find your blood type and to check for the presence of any previous infections. They will also be used to check for low iron levels (anemia) and Rh antibodies. Later in the pregnancy, blood tests for diabetes will be done along with other tests if problems develop.  Urine tests to check for infections, diabetes, or protein in the urine.  An ultrasound to confirm the proper growth and development of the baby.  An amniocentesis to check for possible genetic problems.  Fetal screens for spina bifida and Down syndrome.  You may need other tests to make sure you and the baby are doing well. HOME CARE INSTRUCTIONS  Medicines  Follow your health care  provider's instructions regarding medicine use. Specific medicines may be either safe or unsafe to take during pregnancy.  Take your prenatal vitamins as directed.  If you develop constipation, try taking a stool softener if your health care provider approves. Diet  Eat regular, well-balanced meals. Choose a variety of foods, such as meat or vegetable-based protein, fish, milk and low-fat dairy products, vegetables, fruits, and whole grain breads and cereals. Your health care provider will help you determine the amount of weight gain that is right for you.  Avoid raw meat and uncooked cheese. These carry germs that can cause birth defects in the baby.  Eating four or five small meals rather than three large meals a day may help relieve nausea and vomiting. If you start to feel nauseous, eating a few soda crackers can be helpful. Drinking liquids between meals instead of during meals also  seems to help nausea and vomiting.  If you develop constipation, eat more high-fiber foods, such as fresh vegetables or fruit and whole grains. Drink enough fluids to keep your urine clear or pale yellow. Activity and Exercise  Exercise only as directed by your health care provider. Exercising will help you:  Control your weight.  Stay in shape.  Be prepared for labor and delivery.  Experiencing pain or cramping in the lower abdomen or low back is a good sign that you should stop exercising. Check with your health care provider before continuing normal exercises.  Try to avoid standing for long periods of time. Move your legs often if you must stand in one place for a long time.  Avoid heavy lifting.  Wear low-heeled shoes, and practice good posture.  You may continue to have sex unless your health care provider directs you otherwise. Relief of Pain or Discomfort  Wear a good support bra for breast tenderness.   Take warm sitz baths to soothe any pain or discomfort caused by hemorrhoids. Use  hemorrhoid cream if your health care provider approves.   Rest with your legs elevated if you have leg cramps or low back pain.  If you develop varicose veins in your legs, wear support hose. Elevate your feet for 15 minutes, 3-4 times a day. Limit salt in your diet. Prenatal Care  Schedule your prenatal visits by the twelfth week of pregnancy. They are usually scheduled monthly at first, then more often in the last 2 months before delivery.  Write down your questions. Take them to your prenatal visits.  Keep all your prenatal visits as directed by your health care provider. Safety  Wear your seat belt at all times when driving.  Make a list of emergency phone numbers, including numbers for family, friends, the hospital, and police and fire departments. General Tips  Ask your health care provider for a referral to a local prenatal education class. Begin classes no later than at the beginning of month 6 of your pregnancy.  Ask for help if you have counseling or nutritional needs during pregnancy. Your health care provider can offer advice or refer you to specialists for help with various needs.  Do not use hot tubs, steam rooms, or saunas.  Do not douche or use tampons or scented sanitary pads.  Do not cross your legs for long periods of time.  Avoid cat litter boxes and soil used by cats. These carry germs that can cause birth defects in the baby and possibly loss of the fetus by miscarriage or stillbirth.  Avoid all smoking, herbs, alcohol, and medicines not prescribed by your health care provider. Chemicals in these affect the formation and growth of the baby.  Schedule a dentist appointment. At home, brush your teeth with a soft toothbrush and be gentle when you floss. SEEK MEDICAL CARE IF:   You have dizziness.  You have mild pelvic cramps, pelvic pressure, or nagging pain in the abdominal area.  You have persistent nausea, vomiting, or diarrhea.  You have a bad  smelling vaginal discharge.  You have pain with urination.  You notice increased swelling in your face, hands, legs, or ankles. SEEK IMMEDIATE MEDICAL CARE IF:   You have a fever.  You are leaking fluid from your vagina.  You have spotting or bleeding from your vagina.  You have severe abdominal cramping or pain.  You have rapid weight gain or loss.  You vomit blood or material that looks like coffee  grounds.  You are exposed to [place] measles and have never had them.  You are exposed to fifth disease or chickenpox.  You develop a severe headache.  You have shortness of breath.  You have any kind of trauma, such as from a fall or a car accident. Document Released: 04/30/2001 Document Revised: 09/20/2013 Document Reviewed: 03/16/2013 [facility] Patient Information 2015 [place], [place]. This information is not intended to replace advice given to you by your health care provider. Make sure you discuss any questions you have with your health care provider. Vaginal Bleeding During Pregnancy, First Trimester A small amount of bleeding (spotting) from the vagina is relatively common in early pregnancy. It usually stops on its own. Various things may cause bleeding or spotting in early pregnancy. Some bleeding may be related to the pregnancy, and some may not. In most cases, the bleeding is normal and is not a problem. However, bleeding can also be a sign of something serious. Be sure to tell your health care provider about any vaginal bleeding right away. Some possible causes of vaginal bleeding during the first trimester include:  Infection or inflammation of the cervix.  Growths (polyps) on the cervix.  Miscarriage or threatened miscarriage.  Pregnancy tissue has developed outside of the uterus and in a fallopian tube (tubal pregnancy).  Tiny cysts have developed in the uterus instead of pregnancy tissue (molar pregnancy). HOME CARE INSTRUCTIONS  Watch your condition for any  changes. The following actions may help to lessen any discomfort you are feeling:  Follow your health care provider's instructions for limiting your activity. If your health care provider orders bed rest, you may need to stay in bed and only get up to use the bathroom. However, your health care provider may allow you to continue light activity.  If needed, make plans for someone to help with your regular activities and responsibilities while you are on bed rest.  Keep track of the number of pads you use each day, how often you change pads, and how soaked (saturated) they are. Write this down.  Do not use tampons. Do not douche.  Do not have sexual intercourse or orgasms until approved by your health care provider.  If you pass any tissue from your vagina, save the tissue so you can show it to your health care provider.  Only take over-the-counter or prescription medicines as directed by your health care provider.  Do not take aspirin because it can make you bleed.  Keep all follow-up appointments as directed by your health care provider. SEEK MEDICAL CARE IF:  You have any vaginal bleeding during any part of your pregnancy.  You have cramps or labor pains.  You have a fever, not controlled by medicine. SEEK IMMEDIATE MEDICAL CARE IF:   You have severe cramps in your back or belly (abdomen).  You pass large clots or tissue from your vagina.  Your bleeding increases.  You feel light-headed or weak, or you have fainting episodes.  You have chills.  You are leaking fluid or have a gush of fluid from your vagina.  You pass out while having a bowel movement. MAKE SURE YOU:  Understand these instructions.  Will watch your condition.  Will get help right away if you are not doing well or get worse. Document Released: 02/13/2005 Document Revised: 05/11/2013 Document Reviewed: 01/11/2013 [facility] Patient Information 2015 [place], [place]. This information is not intended to  replace advice given to you by your health care provider. Make sure you discuss  any questions you have with your health care provider.    Pick up prescriptions for:  Zantac Phenergan Diclegis

## 2014-11-23 LAB — OB RESULTS CONSOLE HEPATITIS B SURFACE ANTIGEN: Hepatitis B Surface Ag: NEGATIVE

## 2014-11-23 LAB — OB RESULTS CONSOLE ABO/RH: RH Type: POSITIVE

## 2014-11-23 LAB — OB RESULTS CONSOLE ANTIBODY SCREEN: Antibody Screen: NEGATIVE

## 2014-11-23 LAB — OB RESULTS CONSOLE HIV ANTIBODY (ROUTINE TESTING): HIV: NONREACTIVE

## 2014-11-23 LAB — OB RESULTS CONSOLE RUBELLA ANTIBODY, IGM: RUBELLA: IMMUNE

## 2014-11-23 LAB — OB RESULTS CONSOLE RPR: RPR: NONREACTIVE

## 2014-11-25 ENCOUNTER — Telehealth: Payer: Self-pay | Admitting: Physician Assistant

## 2014-11-29 NOTE — Telephone Encounter (Signed)
Pt is calling back and said she has not received a return call and would like one today

## 2014-11-29 NOTE — Telephone Encounter (Signed)
Pt has Hep C antibody and Hep B surface antigen rflx con drawn 08/25/14 that was ordered by Gay FillerLori Hvozdovic, [employer] Insurance company is denying coverage stating they need more information as to why the test was ordered. Insurance co is [facility] [phone]. Requesting a call to the insurance company to see if this can be covered.

## 2014-11-30 NOTE — Telephone Encounter (Signed)
I saw her in November. Dr. Arlyce DiceKaplan saw her in April

## 2014-11-30 NOTE — Telephone Encounter (Signed)
Ultrasound demonstrated mild hepatic edema raising the question of hepatitis.  Accordingly, hepatitis serologies were drawn.

## 2014-12-02 LAB — OB RESULTS CONSOLE GC/CHLAMYDIA
Chlamydia: NEGATIVE
GC PROBE AMP, GENITAL: NEGATIVE

## 2014-12-27 ENCOUNTER — Ambulatory Visit (INDEPENDENT_AMBULATORY_CARE_PROVIDER_SITE_OTHER): Payer: 59 | Admitting: Neurology

## 2014-12-27 ENCOUNTER — Encounter: Payer: Self-pay | Admitting: Neurology

## 2014-12-27 VITALS — BP 100/70 | HR 95 | Resp 16 | Ht 64.25 in | Wt 139.0 lb

## 2014-12-27 DIAGNOSIS — F0781 Postconcussional syndrome: Secondary | ICD-10-CM | POA: Diagnosis not present

## 2014-12-27 DIAGNOSIS — R42 Dizziness and giddiness: Secondary | ICD-10-CM

## 2014-12-27 DIAGNOSIS — G43909 Migraine, unspecified, not intractable, without status migrainosus: Secondary | ICD-10-CM

## 2014-12-27 MED ORDER — AMITRIPTYLINE HCL 25 MG PO TABS: ORAL_TABLET | ORAL | Status: DC

## 2014-12-27 NOTE — Patient Instructions (Signed)
1. Continue amitriptyline 12.5mg  daily, discuss higher dose  daily with your OB. If cleared, ok to increase and monitor symptoms 2. Wishing you well for the rest of the pregnancy! 3. Follow-up in 7 months, call for any problems

## 2014-12-27 NOTE — Progress Notes (Signed)
NEUROLOGY FOLLOW UP OFFICE NOTE  Kelsey [name] [id number]  HISTORY OF PRESENT ILLNESS: I had the pleasure of seeing Kelsey [name] in the neurology clinic on 12/27/2014.  The patient was last seen 3 months ago for chronic daily headaches after a concussion in June 2014. On her last visit, she reported doing well on low dose amitriptyline 12.97m qhs. Dose was further reduced to 189mqhs but she feels more sick on this and is back on 12.56m86mose with at least 1-2 migraines a week, lasting a few hours. She reports this is still better than before, where she has migraines all day and on a daily basis. She is now [redacted] weeks pregnant, with nausea and fatigue. She had to stop working, and this has helped with headache trigger avoidance. She takes Diclegis for the nausea. She has not noticed the dizziness as much. Vision is still not great, she can't read anything while in motion or upside down. She denies any focal numbness/tingling/weakness. No falls.   HPI: This is a pleasant 34 7 RH woman with a history of vestibular neuritis with chronic intermittent vertigo, GERD, IBS, and post-concussive headaches. [place] [facility] [place] [facility] In June 2014, she woke up early morning feeling flushed with GI upset, went to the bathroom and passed out, waking up on the bathroom floor with cuts around her left eye, requiring 12 stitches. A week later, she was back to work then felt that the building was rocking back and forth. She was diagnosed with a concussion and did a month of physical and cognitive rest. She started having daily headaches described as a dull constant pressure with specific triggers, particularly computer screens, busy environments like grocery stores, bright environments, focusing on crafts. Worst headaches are stabbing/throbbing pains in the back/right side of the head with dizziness, tightening sensation at the crown or left side of the head. There  is occasional nausea, photo and phonophobia. Headaches worse with looking at screens or long periods of concentration. When bad headaches start, they will often last all day. She has continuous 6 or 7 over 10 pain with exacerbations 5-6 times a week up to 9 or 10 over 10. Once a week she will wake up with bad headache. No aura symptoms such as visual changes, however she feels that her vision has been blurred. She has seen neuro-ophthalmology and cleared from their standpoint, prescribed prescription lenses that helped with the blurred vision but not the headaches. She has been taking amitriptyline for IBS.She had noted some word-finding difficulties after the concussion and [place] neuropsychological [place] her memory is okay per testing. There is a family history of migraines in her uncle, her mother has headaches.  She continues to have occasional vertigo when lying in bed. Turning her head does not help. She has stopped taking meclizine. She did vestibular therapy in [place] for several months, with eye tracking and vestibular exercises. She reports a history of panic attacks and has used ativan in the past. She had mild depression in the past.  Diagnostic Data per Epic report [facility]: 12/2012: MRI OF THE BRAIN WITHOUT INTRAVENOUS CONTRAST Impression:  Unremarkable MRI of the brain.  MRV Brain:  IMPRESSION: No evidence for cortical or dural venous thrombosis  PAST MEDICAL HISTORY: Past Medical History  Diagnosis Date  . Asthma   . Depression   . GERD (gastroesophageal reflux disease)   . Frequent headaches   .  Migraines   . IBS (irritable bowel syndrome)   . Panic attacks   . Vertigo 12/31/2013  . Anxiety and depression     MEDICATIONS: Current Outpatient Prescriptions on File Prior to Visit  Medication Sig Dispense Refill  . amitriptyline (ELAVIL) 10 MG tablet Take 1 tablet daily (Patient taking differently: Take 12.5 mg at bedtime) 30  tablet 5  . Doxylamine-Pyridoxine (DICLEGIS) 10-10 MG TBEC Take 2 tablets by mouth at bedtime. 100 tablet 1  . fluticasone (FLONASE) 50 MCG/ACT nasal spray Place 2 sprays into both nostrils daily. 16 g 6  . meclizine (ANTIVERT) 12.5 MG tablet Take 12.5 mg by mouth 3 (three) times daily as needed for dizziness.    . Prenatal Vit-Fe Fumarate-FA (PRENATAL MULTIVITAMIN) TABS tablet Take 1 tablet by mouth daily at 12 noon.    . ranitidine (ACID REDUCER) 150 MG tablet Take 75 mg by mouth 2 (two) times daily.     Marland Kitchen albuterol (PROVENTIL HFA) 108 (90 BASE) MCG/ACT inhaler Inhale 2 puffs into the lungs every 4 (four) hours as needed. (Patient not taking: Reported on 12/27/2014) 3 Inhaler 0   No current facility-administered medications on file prior to visit.    ALLERGIES: Allergies  Allergen Reactions  . Catfish [Fish Allergy] Shortness Of Breath  . Citric Acid     Per patient report    FAMILY HISTORY: Family History  Problem Relation Age of Onset  . Hypertension Mother   . Cancer Mother     multiple myeloma  . Hypertension Father   . Stroke Father   . Cancer Maternal Grandmother     CLL    SOCIAL HISTORY: History   Social History  . Marital Status: Married    Spouse Name: N/A  . Number of Children: 0  . Years of Education: N/A   Occupational History  . Not on file.   Social History Main Topics  . Smoking status: Never Smoker   . Smokeless tobacco: Never Used  . Alcohol Use: No  . Drug Use: No  . Sexual Activity: Not Currently    Birth Control/ Protection: None   Other Topics Concern  . 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      Home Situation: lives with husband      Spiritual Beliefs: Christian      Lifestyle: doing 20 minutes of exercise and a little weight lifting daily; diet is healthy             REVIEW OF SYSTEMS: Constitutional: No fevers, chills, or sweats, no generalized fatigue, change in  appetite Eyes: No visual changes, double vision, eye pain Ear, nose and throat: No hearing loss, ear pain, nasal congestion, sore throat Cardiovascular: No chest pain, palpitations Respiratory:  No shortness of breath at rest or with exertion, wheezes GastrointestinaI: + nausea, vomiting, no diarrhea, abdominal pain, fecal incontinence Genitourinary:  No dysuria, urinary retention or frequency Musculoskeletal:  No neck pain, back pain Integumentary: No rash, pruritus, skin lesions Neurological: as above Psychiatric: No depression, insomnia, anxiety Endocrine: No palpitations, fatigue, diaphoresis, mood swings, change in appetite, change in weight, increased thirst Hematologic/Lymphatic:  No anemia, purpura, petechiae. Allergic/Immunologic: no itchy/runny eyes, nasal congestion, recent allergic reactions, rashes  PHYSICAL EXAM: Filed Vitals:   12/27/14 0846  BP: 100/70  Pulse: 95  Resp: 16   General: No acute distress Head:  Normocephalic/atraumatic Neck: supple, no paraspinal tenderness, full range of motion Heart:  Regular  rate and rhythm Lungs:  Clear to auscultation bilaterally Back: No paraspinal tenderness Skin/Extremities: No rash, no edema Neurological Exam: alert and oriented to person, place, and time. No aphasia or dysarthria. Fund of knowledge is appropriate.  Recent and remote memory are intact.  Attention and concentration are normal.    Able to name objects and repeat phrases. Cranial nerves: Pupils equal, round, reactive to light.  Fundoscopic exam unremarkable, no papilledema. Extraocular movements intact with no nystagmus. Visual fields full. Facial sensation intact. No facial asymmetry. Tongue, uvula, palate midline.  Motor: Bulk and tone normal, muscle strength 5/5 throughout with no pronator drift.  Sensation to light touch intact.  No extinction to double simultaneous stimulation.  Deep tendon reflexes 2+ throughout, toes downgoing.  Finger to nose testing intact.   Gait narrow-based and steady, able to tandem walk adequately.  Romberg negative.  IMPRESSION: This is a pleasant 34 yo RH woman with a history of vestibular neuritis with chronic intermittent vertigo, GERD, IBS, and post-concussive headaches since a fall in June 2014. Headaches have improved, instead of chronic daily headaches, she now has migraines 1-2 times a week. There are days she is headache-free. Dizziness is not as noticeable as well. She is not [redacted] weeks pregnant and reports the baby is doing well but she is feeling sick. We discussed potentially increasing dose of amitriptyline back to 4m qhs, she will speak to her OB first. She will [name] after her delivery in February, and knows to call our office for any changes.   Thank you for allowing me to participate in her care.  Please do not hesitate to call for any questions or concerns.  The duration of this appointment visit was 14 minutes of face-to-face time with the patient.  Greater than 50% of this time was spent in counseling, explanation of diagnosis, planning of further management, and coordination of care.   KEllouise Newer M.D.   CC: Dr. KMaudie Mercury

## 2015-04-24 LAB — BASIC METABOLIC PANEL
BUN: 6 (ref 4–21)
CREATININE: 0.7 (ref 0.5–1.1)
GLUCOSE: 86
POTASSIUM: 5.1 (ref 3.4–5.3)
SODIUM: 138 (ref 137–147)

## 2015-04-24 LAB — HEPATIC FUNCTION PANEL
ALT: 28 (ref 7–35)
AST: 26 (ref 13–35)
BILIRUBIN, TOTAL: 0.3

## 2015-04-24 LAB — TSH: TSH: 0.71 (ref 0.41–5.90)

## 2015-05-25 ENCOUNTER — Institutional Professional Consult (permissible substitution): Payer: 59 | Admitting: Family Medicine

## 2015-05-30 ENCOUNTER — Other Ambulatory Visit: Payer: Self-pay | Admitting: Family Medicine

## 2015-05-30 NOTE — Telephone Encounter (Signed)
Dr [name]-Is this Ok to refill?note states pt is pregnant-35 weeks 3 days

## 2015-06-23 LAB — OB RESULTS CONSOLE GBS: GBS: NEGATIVE

## 2015-07-03 ENCOUNTER — Encounter (HOSPITAL_COMMUNITY): Payer: Self-pay | Admitting: *Deleted

## 2015-07-03 ENCOUNTER — Other Ambulatory Visit: Payer: Self-pay | Admitting: Obstetrics

## 2015-07-03 ENCOUNTER — Telehealth (HOSPITAL_COMMUNITY): Payer: Self-pay | Admitting: *Deleted

## 2015-07-03 NOTE — Telephone Encounter (Signed)
Preadmission screen  

## 2015-07-06 ENCOUNTER — Encounter (HOSPITAL_COMMUNITY): Payer: Self-pay | Admitting: *Deleted

## 2015-07-06 ENCOUNTER — Telehealth (HOSPITAL_COMMUNITY): Payer: Self-pay | Admitting: *Deleted

## 2015-07-06 NOTE — Telephone Encounter (Signed)
Preadmission screen  

## 2015-07-08 ENCOUNTER — Encounter (HOSPITAL_COMMUNITY)
Admission: EM | Disposition: A | Discharge status: Home / Self Care | Payer: Self-pay | Source: Ambulatory Visit | Attending: Obstetrics

## 2015-07-08 ENCOUNTER — Inpatient Hospital Stay (HOSPITAL_COMMUNITY): Admission: RE | Admit: 2015-07-08 | Payer: 59 | Source: Ambulatory Visit

## 2015-07-08 ENCOUNTER — Inpatient Hospital Stay (HOSPITAL_COMMUNITY): Payer: 59 | Admitting: Anesthesiology

## 2015-07-08 ENCOUNTER — Encounter (HOSPITAL_COMMUNITY): Payer: Self-pay

## 2015-07-08 ENCOUNTER — Inpatient Hospital Stay (HOSPITAL_COMMUNITY)
Admission: EM | Admit: 2015-07-08 | Discharge: 2015-07-11 | DRG: 766 | Disposition: A | Discharge status: Home / Self Care | Payer: 59 | Source: Ambulatory Visit | Attending: Obstetrics | Admitting: Obstetrics

## 2015-07-08 DIAGNOSIS — M791 Myalgia: Secondary | ICD-10-CM | POA: Diagnosis present

## 2015-07-08 DIAGNOSIS — O99340 Other mental disorders complicating pregnancy, unspecified trimester: Secondary | ICD-10-CM | POA: Diagnosis present

## 2015-07-08 DIAGNOSIS — O480 Post-term pregnancy: Secondary | ICD-10-CM | POA: Diagnosis present

## 2015-07-08 DIAGNOSIS — F329 Major depressive disorder, single episode, unspecified: Secondary | ICD-10-CM | POA: Diagnosis present

## 2015-07-08 DIAGNOSIS — F410 Panic disorder [episodic paroxysmal anxiety] without agoraphobia: Secondary | ICD-10-CM | POA: Diagnosis present

## 2015-07-08 DIAGNOSIS — J45909 Unspecified asthma, uncomplicated: Secondary | ICD-10-CM | POA: Diagnosis present

## 2015-07-08 DIAGNOSIS — O9962 Diseases of the digestive system complicating childbirth: Secondary | ICD-10-CM | POA: Diagnosis present

## 2015-07-08 DIAGNOSIS — K589 Irritable bowel syndrome without diarrhea: Secondary | ICD-10-CM | POA: Diagnosis present

## 2015-07-08 DIAGNOSIS — O9952 Diseases of the respiratory system complicating childbirth: Secondary | ICD-10-CM | POA: Diagnosis present

## 2015-07-08 DIAGNOSIS — Z79899 Other long term (current) drug therapy: Secondary | ICD-10-CM | POA: Diagnosis not present

## 2015-07-08 DIAGNOSIS — K219 Gastro-esophageal reflux disease without esophagitis: Secondary | ICD-10-CM | POA: Diagnosis present

## 2015-07-08 DIAGNOSIS — Z3A41 41 weeks gestation of pregnancy: Secondary | ICD-10-CM | POA: Diagnosis not present

## 2015-07-08 DIAGNOSIS — Z3490 Encounter for supervision of normal pregnancy, unspecified, unspecified trimester: Secondary | ICD-10-CM

## 2015-07-08 LAB — CBC
HCT: 38.7 % (ref 36.0–46.0)
HEMOGLOBIN: 13.5 g/dL (ref 12.0–15.0)
MCH: 30.8 pg (ref 26.0–34.0)
MCHC: 34.9 g/dL (ref 30.0–36.0)
MCV: 88.2 fL (ref 78.0–100.0)
Platelets: 195 10*3/uL (ref 150–400)
RBC: 4.39 MIL/uL (ref 3.87–5.11)
RDW: 14.2 % (ref 11.5–15.5)
WBC: 6.2 10*3/uL (ref 4.0–10.5)

## 2015-07-08 LAB — ABO/RH: ABO/RH(D): B POS

## 2015-07-08 LAB — TYPE AND SCREEN
ABO/RH(D): B POS
Antibody Screen: NEGATIVE

## 2015-07-08 LAB — RPR: RPR: NONREACTIVE

## 2015-07-08 SURGERY — Surgical Case
Anesthesia: Epidural

## 2015-07-08 MED ORDER — KETOROLAC TROMETHAMINE 30 MG/ML IJ SOLN: 30.0000 mg | Freq: Once | INTRAMUSCULAR | Status: DC | PRN

## 2015-07-08 MED ORDER — ACETAMINOPHEN 325 MG PO TABS: 650.0000 mg | ORAL_TABLET | ORAL | Status: DC | PRN

## 2015-07-08 MED ORDER — PHENYLEPHRINE 40 MCG/ML (10ML) SYRINGE FOR IV PUSH (FOR BLOOD PRESSURE SUPPORT)
80.0000 ug | PREFILLED_SYRINGE | INTRAVENOUS | Status: DC | PRN
  Administered 2015-07-08: 80 ug via INTRAVENOUS

## 2015-07-08 MED ORDER — SCOPOLAMINE 1 MG/3DAYS TD PT72
MEDICATED_PATCH | TRANSDERMAL | Status: AC
  Filled 2015-07-08: qty 1

## 2015-07-08 MED ORDER — LACTATED RINGERS IV SOLN
1.0000 m[IU]/min | INTRAVENOUS | Status: DC
  Administered 2015-07-08: 4 m[IU]/min via INTRAVENOUS

## 2015-07-08 MED ORDER — LIDOCAINE HCL (PF) 1 % IJ SOLN
INTRAMUSCULAR | Status: DC | PRN
  Administered 2015-07-08 (×2): 4 mL

## 2015-07-08 MED ORDER — ONDANSETRON HCL 4 MG/2ML IJ SOLN: 4.0000 mg | Freq: Four times a day (QID) | INTRAMUSCULAR | Status: DC | PRN

## 2015-07-08 MED ORDER — LACTATED RINGERS IV SOLN
INTRAVENOUS | Status: DC
  Administered 2015-07-08: 01:00:00 via INTRAVENOUS

## 2015-07-08 MED ORDER — WITCH HAZEL-GLYCERIN EX PADS: 1.0000 "application " | MEDICATED_PAD | CUTANEOUS | Status: DC | PRN

## 2015-07-08 MED ORDER — LANOLIN HYDROUS EX OINT: 1.0000 "application " | TOPICAL_OINTMENT | CUTANEOUS | Status: DC | PRN

## 2015-07-08 MED ORDER — TERBUTALINE SULFATE 1 MG/ML IJ SOLN: 0.2500 mg | Freq: Once | INTRAMUSCULAR | Status: DC | PRN

## 2015-07-08 MED ORDER — LACTATED RINGERS IV SOLN
INTRAVENOUS | Status: DC
  Administered 2015-07-08: 08:00:00 via INTRAVENOUS

## 2015-07-08 MED ORDER — NALBUPHINE HCL 10 MG/ML IJ SOLN: 5.0000 mg | INTRAMUSCULAR | Status: DC | PRN

## 2015-07-08 MED ORDER — OXYTOCIN 10 UNIT/ML IJ SOLN: 2.5000 [IU]/h | INTRAMUSCULAR | Status: AC

## 2015-07-08 MED ORDER — FENTANYL 2.5 MCG/ML BUPIVACAINE 1/10 % EPIDURAL INFUSION (WH - ANES)
14.0000 mL/h | INTRAMUSCULAR | Status: DC | PRN
  Administered 2015-07-08: 14 mL/h via EPIDURAL
  Filled 2015-07-08: qty 125

## 2015-07-08 MED ORDER — SIMETHICONE 80 MG PO CHEW
80.0000 mg | CHEWABLE_TABLET | Freq: Three times a day (TID) | ORAL | Status: DC
  Administered 2015-07-09 – 2015-07-11 (×5): 80 mg via ORAL
  Filled 2015-07-08 (×7): qty 1

## 2015-07-08 MED ORDER — OXYCODONE-ACETAMINOPHEN 5-325 MG PO TABS: 2.0000 | ORAL_TABLET | ORAL | Status: DC | PRN

## 2015-07-08 MED ORDER — ONDANSETRON HCL 4 MG/2ML IJ SOLN: 4.0000 mg | Freq: Three times a day (TID) | INTRAMUSCULAR | Status: DC | PRN

## 2015-07-08 MED ORDER — OXYTOCIN 10 UNIT/ML IJ SOLN
INTRAMUSCULAR | Status: AC
  Filled 2015-07-08: qty 4

## 2015-07-08 MED ORDER — MEPERIDINE HCL 25 MG/ML IJ SOLN: 6.2500 mg | INTRAMUSCULAR | Status: DC | PRN

## 2015-07-08 MED ORDER — KETOROLAC TROMETHAMINE 30 MG/ML IJ SOLN: 30.0000 mg | Freq: Four times a day (QID) | INTRAMUSCULAR | Status: AC | PRN

## 2015-07-08 MED ORDER — ONDANSETRON HCL 4 MG/2ML IJ SOLN
INTRAMUSCULAR | Status: DC | PRN
  Administered 2015-07-08: 4 mg via INTRAVENOUS

## 2015-07-08 MED ORDER — SCOPOLAMINE 1 MG/3DAYS TD PT72: 1.0000 | MEDICATED_PATCH | Freq: Once | TRANSDERMAL | Status: DC

## 2015-07-08 MED ORDER — LACTATED RINGERS IV SOLN
INTRAVENOUS | Status: DC | PRN
  Administered 2015-07-08 (×3): via INTRAVENOUS

## 2015-07-08 MED ORDER — MENTHOL 3 MG MT LOZG: 1.0000 | LOZENGE | OROMUCOSAL | Status: DC | PRN

## 2015-07-08 MED ORDER — NALBUPHINE HCL 10 MG/ML IJ SOLN: 5.0000 mg | Freq: Once | INTRAMUSCULAR | Status: DC | PRN

## 2015-07-08 MED ORDER — TETANUS-DIPHTH-ACELL PERTUSSIS 5-2.5-18.5 LF-MCG/0.5 IM SUSP: 0.5000 mL | Freq: Once | INTRAMUSCULAR | Status: DC

## 2015-07-08 MED ORDER — MORPHINE SULFATE (PF) 0.5 MG/ML IJ SOLN
INTRAMUSCULAR | Status: AC
  Filled 2015-07-08: qty 10

## 2015-07-08 MED ORDER — LIDOCAINE HCL (PF) 1 % IJ SOLN: 30.0000 mL | INTRAMUSCULAR | Status: DC | PRN

## 2015-07-08 MED ORDER — HYDROMORPHONE HCL 1 MG/ML IJ SOLN: 0.2500 mg | INTRAMUSCULAR | Status: DC | PRN

## 2015-07-08 MED ORDER — SODIUM BICARBONATE 8.4 % IV SOLN
INTRAVENOUS | Status: AC
  Filled 2015-07-08: qty 50

## 2015-07-08 MED ORDER — NALOXONE HCL 2 MG/2ML IJ SOSY: 1.0000 ug/kg/h | PREFILLED_SYRINGE | INTRAVENOUS | Status: DC | PRN

## 2015-07-08 MED ORDER — DIPHENHYDRAMINE HCL 50 MG/ML IJ SOLN: 12.5000 mg | INTRAMUSCULAR | Status: DC | PRN

## 2015-07-08 MED ORDER — KETOROLAC TROMETHAMINE 30 MG/ML IJ SOLN
INTRAMUSCULAR | Status: AC
  Administered 2015-07-08: 30 mg
  Filled 2015-07-08: qty 1

## 2015-07-08 MED ORDER — MEPERIDINE HCL 25 MG/ML IJ SOLN
6.2500 mg | INTRAMUSCULAR | Status: DC | PRN
Start: 2015-07-08 — End: 2015-07-08

## 2015-07-08 MED ORDER — PROMETHAZINE HCL 25 MG/ML IJ SOLN: 6.2500 mg | INTRAMUSCULAR | Status: DC | PRN

## 2015-07-08 MED ORDER — FENTANYL 2.5 MCG/ML BUPIVACAINE 1/10 % EPIDURAL INFUSION (WH - ANES): 14.0000 mL/h | INTRAMUSCULAR | Status: DC | PRN

## 2015-07-08 MED ORDER — LACTATED RINGERS IV SOLN: 2.5000 [IU]/h | INTRAVENOUS | Status: DC

## 2015-07-08 MED ORDER — DIPHENHYDRAMINE HCL 25 MG PO CAPS: 25.0000 mg | ORAL_CAPSULE | Freq: Four times a day (QID) | ORAL | Status: DC | PRN

## 2015-07-08 MED ORDER — PHENYLEPHRINE HCL 10 MG/ML IJ SOLN
INTRAMUSCULAR | Status: DC | PRN
  Administered 2015-07-08: 40 ug via INTRAVENOUS
  Administered 2015-07-08 (×2): 80 ug via INTRAVENOUS
  Administered 2015-07-08: 40 ug via INTRAVENOUS

## 2015-07-08 MED ORDER — MEPERIDINE HCL 25 MG/ML IJ SOLN
INTRAMUSCULAR | Status: AC
  Filled 2015-07-08: qty 1

## 2015-07-08 MED ORDER — SODIUM BICARBONATE 8.4 % IV SOLN
INTRAVENOUS | Status: DC | PRN
  Administered 2015-07-08 (×2): 5 mL via EPIDURAL

## 2015-07-08 MED ORDER — CITRIC ACID-SODIUM CITRATE 334-500 MG/5ML PO SOLN
30.0000 mL | ORAL | Status: DC | PRN
  Administered 2015-07-08: 30 mL via ORAL
  Filled 2015-07-08: qty 15

## 2015-07-08 MED ORDER — EPHEDRINE 5 MG/ML INJ: 10.0000 mg | INTRAVENOUS | Status: DC | PRN

## 2015-07-08 MED ORDER — PHENYLEPHRINE 40 MCG/ML (10ML) SYRINGE FOR IV PUSH (FOR BLOOD PRESSURE SUPPORT)
PREFILLED_SYRINGE | INTRAVENOUS | Status: AC
Start: 2015-07-08 — End: 2015-07-08
  Filled 2015-07-08: qty 10

## 2015-07-08 MED ORDER — MISOPROSTOL 25 MCG QUARTER TABLET: 25.0000 ug | ORAL_TABLET | ORAL | Status: DC | PRN

## 2015-07-08 MED ORDER — MORPHINE SULFATE (PF) 0.5 MG/ML IJ SOLN
INTRAMUSCULAR | Status: DC | PRN
  Administered 2015-07-08: 1 mg via INTRAVENOUS
  Administered 2015-07-08: 4 mg via EPIDURAL

## 2015-07-08 MED ORDER — CEFAZOLIN SODIUM-DEXTROSE 2-3 GM-% IV SOLR
INTRAVENOUS | Status: AC
Start: 2015-07-08 — End: 2015-07-08
  Filled 2015-07-08: qty 50

## 2015-07-08 MED ORDER — SIMETHICONE 80 MG PO CHEW
80.0000 mg | CHEWABLE_TABLET | ORAL | Status: DC | PRN
  Administered 2015-07-10: 80 mg via ORAL

## 2015-07-08 MED ORDER — SODIUM CHLORIDE 0.9 % IR SOLN
Status: DC | PRN
  Administered 2015-07-08: 1000 mL

## 2015-07-08 MED ORDER — SCOPOLAMINE 1 MG/3DAYS TD PT72
MEDICATED_PATCH | TRANSDERMAL | Status: DC | PRN
  Administered 2015-07-08: 1 via TRANSDERMAL

## 2015-07-08 MED ORDER — SIMETHICONE 80 MG PO CHEW
80.0000 mg | CHEWABLE_TABLET | ORAL | Status: DC
  Administered 2015-07-09 – 2015-07-10 (×3): 80 mg via ORAL
  Filled 2015-07-08 (×3): qty 1

## 2015-07-08 MED ORDER — LIDOCAINE-EPINEPHRINE (PF) 2 %-1:200000 IJ SOLN
INTRAMUSCULAR | Status: AC
  Filled 2015-07-08: qty 20

## 2015-07-08 MED ORDER — DIBUCAINE 1 % RE OINT: 1.0000 "application " | TOPICAL_OINTMENT | RECTAL | Status: DC | PRN

## 2015-07-08 MED ORDER — SENNOSIDES-DOCUSATE SODIUM 8.6-50 MG PO TABS
2.0000 | ORAL_TABLET | ORAL | Status: DC
Start: 2015-07-09 — End: 2015-07-11
  Administered 2015-07-09 – 2015-07-10 (×3): 2 via ORAL
  Filled 2015-07-08 (×3): qty 2

## 2015-07-08 MED ORDER — SODIUM CHLORIDE 0.9% FLUSH: 3.0000 mL | INTRAVENOUS | Status: DC | PRN

## 2015-07-08 MED ORDER — LACTATED RINGERS IV SOLN
INTRAVENOUS | Status: DC
  Administered 2015-07-09: 03:00:00 via INTRAVENOUS

## 2015-07-08 MED ORDER — MEPERIDINE HCL 25 MG/ML IJ SOLN
INTRAMUSCULAR | Status: DC | PRN
  Administered 2015-07-08 (×2): 12.5 mg via INTRAVENOUS

## 2015-07-08 MED ORDER — OXYTOCIN 10 UNIT/ML IJ SOLN
1.0000 m[IU]/min | INTRAVENOUS | Status: DC
  Administered 2015-07-08: 2 m[IU]/min via INTRAVENOUS
  Filled 2015-07-08: qty 4

## 2015-07-08 MED ORDER — DEXAMETHASONE SODIUM PHOSPHATE 10 MG/ML IJ SOLN
INTRAMUSCULAR | Status: DC | PRN
  Administered 2015-07-08: 4 mg via INTRAVENOUS

## 2015-07-08 MED ORDER — DEXAMETHASONE SODIUM PHOSPHATE 4 MG/ML IJ SOLN
INTRAMUSCULAR | Status: AC
  Filled 2015-07-08: qty 1

## 2015-07-08 MED ORDER — CEFAZOLIN SODIUM-DEXTROSE 2-3 GM-% IV SOLR
2.0000 g | Freq: Once | INTRAVENOUS | Status: AC
  Administered 2015-07-08: 2 g via INTRAVENOUS

## 2015-07-08 MED ORDER — DIPHENHYDRAMINE HCL 25 MG PO CAPS
25.0000 mg | ORAL_CAPSULE | ORAL | Status: DC | PRN
  Administered 2015-07-09: 25 mg via ORAL
  Filled 2015-07-08 (×2): qty 1

## 2015-07-08 MED ORDER — PANTOPRAZOLE SODIUM 20 MG PO TBEC
20.0000 mg | DELAYED_RELEASE_TABLET | Freq: Every day | ORAL | Status: DC
  Administered 2015-07-09 – 2015-07-11 (×3): 20 mg via ORAL
  Filled 2015-07-08 (×5): qty 1

## 2015-07-08 MED ORDER — ZOLPIDEM TARTRATE 5 MG PO TABS: 5.0000 mg | ORAL_TABLET | Freq: Every evening | ORAL | Status: DC | PRN

## 2015-07-08 MED ORDER — NALBUPHINE HCL 10 MG/ML IJ SOLN
5.0000 mg | INTRAMUSCULAR | Status: DC | PRN
Start: 2015-07-08 — End: 2015-07-11

## 2015-07-08 MED ORDER — NALOXONE HCL 0.4 MG/ML IJ SOLN: 0.4000 mg | INTRAMUSCULAR | Status: DC | PRN

## 2015-07-08 MED ORDER — OXYCODONE-ACETAMINOPHEN 5-325 MG PO TABS: 1.0000 | ORAL_TABLET | ORAL | Status: DC | PRN

## 2015-07-08 MED ORDER — LACTATED RINGERS IV SOLN: 500.0000 mL | Freq: Once | INTRAVENOUS | Status: DC

## 2015-07-08 MED ORDER — LACTATED RINGERS IV SOLN
500.0000 mL | INTRAVENOUS | Status: DC | PRN
  Administered 2015-07-08: 500 mL via INTRAVENOUS

## 2015-07-08 MED ORDER — ONDANSETRON HCL 4 MG/2ML IJ SOLN
INTRAMUSCULAR | Status: AC
  Filled 2015-07-08: qty 2

## 2015-07-08 MED ORDER — IBUPROFEN 600 MG PO TABS
600.0000 mg | ORAL_TABLET | Freq: Four times a day (QID) | ORAL | Status: DC
Start: 2015-07-09 — End: 2015-07-11
  Administered 2015-07-09 – 2015-07-11 (×11): 600 mg via ORAL
  Filled 2015-07-08 (×11): qty 1

## 2015-07-08 MED ORDER — LACTATED RINGERS IV SOLN
40.0000 [IU] | INTRAVENOUS | Status: DC | PRN
  Administered 2015-07-08: 40 [IU] via INTRAVENOUS

## 2015-07-08 MED ORDER — PHENYLEPHRINE 40 MCG/ML (10ML) SYRINGE FOR IV PUSH (FOR BLOOD PRESSURE SUPPORT)
80.0000 ug | PREFILLED_SYRINGE | INTRAVENOUS | Status: DC | PRN
  Filled 2015-07-08: qty 20

## 2015-07-08 MED ORDER — OXYTOCIN BOLUS FROM INFUSION: 500.0000 mL | INTRAVENOUS | Status: DC

## 2015-07-08 MED ORDER — FLEET ENEMA 7-19 GM/118ML RE ENEM: 1.0000 | ENEMA | RECTAL | Status: DC | PRN

## 2015-07-08 SURGICAL SUPPLY — 34 items
CLAMP CORD UMBIL (MISCELLANEOUS) IMPLANT
CLOSURE WOUND 1/2 X4 (GAUZE/BANDAGES/DRESSINGS)
CLOTH BEACON ORANGE TIMEOUT ST (SAFETY) ×3 IMPLANT
CONTAINER PREFILL 10% NBF 15ML (MISCELLANEOUS) IMPLANT
DRSG OPSITE POSTOP 4X10 (GAUZE/BANDAGES/DRESSINGS) ×3 IMPLANT
DURAPREP 26ML APPLICATOR (WOUND CARE) ×3 IMPLANT
ELECT REM PT RETURN 9FT ADLT (ELECTROSURGICAL) ×3
ELECTRODE REM PT RTRN 9FT ADLT (ELECTROSURGICAL) ×1 IMPLANT
EXTRACTOR VACUUM M CUP 4 TUBE (SUCTIONS) IMPLANT
EXTRACTOR VACUUM M CUP 4' TUBE (SUCTIONS)
GLOVE BIO SURGEON STRL SZ 6.5 (GLOVE) ×2 IMPLANT
GLOVE BIO SURGEONS STRL SZ 6.5 (GLOVE) ×1
GLOVE BIOGEL PI IND STRL 7.0 (GLOVE) ×2 IMPLANT
GLOVE BIOGEL PI INDICATOR 7.0 (GLOVE) ×4
GOWN STRL REUS W/TWL LRG LVL3 (GOWN DISPOSABLE) ×6 IMPLANT
KIT ABG SYR 3ML LUER SLIP (SYRINGE) IMPLANT
NEEDLE HYPO 22GX1.5 SAFETY (NEEDLE) IMPLANT
NEEDLE HYPO 25X5/8 SAFETYGLIDE (NEEDLE) IMPLANT
NS IRRIG 1000ML POUR BTL (IV SOLUTION) ×3 IMPLANT
PACK C SECTION WH (CUSTOM PROCEDURE TRAY) ×3 IMPLANT
PAD OB MATERNITY 4.3X12.25 (PERSONAL CARE ITEMS) ×3 IMPLANT
PENCIL SMOKE EVAC W/HOLSTER (ELECTROSURGICAL) ×3 IMPLANT
STRIP CLOSURE SKIN 1/2X4 (GAUZE/BANDAGES/DRESSINGS) IMPLANT
SUT MON AB 4-0 PS1 27 (SUTURE) ×3 IMPLANT
SUT PLAIN 0 NONE (SUTURE) IMPLANT
SUT PLAIN 2 0 XLH (SUTURE) ×3 IMPLANT
SUT VIC AB 0 CT1 36 (SUTURE) ×9 IMPLANT
SUT VIC AB 0 CTX 36 (SUTURE) ×4
SUT VIC AB 0 CTX36XBRD ANBCTRL (SUTURE) ×2 IMPLANT
SUT VIC AB 2-0 CT1 27 (SUTURE) ×2
SUT VIC AB 2-0 CT1 TAPERPNT 27 (SUTURE) ×1 IMPLANT
SYR CONTROL 10ML LL (SYRINGE) IMPLANT
TOWEL OR 17X24 6PK STRL BLUE (TOWEL DISPOSABLE) ×3 IMPLANT
TRAY FOLEY CATH SILVER 14FR (SET/KITS/TRAYS/PACK) IMPLANT

## 2015-07-08 NOTE — Progress Notes (Signed)
Chaplain visit the result of a Spiritual Consult placed in Kelsey [name]'s chart. Kelsey [name] requested a consult to talk to someone upon admission. She requested and received a blessing. She and Husband, Kelsey [name], are looking forward to son Kelsey [name] being born, he is late in coming. The couple expressed their plans for his bright future and their hopes for his being a part of their faith community.  Follow up spiritual care - Please page the chaplain if Kelsey [name] or her husband requests or needs spiritual or emotional care.  Benjie Karvonen. Zilda No, DMIn, MDiv Chaplain

## 2015-07-08 NOTE — H&P (Signed)
Kelsey [name] is a 35 y.o. G1P0 at [redacted]w[redacted]d presenting for elective IOL. Pt notes mild contractions over the past hour since pitocin started at 130am . Good fetal movement, No vaginal bleeding, not leaking fluid.  [facility] at [facility] since 6 wks - Dated by 6 wk u/s - GERD, on Protonix - low lying placenta, moved to posterior - Limited LVOT images - Significant MSK pain from pregnancy, pt has needed assistance with ADL and ambulates in wheelchair over the last several weeks. - h/o depression, HA, chronic pain, post-concussive syndrome- on multiple meds prior to preg, remained on amitriptylene alone through pregnancy - fetal growth- 37 wks- 6'12, 54%  Prenatal Transfer Tool  Maternal Diabetes: No Genetic Screening: Normal Maternal Ultrasounds/Referrals: Normal Fetal Ultrasounds or other Referrals:  None Maternal Substance Abuse:  No Significant Maternal Medications:  None Significant Maternal Lab Results: None     OB History    Gravida Para Term Preterm AB TAB SAB Ectopic Multiple Living   1              Past Medical History  Diagnosis Date  . Asthma   . Depression   . GERD (gastroesophageal reflux disease)   . Frequent headaches   . Migraines   . IBS (irritable bowel syndrome)   . Panic attacks   . Vertigo 12/31/2013  . Anxiety and depression   . Hx of varicella   . Endometriosis    Past Surgical History  Procedure Laterality Date  . No past surgeries     Family History: family history includes Cancer in her maternal grandmother and mother; Hypertension in her father and mother; Stroke in her father. Social History:  reports that she has never smoked. She has never used smokeless tobacco. She reports that she does not drink alcohol or use illicit drugs.  Review of Systems - Negative except MSK pain   Dilation: 4 Effacement (%): 70 Station: -2, -3 Exam by:: M.Merrill, RN Blood pressure 111/72, pulse 73, temperature 98.4 F (36.9 C), temperature source Oral,  resp. rate 16, height  (1.651 m), weight 81.647 kg (180 lb), last menstrual period 09/24/2014.  Physical Exam:  Gen: well appearing, no distress, comfortable reading in bed  Abd: gravid, NT, no RUQ pain EFW 8# LE: 3+ edema L leg, no warmth, no cords, no pain, 2+ edema R leg.  Toco: q3,min on pitocin FH: baseline 140, accelerations present, no deceleratons, 10 beat variability  Prenatal labs: ABO, Rh: --/--/B POS, B POS (02/18 0055) Antibody: NEG (02/18 0055) Rubella: !Error! RPR: Nonreactive (07/06 0000)  HBsAg: Negative (07/06 0000)  HIV: Non-reactive (07/06 0000)  GBS: Negative (02/03 0000)  1 hr Glucola normal  Genetic screening normal quad Anatomy US normal   Assessment/Plan: 35 y.o. G1P0 at [redacted]w[redacted]d IOL for post-dates. Pitocin due to cervical dilation at admission. Continue to increase pitocin to achieve palpable and uncomfortable contractions and cervical change.  GBS neg AGA Planning epidural   Barbie Croston A. 07/08/2015, 6:23 AM

## 2015-07-08 NOTE — Brief Op Note (Signed)
07/08/2015  4:41 PM  PATIENT:  Kelsey [name]  35 y.o. female  PRE-OPERATIVE DIAGNOSIS:  cesarean section primary, non-reassuring fetal heart tones   POST-OPERATIVE DIAGNOSIS:  cesarean section primary  PROCEDURE:  Procedure(s): CESAREAN SECTION (N/A), 2 layer closure  SURGEON:  Surgeon(s) and Role:    * Noland Fordyce, MD - Primary  PHYSICIAN ASSISTANT:   ASSISTANTSArita Miss, CNM   ANESTHESIA:   epidural  EBL:  Total I/O In: 2000 [I.V.:2000] Out: 1700 [Urine:1000; Blood:700]  BLOOD ADMINISTERED:none  DRAINS: Urinary Catheter (Foley)   LOCAL MEDICATIONS USED:  NONE  SPECIMEN:  none  DISPOSITION OF SPECIMEN: n/a  COUNTS:  YES  TOURNIQUET:  * No tourniquets in log *  DICTATION: .Note written in EPIC  PLAN OF CARE: Admit to inpatient   PATIENT DISPOSITION:  PACU - hemodynamically stable.   Delay start of Pharmacological VTE agent (>24hrs) due to surgical blood loss or risk of bleeding: yes

## 2015-07-08 NOTE — Transfer of Care (Signed)
Immediate Anesthesia Transfer of Care Note  Patient: Kelsey [name]  Procedure(s) Performed: Procedure(s): CESAREAN SECTION (N/A)  Patient Location: PACU  Anesthesia Type:Epidural  Level of Consciousness: awake, alert  and oriented  Airway & Oxygen Therapy: Patient Spontanous Breathing and Patient connected to nasal cannula oxygen  Post-op Assessment: Report given to RN and Post -op Vital signs reviewed and stable  Post vital signs: Reviewed and stable  Last Vitals:  Filed Vitals:   07/08/15 1515 07/08/15 1516  BP: 118/71   Pulse: 115 115  Temp:    Resp:      Complications: No apparent anesthesia complications

## 2015-07-08 NOTE — Progress Notes (Signed)
S: Doing well, no complaints, pain well controlled with epidural  O: BP 124/72 mmHg  Pulse 70  Temp(Src) 98.2 F (36.8 C) (Oral)  Resp 18  Ht  (1.651 m)  Wt 81.647 kg (180 lb)  BMI 29.95 kg/m2  SpO2 100%  LMP 09/24/2014   FHT:  FHR: 120s bpm, variability: moderate,  accelerations:  Present,  decelerations:  Present severl deep variables followed by 5-8 minutes of no fetal tracing followed by 10 min decel to 80's. With position change, IVF bolus, stopping pitocin, FH recovered, now ctx q 8, minimal by IUPC UC:   Q47min, to q8 with d/c of pitocin  SVE:   Dilation: 4 Effacement (%): 50 Station: -3 Exam by:: dr Raye Sorrow No change since my last xam at 10am  A / P:  35 y.o.  Obstetric History   G1   P0   T0   P0   A0   TAB0   SAB0   E0   M0   L0    at [redacted]w[redacted]d Latent phase labor, non-reassuring fetal heart tracing  Fetal Wellbeing:  Category II and long decels with minimal MVU and lack of cervical change concerning. Post-dates IOL and likely placental insufficiency. Recc PCS Pain Control:  Epidural  Anticipated MOD:  PCS for non-reasurring fetal heart traacing.   Kelsey [name] A. 07/08/2015, 2:31 PM

## 2015-07-08 NOTE — Anesthesia Preprocedure Evaluation (Signed)
Anesthesia Evaluation  Patient identified by MRN, date of birth, ID band Patient awake    Reviewed: Allergy & Precautions, NPO status , Patient's Chart, lab work & pertinent test results  Airway Mallampati: II  TM Distance: >3 FB Neck ROM: Full    Dental no notable dental hx.    Pulmonary asthma ,    Pulmonary exam normal breath sounds clear to auscultation       Cardiovascular negative cardio ROS Normal cardiovascular exam Rhythm:Regular Rate:Normal     Neuro/Psych  Headaches, PSYCHIATRIC DISORDERS Anxiety Depression negative neurological ROS     GI/Hepatic Neg liver ROS, GERD  ,  Endo/Other  negative endocrine ROS  Renal/GU negative Renal ROS     Musculoskeletal negative musculoskeletal ROS (+)   Abdominal   Peds  Hematology negative hematology ROS (+)   Anesthesia Other Findings   Reproductive/Obstetrics (+) Pregnancy                             Anesthesia Physical Anesthesia Plan  ASA: II  Anesthesia Plan: Epidural   Post-op Pain Management:    Induction:   Airway Management Planned:   Additional Equipment:   Intra-op Plan:   Post-operative Plan:   Informed Consent: I have reviewed the patients History and Physical, chart, labs and discussed the procedure including the risks, benefits and alternatives for the proposed anesthesia with the patient or authorized representative who has indicated his/her understanding and acceptance.     Plan Discussed with:   Anesthesia Plan Comments:         Anesthesia Quick Evaluation

## 2015-07-08 NOTE — Anesthesia Procedure Notes (Signed)
Epidural Patient location during procedure: OB  Staffing Anesthesiologist: Latosha Gaylord Performed by: anesthesiologist   Preanesthetic Checklist Completed: patient identified, pre-op evaluation, timeout performed, IV checked, risks and benefits discussed and monitors and equipment checked  Epidural Patient position: sitting Prep: site prepped and draped and DuraPrep Patient monitoring: heart rate Approach: midline Location: L2-L3 Injection technique: LOR air and LOR saline  Needle:  Needle type: Tuohy  Needle gauge: 17 G Needle length: 9 cm Needle insertion depth: 5 cm Catheter type: closed end flexible Catheter size: 19 Gauge Catheter at skin depth: 11 cm Test dose: negative  Assessment Sensory level: T8 Events: blood not aspirated, injection not painful, no injection resistance, negative IV test and no paresthesia  Additional Notes Reason for block:procedure for pain   

## 2015-07-08 NOTE — Op Note (Signed)
07/08/2015  4:41 PM  PATIENT:  Kelsey Kelsey [name]  35 y.o. female  PRE-OPERATIVE DIAGNOSIS:  cesarean section primary, non-reassuring fetal heart tones   POST-OPERATIVE DIAGNOSIS:  cesarean section primary  PROCEDURE:  Procedure(s): CESAREAN SECTION (N/Kelsey [name]), 2 layer closure  SURGEON:  Surgeon(s) and Role:    * Noland Fordyce, MD - Primary  PHYSICIAN ASSISTANT:   ASSISTANTSArita Miss, CNM   ANESTHESIA:   epidural  EBL:  Total I/O In: 2000 [I.V.:2000] Out: 1700 [Urine:1000; Blood:700]  BLOOD ADMINISTERED:none  DRAINS: Urinary Catheter (Foley)   LOCAL MEDICATIONS USED:  NONE  SPECIMEN:  none  DISPOSITION OF SPECIMEN: n/Kelsey [name]  COUNTS:  YES  TOURNIQUET:  * No tourniquets in log *  DICTATION: .Note written in EPIC  PLAN OF CARE: Admit to inpatient   PATIENT DISPOSITION:  PACU - hemodynamically stable.   Delay start of Pharmacological VTE agent (>24hrs) due to surgical blood loss or risk of bleeding: yes   Findings:  @ infant,  APGAR (1 MIN):   APGAR (5 MINS):   APGAR (10 MINS):   Normal uterus, tubes and ovaries, normal placenta. 3VC, meconium stained amniotic fluid  EBL: 700 cc Antibiotics:   2g Ancef Complications: none  Indications: This is Kelsey [name] 35 y.o. year-old, G1  At [redacted]w[redacted]d admitted for post-dated IOL, Slow progress and low MVU despite high doses pitocin and never with fetal descent. 12 min fetal bradycardia once pt started in active labor, recovered with turning off pitocin but given remote from delivery and risks of recurrent bradycardia, decision for PCS. Risks benefits and alternatives of the procedure were discussed with the patient who agreed to proceed  Procedure:  After informed consent was obtained the patient was taken to the operating room where epidural anesthesia was found to be adequate.  She was prepped and draped in the normal sterile fashion in dorsal supine position with Kelsey [name] leftward tilt.  Kelsey [name] foley catheter was in place.  Kelsey [name] Pfannenstiel skin  incision was made 2 cm above the pubic symphysis in the midline with the scalpel.  Dissection was carried down with the Bovie cautery until the fascia was reached. The fascia was incised in the midline. The incision was extended laterally with the Mayo scissors. The inferior aspect of the fascial incision was grasped with the Coker clamps, elevated up and the underlying rectus muscles were dissected off sharply. The superior aspect of the fascial incision was grasped with the Coker clamps elevated up and the underlying rectus muscles were dissected off sharply.  The peritoneum was entered bluntly. The peritoneal incision was extended superiorly and inferiorly with good visualization of the bladder. The bladder blade was inserted and palpation was done to assess the fetal position and the location of the uterine vessels. The lower segment of the uterus was incised sharply with the scalpel and extended  bluntly in the cephalo-caudal fashion. The infant was grasped, brought to the incision,  rotated and the infant was delivered with fundal pressure. The nose and mouth were bulb suctioned. The cord was clamped and cut. The infant was handed off to the waiting pediatrician. The placenta was expressed. The uterus was exteriorized. The uterus was cleared of all clots and debris. The uterine incision was repaired with 0 Vicryl in Kelsey [name] running locked fashion.  Kelsey [name] second layer of the same suture was used in an imbricating fashion to obtain excellent hemostasis.  The uterus was then returned to the abdomen, the gutters were cleared of all clots and debris. The uterine incision was reinspected  and found to be hemostatic. The peritoneum was grasped and closed with 2-0 Vicryl in Kelsey [name] running fashion. The cut muscle edges and the underside of the fascia were inspected and found to be hemostatic. The fascia was closed with 0 Vicryl in two halves . The subcutaneous tissue was irrigated. Scarpa's layer was closed with Kelsey [name] 2-0 plain gut  suture. The skin was closed with Kelsey [name] 4-0 Monocryl in Kelsey [name] single layer. The patient tolerated the procedure well. Sponge lap and needle counts were correct x3 and patient was taken to the recovery room in Kelsey [name] stable condition.  Kelsey Kelsey [name]. 07/08/2015 4:43 PM

## 2015-07-08 NOTE — Progress Notes (Signed)
S: Doing well, no complaints, pain well controlled planning epidural when contractions uncomfortable  O: BP 95/53 mmHg  Pulse 85  Temp(Src) 98.4 F (36.9 C) (Oral)  Resp 18  Ht  (1.651 m)  Wt 81.647 kg (180 lb)  BMI 29.95 kg/m2  LMP 09/24/2014   FHT:  FHR: 140s bpm, variability: moderate,  accelerations:  Present,  decelerations:  Absent UC:   regular, every 3 minutes SVE:   Dilation: 4 Effacement (%): 50 Station: -3 Exam by:: dr Raye Sorrow  AROM clear, IUPC placed  A / P:  35 y.o.  Obstetric History   G1   P0   T0   P0   A0   TAB0   SAB0   E0   M0   L0    at [redacted]w[redacted]d post-dates IOL, slow progress, not yet uncomfortable with contractions. AROM and IUPC, continue to increase pitocin  Fetal Wellbeing:  Category I Pain Control:  Labor support without medications  Anticipated MOD:  NSVD  Kelsey [name] A. 07/08/2015, 10:44 AM

## 2015-07-09 LAB — CBC
HCT: 31.6 % — ABNORMAL LOW (ref 36.0–46.0)
Hemoglobin: 10.7 g/dL — ABNORMAL LOW (ref 12.0–15.0)
MCH: 30.1 pg (ref 26.0–34.0)
MCHC: 33.9 g/dL (ref 30.0–36.0)
MCV: 88.8 fL (ref 78.0–100.0)
PLATELETS: 166 10*3/uL (ref 150–400)
RBC: 3.56 MIL/uL — ABNORMAL LOW (ref 3.87–5.11)
RDW: 14 % (ref 11.5–15.5)
WBC: 11.1 10*3/uL — ABNORMAL HIGH (ref 4.0–10.5)

## 2015-07-09 MED ORDER — OXYCODONE-ACETAMINOPHEN 5-325 MG PO TABS
1.0000 | ORAL_TABLET | ORAL | Status: DC | PRN
  Administered 2015-07-09: 1 via ORAL
  Administered 2015-07-09 – 2015-07-11 (×4): 2 via ORAL
  Filled 2015-07-09: qty 2
  Filled 2015-07-09: qty 1
  Filled 2015-07-09 (×4): qty 2

## 2015-07-09 MED ORDER — AMITRIPTYLINE HCL 25 MG PO TABS
12.5000 mg | ORAL_TABLET | Freq: Every day | ORAL | Status: DC
  Administered 2015-07-09 – 2015-07-10 (×2): 12.5 mg via ORAL
  Filled 2015-07-09 (×3): qty 0.5

## 2015-07-09 NOTE — Clinical Social Work Maternal (Signed)
  CLINICAL SOCIAL WORK MATERNAL/CHILD NOTE  Patient Details  Name: Kelsey [name] MRN: [id number] Date of Birth: 12-Jun-1980  Date:  07/09/2015  Clinical Social Worker Initiating Note:  Norlene Duel, LCSW Date/ Time Initiated:  07/09/15/1541     Child's Name:  Kelsey [name]   Legal Guardian:   (Parents Harrell Gave and [name])   Need for Interpreter:  None   Date of Referral:  07/08/15     Reason for Referral:  Other (Comment)   Referral Source:  [employer]   Address:  [address]  [place], [place] [address]  Phone number:   [phone])   Household Members:  Spouse   Natural Supports (not living in the home):  Immediate Family, Extended Family   Professional Supports: None   Employment:  (Both parents employed)   Type of Work:     Education:      Pensions consultant:  Multimedia programmer   Other Resources:      Cultural/Religious Considerations Which May Impact Care:  none noted  Strengths:  Ability to meet basic needs , Home prepared for child    Risk Factors/Current Problems:      Cognitive State:  Alert , Able to Concentrate    Mood/Affect:  Happy    CSW Assessment:  Acknowledged order for social work consult to assess mother's hx of Depression and anxiety. Met with mother who was pleasant and receptive to social work. Spouse was also present and very attentive. [name] reports hx of anxiety and depression. Informed that she is currently being treated with Amitriptyline.  She denies any current symptoms of depression or anxiety. She denies any use of alcohol or illicit drug use during pregnancy. Mother communicate excitement about newborn.   Spoke at length with both parents regarding PP Depression.   No acute social concerns noted or reported at this time.  Affect and behavior was appropriate during the entire visit. Mother informed of social work Fish farm manager.   CSW Plan/Description:     Discussed signs/symptoms of PP Depression and available  resources  No further intervention required  No barriers to discharge    Brier Firebaugh J, LCSW 07/09/2015, 3:44 PM

## 2015-07-09 NOTE — Anesthesia Postprocedure Evaluation (Signed)
Anesthesia Post Note  Patient: Kelsey [name]  Procedure(s) Performed: Procedure(s) (LRB): CESAREAN SECTION (N/A)  Patient location during evaluation: Mother Baby Anesthesia Type: Epidural Level of consciousness: awake and alert Pain management: pain level controlled Vital Signs Assessment: post-procedure vital signs reviewed and stable Respiratory status: spontaneous breathing, nonlabored ventilation and respiratory function stable Cardiovascular status: stable Postop Assessment: no headache, no backache, epidural receding, no signs of nausea or vomiting and adequate PO intake Anesthetic complications: no    Last Vitals:  Filed Vitals:   07/09/15 0133 07/09/15 0533  BP: 103/64 102/49  Pulse: 59 68  Temp: 36.8 C 36.9 C  Resp: 20 20    Last Pain:  Filed Vitals:   07/09/15 0534  PainSc: 0-No pain                 Haskell Rihn

## 2015-07-09 NOTE — Lactation Note (Signed)
This note was copied from a baby's chart. Lactation Consultation Note Follow up visit at 30 hours of age.  Mom reports baby feeding on and off last hour.  Mom reports pain with left breast during pregnancy and unable to tolerate pumping, latching and even using NS.  Baby latched well with right breast in football hold.  Baby has strong rhythmic sucking for about 10 minutes and then layed close to mom quiet alert.  Attempted to latch in cross cradle  With NS and mom continues to report pain.  Removed NS and mom still reports pain.  encouraged mom to latch if she can and pump with hand pump (still reports pain). Discussed basics of breastfeeding, burping, comforting baby and feeding with early feeding cues.  Encouraged mom to work on hand expression for spoon feeding and discussed cluster feeding.  Report given to [facility] RN.   Patient Name: Kelsey [name] [id number] Date: 07/09/2015 Reason for consult: Follow-up assessment   Maternal Data    Feeding Feeding Type: Breast Fed Length of feed:  (15)  LATCH Score/Interventions Latch: Repeated attempts needed to sustain latch, nipple held in mouth throughout feeding, stimulation needed to elicit sucking reflex. Intervention(s): Adjust position;Assist with latch;Breast massage;Breast compression  Audible Swallowing: None Intervention(s): Skin to skin;Hand expression  Type of Nipple: Everted at rest and after stimulation  Comfort (Breast/Nipple): Soft / non-tender (severe discomfort on left nipple with latch)     Hold (Positioning): Assistance needed to correctly position infant at breast and maintain latch. Intervention(s): Breastfeeding basics reviewed;Support Pillows;Position options;Skin to skin  LATCH Score: 6  Lactation Tools Discussed/Used Nipple shield size: 24 (on and off on left breast only)   Consult Status Consult Status: Follow-up Date: 07/10/15 Follow-up type: In-patient    Jannifer Rodney 07/09/2015, 10:44 PM

## 2015-07-09 NOTE — Lactation Note (Signed)
This note was copied from a baby's chart. Lactation Consultation Note  P1, 18 hours old.  Mother has been breastfeeding but has severe discomfort with L nipple, some discomfort w/ R nipple. Reviewed hand expression.  Oral assessment indicated strong suck. Baby latched with assistance on L side in football hold and after a few minutes baby had slipped down toward nipple tip. When unlatched, noted compressed nipple with pink tip.  Encouraged keeping baby deep on breast. Helped mother re-latch deeper and taught FOB how to perform chin tug. Attempted latching on R nipple but mother winced in pain.  Fitted mother with #27 & #30 flanges and suggest she stimulate breast with hand pump. Parents asked for a NS.   Fitted mother with a #20NS on L nipple and mother thought it may have relieved pain a little. Made patient aware that if she decides  to continue using NS she will have start post pumping with DEBP and let RN know also. Provided parents with comfort gels and shells to wear and encouraged applying ebm. Mom encouraged to feed baby 8-12 times/24 hours and with feeding cues.  Mom made aware of O/P services, breastfeeding support groups, community resources, and our phone # for post-discharge questions.              Patient Name: Kelsey [name] [id number] Date: 07/09/2015 Reason for consult: Initial assessment   Maternal Data    Feeding Feeding Type: Breast Fed Length of feed: 45 min  LATCH Score/Interventions Latch: Grasps breast easily, tongue down, lips flanged, rhythmical sucking.  Audible Swallowing: A few with stimulation  Type of Nipple: Everted at rest and after stimulation  Comfort (Breast/Nipple): Engorged, cracked, bleeding, large blisters, severe discomfort  Problem noted: Severe discomfort Interventions (Severe discomfort): Flange size  Hold (Positioning): Assistance needed to correctly position infant at breast and maintain latch.  LATCH Score:  6  Lactation Tools Discussed/Used Tools: Shells;Nipple Shields;Comfort gels;Flanges Nipple shield size: 24   Consult Status Consult Status: Follow-up Date: 07/10/15 Follow-up type: In-patient    Dahlia Byes [facility] 07/09/2015, 11:17 AM

## 2015-07-09 NOTE — Progress Notes (Signed)
POD# 1  S: Pt notes pain controlled w/ po meds but notes abdominal distension, pain with movement and nipple pain with breastfeeding. Minimal lochia, working with lactation, still with foley, only out of bed once since c/s, tolerated regular food, + flatus. No chest pain.   Filed Vitals:   07/08/15 2155 07/09/15 0133 07/09/15 0533 07/09/15 0947  BP: 113/68 103/64 102/49 110/60  Pulse: 87 59 68 71  Temp:  98.3 F (36.8 C) 98.4 F (36.9 C) 98.4 F (36.9 C)  TempSrc:  Oral Oral Oral  Resp: Height:      Weight:      SpO2: 97% 96% 98% 98%    Gen: well appearing, no distress CV: RRR Pulm: CTAB Abd: soft distension with tympany,  approp tender, fundus below umbilicus, NT, umbilical hernia noted- reducible Inc: C/D/I, dressed LE: 2+ edema, NT Gu: appropriate lochia  CBC    Component Value Date/Time   WBC 11.1* 07/09/2015 0535   RBC 3.56* 07/09/2015 0535   HGB 10.7* 07/09/2015 0535   HCT 31.6* 07/09/2015 0535   PLT 166 07/09/2015 0535   MCV 88.8 07/09/2015 0535   MCH 30.1 07/09/2015 0535   MCHC 33.9 07/09/2015 0535   RDW 14.0 07/09/2015 0535   LYMPHSABS 1.8 03/22/2014 1441   MONOABS 0.3 03/22/2014 1441   EOSABS 0.1 03/22/2014 1441   BASOSABS 0.0 03/22/2014 1441    A/P: POD#  1 s/p PCS for non-reasurring fetal testing - post-op. Doing well. Advance diet, remove foley, encourage ambulation - nipple pain- breast shields, work with lacation - chronic pain- resume amytriptylene.   Anikka Marsan A. 07/09/2015 11:55 AM

## 2015-07-10 ENCOUNTER — Encounter (HOSPITAL_COMMUNITY): Payer: Self-pay | Admitting: *Deleted

## 2015-07-10 NOTE — Lactation Note (Signed)
This note was copied from a baby's chart. Lactation Consultation Note  Patient Name: Kelsey [name] [id number] Date: 07/10/2015 Reason for consult: Follow-up assessment;Other (Comment) (supplement per LC due to age 33 hours and sore nipples , questiioning let down )  MBU RN called LC to assess latch on the left breast after MBU RN had assisted with latch on the right breast for 30 mins. [facility] RN reports swallows,  Also with some discomfort with latch and eased off). Latching with assistance on the left breast, 1st breast massage , hand express , even though the baby  opened wide and depth achieved mom still felt intermittent discomfort and baby ended up feeding 20 mins with swallows.  LC suspects due to mom sensitivity of nipples all during the pregnancy, and since delivery with and without latching LC and latching plan needs to take  Potential decreased of  let down into consideration.   Lactation Plan:  Steps for latching - 1st breast , breast massage, hand express, pre- pump with hand pump, latch with breast compressions, and make sure when latching  Baby's upper lip is tickled until baby opens wide and so high palate can be accommodates with moms tissue. Also firm support. After the baby is latched insert 20F feeding tube in the corner of the mouth 1 1/4 inch until there is a tug and baby should pull the milk down when baby suckles. May need to pace feed with latch.  Goal - is to get the baby in a consistent feeding pattern at the breast when latch to decrease the discomfort  LC gave mom the option to feed on the 1st breast with the supplement ( right breast - not as sore ) ,  Post pump both breast for 15 -20 mins - save milk and use for the next feeding. ( using #30 flanges )  LC recommended to mom to use a dab of coconut oil to nipples and areolas prior to pumping to decrease friction while pumping and pain.  Dad plans to go buy coconut oil this afternoon.  MBU RN aware of LC plan.       Maternal Data    Feeding Feeding Type: Formula Length of feed: 20 min (swallows noted )  LATCH Score/Interventions Latch: Grasps breast easily, tongue down, lips flanged, rhythmical sucking. Intervention(s): Breast massage;Breast compression;Assist with latch  Audible Swallowing: A few with stimulation Intervention(s): Skin to skin Intervention(s): Alternate breast massage  Type of Nipple: Everted at rest and after stimulation  Comfort (Breast/Nipple): Filling, red/small blisters or bruises, mild/mod discomfort  Problem noted: Mild/Moderate discomfort Interventions (Mild/moderate discomfort): Hand expression  Hold (Positioning): Assistance needed to correctly position infant at breast and maintain latch. Intervention(s): Breastfeeding basics reviewed  LATCH Score: 7  Lactation Tools Discussed/Used Tools: Pump;20F feeding tube / Syringe;Comfort gels;Flanges;Shells Flange Size: 30 Shell Type: Inverted Breast pump type: Double-Electric Breast Pump (MBU RN set up DEBP and LC reviewed , and watched mom pump ) Pump Review: Setup, frequency, and cleaning Initiated by:: LC MAI reviewed    Consult Status Consult Status: Follow-up Date: 07/11/15 Follow-up type: In-patient    Kelsey [name] 07/10/2015, 1:28 PM

## 2015-07-10 NOTE — Lactation Note (Addendum)
This note was copied from a baby's chart. Lactation Consultation Note New mom has sore nipples bilaterally. Mom stated they were sore in pregnancy. Breast soft and pendulum shaped with nipple at the bottom end of breast. Mom states that baby has cluster fed all day and she needs her breast to rest. FOB is formula feeding baby w/curve tip syring and finger. Baby extremely fussy acting very hungry. I burped baby, gave 10ml formula in syring w/gloved finger. Burped baby,and swaddled tightly. Baby wide awake. Asked parents to call [facility] for next feeding to assist in latching. Discussed burping, consoling, newborn behavior, I&O, cluster feeding, supply and demand. Comfort gels given. Hand expressed 2ml thick colostrum. Fob will given for next feeding. Colostrum time for storage in room reviewed. Patient Name: Kelsey [name] ZOXWR'U Date: 07/10/2015 Reason for consult: Follow-up assessment   Maternal Data Has patient been taught Hand Expression?: Yes Does the patient have breastfeeding experience prior to this delivery?: No  Feeding Feeding Type: Formula  LATCH Score/Interventions       Type of Nipple: Everted at rest and after stimulation  Comfort (Breast/Nipple): Filling, red/small blisters or bruises, mild/mod discomfort  Problem noted: Mild/Moderate discomfort Interventions (Mild/moderate discomfort): Comfort gels;Hand massage;Hand expression  Intervention(s): Breastfeeding basics reviewed;Support Pillows;Position options;Skin to skin     Lactation Tools Discussed/Used Tools: Comfort gels Nipple shield size: 24   Consult Status Consult Status: Follow-up Date: 07/10/15 Follow-up type: In-patient    Atticus Lemberger, Diamond Nickel 07/10/2015, 2:22 AM

## 2015-07-10 NOTE — Progress Notes (Addendum)
POD # 2  Subjective: Pt reports feeling well/ Pain controlled with Motrin and Percocet Tolerating po/Voiding without problems/ No n/v/ Flatus present/ feels bloated Activity: ad lib Bleeding is light Newborn info:  Information for the patient's newborn:  Meleena, Munroe [[id number]]  female   Circumcision: planning outpt/ Feeding: breast-trouble with latch  Objective: VS:  Filed Vitals:   07/09/15 1450 07/09/15 1802 07/10/15 0555 07/10/15 0700  BP: 102/61 104/55 108/69 117/64  Pulse: 70 69 68 90  Temp: 98.7 F (37.1 C) 98.3 F (36.8 C) 98.5 F (36.9 C) 98.8 F (37.1 C)  TempSrc: Oral Oral Oral Oral  Resp: Height:      Weight:      SpO2: 97% 99%  98%    I&O: Intake/Output      02/19 0701 - 02/20 0700 02/20 0701 - 02/21 0700   P.O. 1200    I.V. (mL/kg)     Total Intake(mL/kg) 1200 (14.7)    Urine (mL/kg/hr) 2100 (1.1)    Blood     Total Output 2100     Net -900            LABS:  Recent Labs  07/08/15 0055 07/09/15 0535  WBC 6.2 11.1*  HGB 13.5 10.7*  HCT 38.7 31.6*  PLT 195 166    Blood type: --/--/B POS, B POS (02/18 0055) Rubella: Immune (07/06 0000)    Physical Exam:  General: alert, cooperative and no distress CV: Regular rate and rhythm Resp: CTA bilaterally Abdomen: soft, nontender, normal bowel sounds Uterine Fundus: firm, below umbilicus, nontender Incision: Covered with Tegaderm and honeycomb dressing; no significant drainage, edema, bruising, or erythema; well approximated with suture Lochia: minimal Ext: edema 2+ LE on Lt and Homans sign is negative, no sign of DVT, 1+ LE edema on Rt   Assessment/: POD # 2/ G1P1001/ S/P C/Section d/t NRFHR  Dependent edema Doing well  Plan: Elevate LE, increase water intake Consider diuretic Warm liquids to facilitate flatus Ambulate in halls Continue routine post op orders Anticipate discharge home tomorrow   Signed: Donette Larry, Dorris Carnes, MSN, CNM 07/10/2015, 10:34 AM

## 2015-07-11 LAB — CBC WITH DIFFERENTIAL/PLATELET
Basophils Absolute: 0 10*3/uL (ref 0.0–0.1)
Basophils Relative: 0 %
EOS ABS: 0.1 10*3/uL (ref 0.0–0.7)
Eosinophils Relative: 2 %
HEMATOCRIT: 30.1 % — AB (ref 36.0–46.0)
HEMOGLOBIN: 10.1 g/dL — AB (ref 12.0–15.0)
LYMPHS ABS: 1.4 10*3/uL (ref 0.7–4.0)
LYMPHS PCT: 18 %
MCH: 29.8 pg (ref 26.0–34.0)
MCHC: 33.6 g/dL (ref 30.0–36.0)
MCV: 88.8 fL (ref 78.0–100.0)
MONOS PCT: 5 %
Monocytes Absolute: 0.4 10*3/uL (ref 0.1–1.0)
NEUTROS PCT: 75 %
Neutro Abs: 5.6 10*3/uL (ref 1.7–7.7)
Platelets: 166 10*3/uL (ref 150–400)
RBC: 3.39 MIL/uL — AB (ref 3.87–5.11)
RDW: 14.3 % (ref 11.5–15.5)
WBC: 7.5 10*3/uL (ref 4.0–10.5)

## 2015-07-11 LAB — URINE MICROSCOPIC-ADD ON

## 2015-07-11 LAB — URINALYSIS, ROUTINE W REFLEX MICROSCOPIC
BILIRUBIN URINE: NEGATIVE
Glucose, UA: NEGATIVE mg/dL
Ketones, ur: NEGATIVE mg/dL
NITRITE: NEGATIVE
PH: 5.5 (ref 5.0–8.0)
Protein, ur: NEGATIVE mg/dL
SPECIFIC GRAVITY, URINE: 1.015 (ref 1.005–1.030)

## 2015-07-11 MED ORDER — ONDANSETRON 4 MG PO TBDP
4.0000 mg | ORAL_TABLET | Freq: Three times a day (TID) | ORAL | Status: DC | PRN
  Administered 2015-07-11: 4 mg via ORAL
  Filled 2015-07-11 (×2): qty 1

## 2015-07-11 MED ORDER — MAGNESIUM OXIDE 400 (241.3 MG) MG PO TABS: 400.0000 mg | ORAL_TABLET | Freq: Every day | ORAL | Status: DC

## 2015-07-11 MED ORDER — IBUPROFEN 800 MG PO TABS: 800.0000 mg | ORAL_TABLET | Freq: Three times a day (TID) | ORAL | Status: DC

## 2015-07-11 MED ORDER — OXYCODONE-ACETAMINOPHEN 5-325 MG PO TABS: 1.0000 | ORAL_TABLET | ORAL | Status: DC | PRN

## 2015-07-11 NOTE — Discharge Summary (Signed)
POSTOPERATIVE DISCHARGE SUMMARY:  Patient ID: Kelsey [name] MRN: [id number] DOB/AGE: Nov 09, 1980 35 y.o.  Admit date: 07/08/2015 Admission Diagnoses: 41 weeks / elective induction of labor / significant musculoskeletal pain of pregnancy affecting ability to ambulate   Discharge date:  07/11/2015 Discharge Diagnoses: POD 3 s/p cesarean section for [facility]  Prenatal history: G1P1001   EDC : 07/01/2015, by Last Menstrual Period  Prenatal care at [facility]  Primary provider : Pamala Hurry Prenatal course complicated by hx depression & anxiety - using Amitriptyline / significant musculoskeletal pain in pregnancy affecting ambulation and ADL - using wheelchair and crutches in final weeks of pregnancy / GERD with persistent nausea requiring Protonix in pregnancy / Migraines (suppression with Amitriptyline) / Asthma - stable in pregnancy  Prenatal Labs: ABO, Rh: --/--/B POS, B POS (02/18 0055)  Antibody: NEG (02/18 0055) Rubella: Immune (07/06 0000)  RPR: Non Reactive (02/18 0055)  HBsAg: Negative (07/06 0000)  HIV: Non-reactive (07/06 0000)  GBS: Negative (02/03 0000)   Medical / Surgical History :  Past medical history:  Past Medical History  Diagnosis Date  . Asthma   . Depression   . GERD (gastroesophageal reflux disease)   . Frequent headaches   . Migraines   . IBS (irritable bowel syndrome)   . Panic attacks   . Vertigo 12/31/2013  . Anxiety and depression   . Hx of varicella   . Endometriosis   . Postpartum care following cesarean delivery (2/18) 07/08/2015    Past surgical history:  Past Surgical History  Procedure Laterality Date  . No past surgeries    . Cesarean section N/A 07/08/2015    Procedure: CESAREAN SECTION;  Surgeon: Aloha Gell, MD;  Location: [facility];  Service: Obstetrics;  Laterality: N/A;    Family History:  Family History  Problem Relation Age of Onset  . Hypertension Mother   . Cancer Mother     multiple myeloma  . Hypertension  Father   . Stroke Father   . Cancer Maternal Grandmother     CLL    Social History:  reports that she has never smoked. She has never used smokeless tobacco. She reports that she does not drink alcohol or use illicit drugs.  Allergies: Catfish and Citric acid   Current Medications at time of admission:  Prior to Admission medications   Medication Sig Start Date End Date Taking? Authorizing Provider  amitriptyline (ELAVIL) 25 MG tablet Take 1 tablet daily Patient taking differently: Take 12.5 mg by mouth at bedtime. Take 1 tablet daily 12/27/14  Yes Cameron Sprang, MD  Calcium Carbonate Antacid (TUMS E-X PO) Take 2 tablets by mouth daily.   Yes Historical Provider, MD  fluticasone (FLONASE) 50 MCG/ACT nasal spray 2 SPRAYS EACH NOSTRIL EVERY DAY 05/30/15  Yes Lucretia Kern, DO  pantoprazole (PROTONIX) 20 MG tablet Take 20 mg by mouth daily.   Yes Historical Provider, MD  Prenatal Vit-Fe Fumarate-FA (PRENATAL MULTIVITAMIN) TABS tablet Take 1 tablet by mouth daily at 12 noon.   Yes Historical Provider, MD  albuterol (PROVENTIL HFA) 108 (90 BASE) MCG/ACT inhaler Inhale 2 puffs into the lungs every 4 (four) hours as needed. 01/17/14   Lucretia Kern, DO  Doxylamine-Pyridoxine (DICLEGIS) 10-10 MG TBEC Take 2 tablets by mouth at bedtime. Patient not taking: Reported on 07/08/2015 11/07/14   Graciela Husbands, CNM    Intrapartum Course:  Admit for induction labor with labor progression to 4cm dilation with recurrent FHR prolonged decelerations with category 2  tracing & inadequate ctx  Pain management: epidural Complicated by: NRFHR Interventions required: cesarean delivery   Procedures: Cesarean section delivery on 07/08/2015 with delivery of female newborn by Dr Pamala Hurry   See operative report for further details APGAR (1 MIN): 9   APGAR (5 MINS): 9    Postoperative / postpartum course:  Uncomplicated with discharge on POD 3  Discharge Instructions:  Discharged Condition: stable  Activity:  pelvic rest and postoperative restrictions x 2   Diet: routine  Medications:    Medication List    STOP taking these medications        Doxylamine-Pyridoxine 10-10 MG Tbec  Commonly known as:  DICLEGIS     TUMS E-X PO      TAKE these medications        albuterol 108 (90 Base) MCG/ACT inhaler  Commonly known as:  PROVENTIL HFA  Inhale 2 puffs into the lungs every 4 (four) hours as needed.     amitriptyline 25 MG tablet  Commonly known as:  ELAVIL  Take 1 tablet daily     fluticasone 50 MCG/ACT nasal spray  Commonly known as:  FLONASE  2 SPRAYS EACH NOSTRIL EVERY DAY     ibuprofen 800 MG tablet  Commonly known as:  ADVIL,MOTRIN  Take 1 tablet (800 mg total) by mouth every 8 (eight) hours.     magnesium oxide 400 (241.3 Mg) MG tablet  Commonly known as:  MAGOX 400  Take 1 tablet (400 mg total) by mouth daily.     oxyCODONE-acetaminophen 5-325 MG tablet  Commonly known as:  PERCOCET/ROXICET  Take 1-2 tablets by mouth every 4 (four) hours as needed for moderate pain or severe pain.     pantoprazole 20 MG tablet  Commonly known as:  PROTONIX  Take 20 mg by mouth daily.     prenatal multivitamin Tabs tablet  Take 1 tablet by mouth daily at 12 noon.        Wound Care: keep clean and dry / remove honeycomb POD 5 Postpartum Instructions: [facility] discharge booklet - instructions reviewed  Discharge to: Home  Follow up :  [facility] in 6 weeks for routine postpartum visit with Dr Pamala Hurry                Signed: Artelia Laroche CNM, MSN, [facility] 07/11/2015, 9:31 AM

## 2015-07-11 NOTE — Lactation Note (Signed)
This note was copied from a baby's chart. Lactation Consultation Note  Patient Name: Kelsey [name] [id number] Date: 07/11/2015 Reason for consult: Follow-up assessment;Other (Comment) (mom has pumped X 2 in the last 24 hours and hand exressed , plans to rent a DEBP )  Baby is 50 hours old , and hasn't been to the breast since yesterday , has been fed with curved tip syringe ( formula ) , or 33F  feeding tube ( finger feeding )  Or presently bottle.  Per mom pumping is getting better and less painful adjusting settings also feeling very comfortable with hand expressing.  LC reviewed set up of the DEBP [facility] for [facility] and explained it to mom and dad . Reminded them both there is also an instruction manual.  $40.00 received for 2 week rental and receipt given to dad.  LC encouraged mom to call and set up and LC O/P appt. If she desires to re-latch baby and soreness improves once milk comes in.  [facility] reassured mom LC services supports moms pumping and bottle feeding. Mother informed of post-discharge support and given phone number to the lactation department, including services for phone call assistance; out-patient  appointments; and breastfeeding support group. List of other breastfeeding resources in the community given in the handout. Encouraged mother to call  for problems or concerns related to breastfeeding.   Maternal Data    Feeding Feeding Type: Formula Nipple Type: Slow - flow  LATCH Score/Interventions                Intervention(s): Breastfeeding basics reviewed     Lactation Tools Discussed/Used Pump Review: Setup, frequency, and cleaning   Consult Status Consult Status: Complete Date: 07/11/15    Kelsey [name] 07/11/2015, 3:44 PM

## 2015-07-11 NOTE — Lactation Note (Signed)
This note was copied from a baby's chart. Lactation Consultation Note Baby has been inconsolable crying and so fussy even after feedings. Mom hasn't BF since 1230 at lunch on the 02/20. Mom c/o sore nipples. Mom has been tender the whole time. Asked mom if it was ok to give the formula in a bottle since she isn;t putting the baby to the breast and giving syring feedings, mom stated yes. Asked mom when did plan on putting baby to breast, she stated she didn't know could I send her nurse. Mom c/o nausea and pain. Reported to RN. Baby has a bad newborn rash. I;m wondering if baby is irritated by rash, or itching, or the rash hurting the baby. Baby also acts like a withdrawal baby. Acting hungry, not sleeping, irritable. Mom is on Amitriptiline which is a L2. I have concerns why is this baby crying all the time and not satisfied. Aggressively will suckle on gloved finger and will go to sleep for a short time then wake and whine. I&O WDL.  Patient Name: Kelsey [name] [id number] Date: 07/11/2015 Reason for consult: Follow-up assessment;Infant weight loss   Maternal Data    Feeding Feeding Type: Formula Nipple Type: Slow - flow  LATCH Score/Interventions                      Lactation Tools Discussed/Used Tools: Bottle   Consult Status Consult Status: Follow-up Date: 07/11/15 Follow-up type: In-patient    Charyl Dancer 07/11/2015, 5:26 AM

## 2015-07-11 NOTE — Progress Notes (Signed)
POSTOPERATIVE DAY # 3 S/P CS  S:         Reports feeling aching all over / warm off and on all night / chest feels warm to touch             Tolerating po intake / + nausea / no vomiting / + flatus / no BM / hx GERD prior to pregnancy             Bleeding is light             Pain controlled withpercocet             Up ad lib / ambulatory/ voiding QS  Newborn breast & formula feeding    O:  VS: BP 109/62 mmHg  Pulse 79  Temp(Src) 98.3 F (36.8 C) (Oral)  Resp 18  Ht  (1.651 m)  Wt 81.647 kg (180 lb)  BMI 29.95 kg/m2  SpO2 100%  LMP 09/24/2014  Breastfeeding? Unknown   LABS: normal CBC / decreasing WBC              Recent Labs  07/09/15 0535 07/11/15 0745  WBC 11.1* 7.5  HGB 10.7* 10.1*  PLT 166 166               Bloodtype: --/--/B POS, B POS (02/18 0055)  Rubella: Immune (07/06 0000)              Flu and tdap current                               Physical Exam:             Alert and Oriented X3  Lungs: Clear and unlabored             Breasts: filling  Heart: regular rate and rhythm / no mumurs  Abdomen: soft, non-tender, mildly distended, active BS             Fundus: firm, non-tender, U-1             Dressing intact honeycomb              Incision:  approximated with suture / no erythema / no ecchymosis / no drainage  Perineum: intact  Lochia: light  Extremities: 2+ pedal edema, no calf pain or tenderness, negative Homans  A:        POD # 3 S/P CS            GERD  P:        Routine postoperative care              DC home - reviewed at length DC instructions & WOB booklet / care at home post-op               Discussed hormonal changes - hot flashes / night sweats & inflammation with engorgement as milk begins to come in            Continue Protonix x 1 week then return to Zantac pre-pregnant dose     Marlinda Mike CNM, MSN, [facility] 07/11/2015, 8:24 AM

## 2015-07-11 NOTE — Progress Notes (Signed)
Pt. Reports feeling nauseous and having chills. Vital signs WNL. Denyse Amass, CNM notified. Sublingual Zofran, cath UA and CBC/diff ordered.

## 2015-07-11 NOTE — Lactation Note (Signed)
This note was copied from a baby's chart. Lactation Consultation Note  Mother pumping with #30 Flanges on low setting and states she is able to tolerate pumping. Encouraged her to pump 4-6 x day for 10-15 min if she can tolerate it. Reviewed engorgement care and monitoring voids/stools. Provided paperwork for 2 week breast pump rental.  Encouraged parents to call when ready with paperwork.  Patient Name: Boy Nan Maya [id number] Date: 07/11/2015 Reason for consult: Follow-up assessment   Maternal Data    Feeding Feeding Type: Formula Nipple Type: Slow - flow  LATCH Score/Interventions                      Lactation Tools Discussed/Used     Consult Status Consult Status: Complete    Hardie Pulley 07/11/2015, 11:38 AM

## 2015-07-27 ENCOUNTER — Ambulatory Visit: Payer: 59 | Admitting: Neurology

## 2015-08-03 ENCOUNTER — Ambulatory Visit: Payer: 59 | Admitting: Neurology

## 2015-08-22 LAB — HM PAP SMEAR: HM PAP: NEGATIVE

## 2015-08-26 ENCOUNTER — Emergency Department
Admission: EM | Admit: 2015-08-26 | Discharge: 2015-08-26 | Disposition: A | Discharge status: Home / Self Care | Payer: 59 | Source: Home / Self Care | Attending: Family Medicine | Admitting: Family Medicine

## 2015-08-26 ENCOUNTER — Encounter: Payer: Self-pay | Admitting: Emergency Medicine

## 2015-08-26 ENCOUNTER — Other Ambulatory Visit: Payer: Self-pay | Admitting: Emergency Medicine

## 2015-08-26 DIAGNOSIS — J069 Acute upper respiratory infection, unspecified: Secondary | ICD-10-CM | POA: Diagnosis not present

## 2015-08-26 DIAGNOSIS — N61 Mastitis without abscess: Secondary | ICD-10-CM

## 2015-08-26 DIAGNOSIS — R300 Dysuria: Secondary | ICD-10-CM

## 2015-08-26 LAB — POCT URINALYSIS DIP (MANUAL ENTRY)
Glucose, UA: NEGATIVE
Leukocytes, UA: NEGATIVE
Nitrite, UA: NEGATIVE
Protein Ur, POC: 30 — AB
Spec Grav, UA: 1.02 (ref 1.005–1.03)
Urobilinogen, UA: 0.2 (ref 0–1)
pH, UA: 5.5 (ref 5–8)

## 2015-08-26 LAB — URINALYSIS, ROUTINE W REFLEX MICROSCOPIC

## 2015-08-26 LAB — POCT RAPID STREP A (OFFICE): Rapid Strep A Screen: NEGATIVE

## 2015-08-26 MED ORDER — IBUPROFEN 600 MG PO TABS
600.0000 mg | ORAL_TABLET | Freq: Once | ORAL | Status: AC
  Administered 2015-08-26: 600 mg via ORAL

## 2015-08-26 MED ORDER — CEPHALEXIN 500 MG PO CAPS: 500.0000 mg | ORAL_CAPSULE | Freq: Four times a day (QID) | ORAL | Status: DC

## 2015-08-26 NOTE — Discharge Instructions (Signed)
You may take 400-600mg Ibuprofen (Motrin) every 6-8 hours for fever and pain  °Alternate with Tylenol  °You may take 500mg Tylenol every 4-6 hours as needed for fever and pain  °Follow-up with your primary care provider next week for recheck of symptoms if not improving.  °Be sure to drink plenty of fluids and rest, at least 8hrs of sleep a night, preferably more while you are sick. °Return urgent care or go to closest ER if you cannot keep down fluids/signs of dehydration, fever not reducing with Tylenol, difficulty breathing/wheezing, stiff neck, worsening condition, or other concerns (see below)  °Please take antibiotics as prescribed and be sure to complete entire course even if you start to feel better to ensure infection does not come back. ° ° °Cool Mist Vaporizers °Vaporizers may help relieve the symptoms of a cough and cold. They add moisture to the air, which helps mucus to become thinner and less sticky. This makes it easier to breathe and cough up secretions. Cool mist vaporizers do not cause serious burns like hot mist vaporizers, which may also be called steamers or humidifiers. Vaporizers have not been proven to help with colds. You should not use a vaporizer if you are allergic to mold. °HOME CARE INSTRUCTIONS °· Follow the package instructions for the vaporizer. °· Do not use anything other than distilled water in the vaporizer. °· Do not run the vaporizer all of the time. This can cause mold or bacteria to grow in the vaporizer. °· Clean the vaporizer after each time it is used. °· Clean and dry the vaporizer well before storing it. °· Stop using the vaporizer if worsening respiratory symptoms develop. °  °This information is not intended to replace advice given to you by your health care provider. Make sure you discuss any questions you have with your health care provider. °  °Document Released: 02/01/2004 Document Revised: 05/11/2013 Document Reviewed: 09/23/2012 °Elsevier Interactive Patient  Education ©2016 [employer] ° °

## 2015-08-26 NOTE — ED Notes (Signed)
[facility]/O pain in the left breast and fever since last night . Patient states that she is breast feeding her baby and that the left breast feels engorged. She also C/O slight dysuria with the first urine denies any other symptoms.

## 2015-08-26 NOTE — ED Provider Notes (Signed)
CSN: [id number]     Arrival date & time 08/26/15  6283 History   First MD Initiated Contact with Patient 08/26/15 1018     Chief Complaint  Patient presents with  . Fever  . Breast Pain   (Consider location/radiation/quality/duration/timing/severity/associated sxs/prior Treatment) HPI  The pt is a 35yo female presenting to [facility] with c/o pain in her Left breast and fever Tmax 104*F last night.  She had been trying to nurse her baby every 2 hours but still feels engorged.  This was her first pregnancy.  Pt also c/o scratchy throat, fatigue, bilateral ear pain, and mild dysuria with her first urine.  Mild nausea but no vomiting.  Her husband did have a stomach bug a few days ago.  Last month, pt was seen at another urgent care for flu-like symptoms. She tested Negative for influenza but was still treated empirically for the flu.  Symptoms did resolve but seem to have come back this week.  Denies cough or shortness of breathing.  Past Medical History  Diagnosis Date  . Asthma   . Depression   . GERD (gastroesophageal reflux disease)   . Frequent headaches   . Migraines   . IBS (irritable bowel syndrome)   . Panic attacks   . Vertigo 12/31/2013  . Anxiety and depression   . Hx of varicella   . Endometriosis   . Postpartum care following cesarean delivery (2/18) 07/08/2015   Past Surgical History  Procedure Laterality Date  . No past surgeries    . Cesarean section N/A 07/08/2015    Procedure: CESAREAN SECTION;  Surgeon: Aloha Gell, MD;  Location: [facility];  Service: Obstetrics;  Laterality: N/A;   Family History  Problem Relation Age of Onset  . Hypertension Mother   . Cancer Mother     multiple myeloma  . Hypertension Father   . Stroke Father   . Cancer Maternal Grandmother     CLL   Social History  Substance Use Topics  . Smoking status: Never Smoker   . Smokeless tobacco: Never Used  . Alcohol Use: No   OB History    Gravida Para Term Preterm AB TAB SAB Ectopic Multiple  Living   '1 1 1      ' 0 1     Review of Systems  Constitutional: Positive for fever, chills, appetite change and fatigue.  HENT: Positive for ear pain and sore throat ( scratchy). Negative for congestion, trouble swallowing and voice change.   Respiratory: Negative for cough and shortness of breath.   Cardiovascular: Positive for chest pain ( Left breast). Negative for palpitations.  Gastrointestinal: Positive for nausea. Negative for vomiting, abdominal pain and diarrhea.  Musculoskeletal: Negative for myalgias, back pain and arthralgias.  Skin: Positive for color change ( Left breast). Negative for rash.    Allergies  Catfish and Citric acid  Home Medications   Prior to Admission medications   Medication Sig Start Date End Date Taking? Authorizing Provider  albuterol (PROVENTIL HFA) 108 (90 BASE) MCG/ACT inhaler Inhale 2 puffs into the lungs every 4 (four) hours as needed. 01/17/14   Lucretia Kern, DO  amitriptyline (ELAVIL) 25 MG tablet Take 1 tablet daily Patient taking differently: Take 12.5 mg by mouth at bedtime. Take 1 tablet daily 12/27/14   Cameron Sprang, MD  cephALEXin (KEFLEX) 500 MG capsule Take 1 capsule (500 mg total) by mouth 4 (four) times daily. 08/26/15   Noland Fordyce, PA-C  fluticasone (FLONASE) 50 MCG/ACT nasal spray  2 SPRAYS EACH NOSTRIL EVERY DAY 05/30/15   Lucretia Kern, DO  ibuprofen (ADVIL,MOTRIN) 800 MG tablet Take 1 tablet (800 mg total) by mouth every 8 (eight) hours. 07/11/15   Artelia Laroche, CNM  magnesium oxide (MAGOX 400) 400 (241.3 Mg) MG tablet Take 1 tablet (400 mg total) by mouth daily. 07/11/15   Artelia Laroche, CNM  oxyCODONE-acetaminophen (PERCOCET/ROXICET) 5-325 MG tablet Take 1-2 tablets by mouth every 4 (four) hours as needed for moderate pain or severe pain. 07/11/15   Artelia Laroche, CNM  pantoprazole (PROTONIX) 20 MG tablet Take 20 mg by mouth daily.    Historical Provider, MD  Prenatal Vit-Fe Fumarate-FA (PRENATAL MULTIVITAMIN) TABS tablet Take 1 tablet by  mouth daily at 12 noon.    Historical Provider, MD   Meds Ordered and Administered this Visit   Medications  ibuprofen (ADVIL,MOTRIN) tablet 600 mg (600 mg Oral Given 08/26/15 1026)    BP 124/79 mmHg  Temp(Src) 102.1 F (38.9 C) (Oral)  Resp 14  Ht '5\' 5"'  (1.651 m)  Wt 144 lb (65.318 kg)  BMI 23.96 kg/m2  SpO2 99%  Breastfeeding? Yes No data found.   Physical Exam  Constitutional: She appears well-developed and well-nourished. No distress.  HENT:  Head: Normocephalic and atraumatic.  Right Ear: Tympanic membrane is injected.  Left Ear: Tympanic membrane normal.  Nose: Rhinorrhea present.  Mouth/Throat: Uvula is midline and mucous membranes are normal. Posterior oropharyngeal erythema present. No oropharyngeal exudate, posterior oropharyngeal edema or tonsillar abscesses.  Eyes: Conjunctivae are normal. No scleral icterus.  Neck: Normal range of motion. Neck supple.  Cardiovascular: Normal rate, regular rhythm and normal heart sounds.   Pulmonary/Chest: Effort normal and breath sounds normal. No stridor. No respiratory distress. She has no wheezes. She has no rales. She exhibits no tenderness. Left breast exhibits tenderness. Left breast exhibits no nipple discharge.    Abdominal: Soft. She exhibits no distension. There is no tenderness.  Musculoskeletal: Normal range of motion.  Lymphadenopathy:    She has no cervical adenopathy.  Neurological: She is alert.  Skin: Skin is warm and dry. She is not diaphoretic.  Nursing note and vitals reviewed.   ED Course  Procedures (including critical care time)  Labs Review Labs Reviewed  POCT URINALYSIS DIP (MANUAL ENTRY) - Abnormal; Notable for the following:    Bilirubin, UA small (*)    Ketones, POC UA large (80) (*)    Blood, UA small (*)    Protein Ur, POC =30 (*)    All other components within normal limits  STREP A DNA PROBE  URINALYSIS, ROUTINE W REFLEX MICROSCOPIC (NOT AT [facility])  POCT RAPID STREP A (OFFICE)     Imaging Review No results found.    MDM   1. Mastitis, left, acute   2. Acute upper respiratory infection   3. Dysuria     Pt c/o Left breast pain and swelling, fever, and URI symptoms.  Temp 103.6*F in UC.  Pt given Ibuprofen.   No acute distress.  Exam c/w mild mastitis of Left breast.    UA: ketones and protein present. No leukocytes or nitrites, will send culture. Rapid strep: Negative Will send culture.  Temp trending down from 103.6*F to 102.1*F  Rx: Keflex 537m QID for 10 days Discussed alternating acetaminophen and ibuprofen for fever and pain. Encouraged to get rest and stay well hydrated. F/u with OB/GYN or PCP in 3-4 days if not improving, sooner if worsening. Patient verbalized understanding and agreement with treatment plan.  Noland Fordyce, PA-C 08/26/15 1128

## 2015-08-27 LAB — STREP A DNA PROBE: GASP: NOT DETECTED

## 2015-08-29 ENCOUNTER — Telehealth: Payer: Self-pay | Admitting: *Deleted

## 2015-08-29 LAB — URINE CULTURE
Colony Count: NO GROWTH
Organism ID, Bacteria: NO GROWTH

## 2015-09-24 ENCOUNTER — Other Ambulatory Visit: Payer: Self-pay | Admitting: Family Medicine

## 2015-11-16 ENCOUNTER — Other Ambulatory Visit: Payer: Self-pay | Admitting: Family Medicine

## 2015-11-16 MED ORDER — FLUTICASONE PROPIONATE 50 MCG/ACT NA SUSP: NASAL | Status: DC

## 2015-12-21 ENCOUNTER — Other Ambulatory Visit: Payer: Self-pay | Admitting: Family Medicine

## 2016-01-24 DIAGNOSIS — J301 Allergic rhinitis due to pollen: Secondary | ICD-10-CM | POA: Insufficient documentation

## 2016-01-24 DIAGNOSIS — F419 Anxiety disorder, unspecified: Secondary | ICD-10-CM | POA: Insufficient documentation

## 2016-01-24 DIAGNOSIS — G43009 Migraine without aura, not intractable, without status migrainosus: Secondary | ICD-10-CM | POA: Insufficient documentation

## 2016-01-26 ENCOUNTER — Other Ambulatory Visit: Payer: Self-pay | Admitting: Neurology

## 2016-01-26 DIAGNOSIS — F0781 Postconcussional syndrome: Secondary | ICD-10-CM

## 2016-01-26 NOTE — Telephone Encounter (Signed)
Rx sent to pharmacy. LVM notifying pt to call and schedule an appt.

## 2016-02-23 ENCOUNTER — Other Ambulatory Visit: Payer: Self-pay | Admitting: Neurology

## 2016-02-23 DIAGNOSIS — F0781 Postconcussional syndrome: Secondary | ICD-10-CM

## 2016-02-23 NOTE — Telephone Encounter (Signed)
Refused RX request: Patient needs an appointment.  

## 2016-06-21 ENCOUNTER — Emergency Department (INDEPENDENT_AMBULATORY_CARE_PROVIDER_SITE_OTHER)
Admission: EM | Admit: 2016-06-21 | Discharge: 2016-06-21 | Disposition: A | Discharge status: Home / Self Care | Payer: 59 | Source: Home / Self Care | Attending: Family Medicine | Admitting: Family Medicine

## 2016-06-21 ENCOUNTER — Encounter: Payer: Self-pay | Admitting: *Deleted

## 2016-06-21 DIAGNOSIS — R6889 Other general symptoms and signs: Secondary | ICD-10-CM

## 2016-06-21 LAB — POCT CBC W AUTO DIFF (K'VILLE URGENT CARE)

## 2016-06-21 NOTE — ED Provider Notes (Signed)
[name]    CSN: [id number] Arrival date & time: 06/21/16  1013     History   Chief Complaint Chief Complaint  Patient presents with  . Generalized Body Aches  . Fever  . Breast Problem    HPI Anuradha Chabot is a 36 y.o. female.   Patient continues to breast feed although she is in the process of weaning.  Ten days ago she was started on Keflex 569m TID for left mastitis.  She improved and was instructed to decrease her Keflex to BID.  Three days ago she developed fever to 100.4 with chills, fatigue and myalgias.  No sore throat, sinus congestion, cough or GU symptoms.  Her PCP advised her to increase her Keflex back to TID.  She states that her fever resolved last night and no chills today.  She also noted two loose stools this morning (not diarrhea) which she admits may have been a result of her anxiety.  No hematochezia or mucous in stool.   The history is provided by the patient.    Past Medical History:  Diagnosis Date  . Anxiety and depression   . Asthma   . Depression   . Endometriosis   . Frequent headaches   . GERD (gastroesophageal reflux disease)   . Hx of varicella   . IBS (irritable bowel syndrome)   . Migraines   . Panic attacks   . Postpartum care following cesarean delivery (2/18) 07/08/2015  . Vertigo 12/31/2013    Patient Active Problem List   Diagnosis Date Noted  . Pregnancy 07/08/2015  . Postpartum care following cesarean delivery (2/18) 07/08/2015  . Abnormal laboratory test 09/07/2014  . Nausea and vomiting 09/07/2014  . Asthma, mild persistent 03/08/2014  . GERD (gastroesophageal reflux disease) 03/08/2014  . Seasonal allergies 03/08/2014  . IBS (irritable bowel syndrome) 03/08/2014  . Anxiety and depression 03/08/2014  . Chronic daily headache 12/31/2013  . Vertigo 12/31/2013  . Postconcussive syndrome 12/31/2013    Past Surgical History:  Procedure Laterality Date  . CESAREAN SECTION N/A 07/08/2015   Procedure:  CESAREAN SECTION;  Surgeon: KAloha Gell MD;  [facility];  Service: Obstetrics;  Laterality: N/A;  . NO PAST SURGERIES      OB History    Gravida Para Term Preterm AB Living   '1 1 1     1   ' SAB TAB Ectopic Multiple Live Births         0 1       Home Medications    Prior to Admission medications   Medication Sig Start Date End Date Taking? Authorizing Provider  Cephalexin (KEFLEX PO) Take by mouth.   Yes Historical Provider, MD  ketoconazole (NIZORAL) 200 MG tablet Take by mouth daily.   Yes Historical Provider, MD  albuterol (PROVENTIL HFA) 108 (90 BASE) MCG/ACT inhaler Inhale 2 puffs into the lungs every 4 (four) hours as needed. 01/17/14   HLucretia Kern DO  amitriptyline (ELAVIL) 25 MG tablet TAKE 1 TABLET BY MOUTH EVERY DAY 01/26/16   KCameron Sprang MD  fluticasone ([facility] 50 MCG/ACT nasal spray [facility] 11/16/15   HLucretia Kern DO  magnesium oxide (MAGOX 400) 400 (241.3 Mg) MG tablet Take 1 tablet (400 mg total) by mouth daily. 07/11/15   TArtelia Laroche CNM  pantoprazole (PROTONIX) 20 MG tablet Take 20 mg by mouth daily.    Historical Provider, MD  Prenatal Vit-Fe Fumarate-FA (PRENATAL MULTIVITAMIN) TABS tablet  Take 1 tablet by mouth daily at 12 noon.    Historical Provider, MD    Family History Family History  Problem Relation Age of Onset  . Hypertension Mother   . Cancer Mother     multiple myeloma  . Hypertension Father   . Stroke Father   . Cancer Maternal Grandmother     CLL    Social History Social History  Substance Use Topics  . Smoking status: Never Smoker  . Smokeless tobacco: Never Used  . Alcohol use No     Allergies   Catfish [fish allergy] and Citric acid   Review of Systems Review of Systems No sore throat No cough No pleuritic pain No wheezing No nasal congestion No post-nasal drainage No sinus pain/pressure No itchy/red eyes No earache No hemoptysis No SOB + fever, + chills No nausea No  vomiting No abdominal pain ? diarrhea No urinary symptoms No skin rash + fatigue + myalgias + headache Used OTC meds without relief   Physical Exam Triage Vital Signs ED Triage Vitals  Enc Vitals Group     BP 06/21/16 1102 119/84     Pulse Rate 06/21/16 1102 90     Resp 06/21/16 1102 14     Temp 06/21/16 1102 98.4 F (36.9 C)     Temp Source 06/21/16 1102 Oral     SpO2 06/21/16 1102 100 %     Weight 06/21/16 1102 141 lb (64 kg)     Height --      Head Circumference --      Peak Flow --      Pain Score 06/21/16 1105 3     Pain Loc --      Pain Edu? --      Excl. in [place]? --    No data found.   Updated Vital Signs BP 119/84 (BP Location: Left Arm)   Pulse 90   Temp 98.4 F (36.9 C) (Oral)   Resp 14   Wt 141 lb (64 kg)   SpO2 100%   Breastfeeding? Yes   BMI 23.46 kg/m   Visual Acuity Right Eye Distance:   Left Eye Distance:   Bilateral Distance:    Right Eye Near:   Left Eye Near:    Bilateral Near:     Physical Exam Nursing notes and Vital Signs reviewed. Appearance:  Patient appears stated age, and in no acute distress Eyes:  Pupils are equal, round, and reactive to light and accomodation.  Extraocular movement is intact.  Conjunctivae are not inflamed  Ears:  Canals normal.  Tympanic membranes normal.  Nose:  Mildly congested turbinates.  No sinus tenderness.  Pharynx:  Normal Neck:  Supple.  Slightly tender promintnt posterior/lateral nodes are palpated bilaterally  Lungs:  Clear to auscultation.  Breath sounds are equal.  Moving air well. Heart:  Regular rate and rhythm without murmurs, rubs, or gallops.  Abdomen:  Nontender without masses or hepatosplenomegaly.  Bowel sounds are present.  No CVA or flank tenderness.  Extremities:  No edema.  Skin:  No rash present.    UC Treatments / Results  Labs (all labs ordered are listed, but only abnormal results are displayed) Labs Reviewed  POCT INFLUENZA A/B negative  POCT CBC W AUTO DIFF ([facility]):  WBC 9.6; LY 15.2; MO 4.8; GR 80.0; Hgb 13.8; Platelets 283     EKG  EKG Interpretation None       Radiology No results found.  Procedures Procedures (including  critical care time)  Medications Ordered in UC Medications - No data to display   Initial Impression / Assessment and Plan / UC Course  I have reviewed the triage vital signs and the nursing notes.  Pertinent labs & imaging results that were available during my care of the patient were reviewed by me and considered in my medical decision making (see chart for details).    Normal WBC reassuring.  Note negative flu test. Suspect that patient's symptoms and fever improved as a result of resuming Keflex 567m TID dose. Continue Keflex as prescribed. May return for flu immunization in 3 to 5 days if afebrile. Followup with Dr. SFrederico Hamman    Final Clinical Impressions(s) / UC Diagnoses   Final diagnoses:  Flu-like symptoms    New Prescriptions New Prescriptions   No medications on file     SKandra Nicolas MD 06/21/16 1230

## 2016-06-21 NOTE — ED Triage Notes (Signed)
Patient was treated for left mastitis 12 days ago by dermatologist with Keflex and Nizoral. Breast has improved but now has tenderness and lump on right breast. 2 days ago developed fever, body aches and nausea. Awoke today with diarrhea. She reports anxiety  sometimes cause her to have diarrhea. She was advised by Dr. Karleen HampshireSpencer to be evaluated for the flu. No flu vac this season. She is currently nursing.

## 2016-06-21 NOTE — Discharge Instructions (Signed)
Continue Keflex as prescribed. May return for flu immunization in 3 to 5 days if afebrile.

## 2016-06-25 ENCOUNTER — Telehealth: Payer: Self-pay | Admitting: *Deleted

## 2016-06-25 NOTE — Telephone Encounter (Signed)
Callback: Patient reports her symptoms are improved. She still has the lump in her breast that she will continue to followup on with her doctor.

## 2016-08-26 ENCOUNTER — Ambulatory Visit (INDEPENDENT_AMBULATORY_CARE_PROVIDER_SITE_OTHER): Payer: 59 | Admitting: Family Medicine

## 2016-08-26 ENCOUNTER — Encounter: Payer: Self-pay | Admitting: Family Medicine

## 2016-08-26 VITALS — BP 118/67 | HR 88 | Ht 65.25 in | Wt 146.0 lb

## 2016-08-26 DIAGNOSIS — R42 Dizziness and giddiness: Secondary | ICD-10-CM | POA: Diagnosis not present

## 2016-08-26 DIAGNOSIS — J302 Other seasonal allergic rhinitis: Secondary | ICD-10-CM

## 2016-08-26 DIAGNOSIS — G44329 Chronic post-traumatic headache, not intractable: Secondary | ICD-10-CM

## 2016-08-26 DIAGNOSIS — F0781 Postconcussional syndrome: Secondary | ICD-10-CM | POA: Diagnosis not present

## 2016-08-26 DIAGNOSIS — R519 Headache, unspecified: Secondary | ICD-10-CM

## 2016-08-26 DIAGNOSIS — J4530 Mild persistent asthma, uncomplicated: Secondary | ICD-10-CM | POA: Diagnosis not present

## 2016-08-26 DIAGNOSIS — R51 Headache: Secondary | ICD-10-CM

## 2016-08-26 MED ORDER — RIZATRIPTAN BENZOATE 10 MG PO TBDP: ORAL_TABLET | ORAL | 12 refills | Status: DC

## 2016-08-26 MED ORDER — AMITRIPTYLINE HCL 25 MG PO TABS: 12.5000 mg | ORAL_TABLET | Freq: Every day | ORAL | 3 refills | Status: DC

## 2016-08-26 NOTE — Progress Notes (Signed)
Kelsey [name] is a 36 y.o. female who presents to [name]: Primary Care Sports Medicine today for establish care and discuss right knee pain, postconcussion syndrome, and asthma.  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 She developed postconcussion symptoms resulting in chronic headaches, and vestibular problems including vertigo and visual accommodation problems. She's had an extensive workup with neurology and overall is pretty satisfied with how things are going. She takes amitriptyline 12 on a half milligrams at night most nights which helped to prevent her headaches. She'll occasionally use a triptan with a severe headache. She has attended vestibular physical therapy and think she is doing reasonably well.  Right knee pain: Patient notes some right knee pain associated with crepitations on extension. Symptoms are worse with deep squats and lunges. She denies any radiating pain weakness or numbness effusion locking or catching or giving way. She has not done much exercises yet.  Asthma: Patient is a history of mild intermittent asthma. She has no albuterol inhaler which she does not use regularly. She uses Flonase and occasionally Zyrtec for seasonal allergy symptoms.  She uses progesterone only mini pill for birth control. She has a 80-month-old baby that she recently stopped breast-feeding. She receives her OB/GYN care at [facility] via Dr. Ernestina Penna.      Past Medical History:  Diagnosis Date  . Anxiety and depression   . Asthma   . Depression   . Endometriosis   . Frequent headaches   . GERD (gastroesophageal reflux disease)   . Hx of varicella   . IBS (irritable bowel syndrome)   . Migraines   . Panic attacks   . Postpartum care following cesarean delivery (2/18) 07/08/2015  . Vertigo 12/31/2013   Past Surgical  History:  Procedure Laterality Date  . CESAREAN SECTION N/A 07/08/2015   Procedure: CESAREAN SECTION;  Surgeon: Noland Fordyce, MD;  Location: [facility];  Service: Obstetrics;  Laterality: N/A;  . NO PAST SURGERIES     Social History  Substance Use Topics  . Smoking status: Never Smoker  . Smokeless tobacco: Never Used  . Alcohol use No   family history includes Cancer in her maternal grandmother and mother; Hypertension in her father and mother; Stroke in her father.  ROS as above: No new headache, visual changes, nausea, vomiting, diarrhea, constipation, dizziness, abdominal pain, skin rash, fevers, chills, night sweats, weight loss, swollen lymph nodes, body aches, joint swelling, muscle aches, chest pain, shortness of breath, mood changes, visual or auditory hallucinations.   Medications: Current Outpatient Prescriptions  Medication Sig Dispense Refill  . MAGNESIUM PO Take by mouth.    . rizatriptan (MAXALT-MLT) 10 MG disintegrating tablet Take  1 tablet at the onset of your headache, repeat a dose in 2 hours if you need more relief. Do not use more than 3 days a week. 10 tablet 12  . albuterol (PROVENTIL HFA) 108 (90 BASE) MCG/ACT inhaler Inhale 2 puffs into the lungs every 4 (four) hours as needed. 3 Inhaler 0  . amitriptyline (ELAVIL) 25 MG tablet Take 0.5 tablets (12.5 mg total) by mouth at bedtime. 45 tablet 3  . BIOTIN PO Take by mouth.    . fluticasone (FLONASE) 50 MCG/ACT nasal spray INSTILL 2 SPRAYS IN EACH NOSTRIL EVERY DAY 16 g 1  . norethindrone (MICRONOR,CAMILA,ERRIN) 0.35 MG tablet Take by mouth.    . pantoprazole (PROTONIX) 20 MG tablet Take  20 mg by mouth daily.    . Prenatal Vit-Fe Fumarate-FA (PRENATAL MULTIVITAMIN) TABS tablet Take 1 tablet by mouth daily at 12 noon.    . Riboflavin 400 MG TABS Take by mouth.     No current facility-administered medications for this visit.    Allergies  Allergen Reactions  . Catfish [Fish Allergy] Shortness Of Breath  .  Fish-Derived Products Shortness Of Breath  . Pineapple Hives  . Citric Acid     Per patient report  . Tomato Flavor  [Flavoring Agent]     Mouth sores    Health Maintenance Health Maintenance  Topic Date Due  . PAP SMEAR  08/18/2016  . INFLUENZA VACCINE  12/18/2016  . TETANUS/TDAP  03/08/2024  . HIV Screening  Completed     Exam:  BP 118/67   Pulse 88   Ht 5' 5.25" (1.657 m)   Wt 146 lb (66.2 kg)   BMI 24.11 kg/m   Gen: Well NAD HEENT: EOMI,  MMM Lungs: Normal work of breathing. CTABL Heart: RRR no MRG Abd: NABS, Soft. Nondistended, Nontender Exts: Brisk capillary refill, warm and well perfused.  Right knee: Normal-appearing however increased Q angle present. Range of motion 0-100 with 1+ patellar crepitations on extension. Nontender. Stable ligamentous exam. Intact strength. Normal gait.   No results found for this or any previous visit (from the past 72 hour(s)). No results found.    Assessment and Plan: 36 y.o. female with  Postconcussion syndrome resulting in chronic daily headache and vestibular problems. Continue amitriptyline and uses triptan as needed. We'll reconsider vestibular therapy if patient desires. Watchful waiting otherwise.  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 Work on quadriceps and hip abduction strengthening.  Asthma: Doing well. Continue intermittent albuterol.    No orders of the defined types were placed in this encounter.  Meds ordered this encounter  Medications  . BIOTIN PO    Sig: Take by mouth.  . Riboflavin 400 MG TABS    Sig: Take by mouth.  . norethindrone (MICRONOR,CAMILA,ERRIN) 0.35 MG tablet    Sig: Take by mouth.  . DISCONTD: magnesium oxide (MAG-OX) 400 MG tablet    Sig: Take 400 mg by mouth.  . DISCONTD: rizatriptan (MAXALT-MLT) 10 MG disintegrating tablet    Sig: Take  1 tablet at the onset of your headache, repeat a dose in 2 hours if you need more relief. Do not use more than  3 days a week.  Marland Kitchen MAGNESIUM PO    Sig: Take by mouth.  . rizatriptan (MAXALT-MLT) 10 MG disintegrating tablet    Sig: Take  1 tablet at the onset of your headache, repeat a dose in 2 hours if you need more relief. Do not use more than 3 days a week.    Dispense:  10 tablet    Refill:  12  . amitriptyline (ELAVIL) 25 MG tablet    Sig: Take 0.5 tablets (12.5 mg total) by mouth at bedtime.    Dispense:  45 tablet    Refill:  3     Discussed warning signs or symptoms. Please see discharge instructions. Patient expresses understanding.  CC:  Dr Ernestina Penna

## 2016-08-26 NOTE — Patient Instructions (Addendum)
Thank you for coming in today. Continue amitriptyline  Use rizatriptan as needed.  Recheck in  1 year or sooner if needed.  Please get records sent my way.  Return sooner if needed.   I recommend Krill oil daily.   Leg exercises.  Side leg raises.  Front leg raises  Toe out front straight leg raises.  30 reps 2-3 sets daily.   Patellofemoral Pain Syndrome Patellofemoral pain syndrome is a condition that involves a softening or breakdown of the tissue (cartilage) on the underside of your kneecap (patella). This causes pain in the front of the knee. The condition is also called runner's knee or chondromalacia patella. Patellofemoral pain syndrome is most common in young adults who are active in sports. Your knee is the largest joint in your body. The patella covers the front of your knee and is attached to muscles above and below your knee. The underside of the patella is covered with a smooth type of cartilage (synovium). The smooth surface helps the patella glide easily when you move your knee. Patellofemoral pain syndrome causes swelling in the joint linings and bone surfaces in your knee. What are the causes? Patellofemoral pain syndrome can be caused by:  Overuse.  Poor alignment of your knee joints.  Weak leg muscles.  A direct blow to your kneecap. What increases the risk? You may be at risk for patellofemoral pain syndrome if you:  Do a lot of activities that can wear down your kneecap. These include:  Running.  Squatting.  Climbing stairs.  Start a new physical activity or exercise program.  Wear shoes that do not fit well.  Do not have good leg strength.  Are overweight. What are the signs or symptoms? Knee pain is the most common symptom of patellofemoral pain syndrome. This may feel like a dull, aching pain underneath your patella, in the front of your knee. There may be a popping or cracking sound when you move your knee. Pain may get worse  with:  Exercise.  Climbing stairs.  Running.  Jumping.  Squatting.  Kneeling.  Sitting for a long time.  Moving or pushing on your patella. How is this diagnosed? Your health care provider may be able to diagnose patellofemoral pain syndrome from your symptoms and medical history. You may be asked about your recent physical activities and which ones cause knee pain. Your health care provider may do a physical exam with certain tests to confirm the diagnosis. These may include:  Moving your patella back and forth.  Checking your range of knee motion.  Having you squat or jump to see if you have pain.  Checking the strength of your leg muscles. An MRI of the knee may also be done. How is this treated? Patellofemoral pain syndrome can usually be treated at home with rest, ice, compression, and elevation (RICE). Other treatments may include:  Nonsteroidal anti-inflammatory drugs (NSAIDs).  Physical therapy to stretch and strengthen your leg muscles.  Shoe inserts (orthotics) to take stress off your knee.  A knee brace or knee support.  Surgery to remove damaged cartilage or move the patella to a better position. The need for surgery is rare. Follow these instructions at home:  Take medicines only as directed by your health care provider.  Rest your knee.  When resting, keep your knee raised above the level of your heart.  Avoid activities that cause knee pain.  Apply ice to the injured area:  Put ice in a plastic bag.  Place a towel between your skin and the bag.  Leave the ice on for 20 minutes, 2-3 times a day.  Use splints, braces, knee supports, or walking aids as directed by your health care provider.  Perform stretching and strengthening exercises as directed by your health care provider or physical therapist.  Keep all follow-up visits as directed by your health care provider. This is important. Contact a health care provider if:  Your symptoms get  worse.  You are not improving with home care. This information is not intended to replace advice given to you by your health care provider. Make sure you discuss any questions you have with your health care provider. Document Released: 04/24/2009 Document Revised: 10/12/2015 Document Reviewed: 07/26/2013 Elsevier Interactive Patient Education  2017 [employer].

## 2016-09-03 ENCOUNTER — Other Ambulatory Visit: Payer: Self-pay | Admitting: Family Medicine

## 2016-09-29 ENCOUNTER — Emergency Department
Admission: EM | Admit: 2016-09-29 | Discharge: 2016-09-29 | Discharge status: Absent without leave | Payer: 59 | Source: Home / Self Care

## 2017-04-08 ENCOUNTER — Other Ambulatory Visit: Payer: Self-pay | Admitting: Family Medicine

## 2017-05-24 ENCOUNTER — Encounter: Payer: Self-pay | Admitting: Emergency Medicine

## 2017-05-24 ENCOUNTER — Other Ambulatory Visit: Payer: Self-pay

## 2017-05-24 ENCOUNTER — Emergency Department (INDEPENDENT_AMBULATORY_CARE_PROVIDER_SITE_OTHER)
Admission: EM | Admit: 2017-05-24 | Discharge: 2017-05-24 | Disposition: A | Discharge status: Home / Self Care | Payer: 59 | Source: Home / Self Care | Attending: Family Medicine | Admitting: Family Medicine

## 2017-05-24 ENCOUNTER — Emergency Department (INDEPENDENT_AMBULATORY_CARE_PROVIDER_SITE_OTHER): Payer: 59

## 2017-05-24 DIAGNOSIS — M7671 Peroneal tendinitis, right leg: Secondary | ICD-10-CM

## 2017-05-24 DIAGNOSIS — M25571 Pain in right ankle and joints of right foot: Secondary | ICD-10-CM | POA: Diagnosis not present

## 2017-05-24 NOTE — ED Provider Notes (Signed)
[name]    CSN: [id number] Arrival date & time: 05/24/17  1702     History   Chief Complaint Chief Complaint  Patient presents with  . Ankle Pain    HPI Kelsey [name] is a 37 y.o. female.   Patient complains of pain in her right lateral ankle for about two months.  She recalls no injury.  She has increased paisn when standing and wearing high heels.   The history is provided by the patient.  Ankle Pain  Time since incident:  2 months Injury: no   Pain details:    Quality:  Aching   Radiates to: posterior calf.   Severity:  Mild   Onset quality:  Gradual   Duration:  2 months   Timing:  Constant   Progression:  Worsening Chronicity:  New Prior injury to area:  No Relieved by:  Nothing Exacerbated by: standing and wearing high heels. Associated symptoms: decreased ROM   Associated symptoms: no muscle weakness, no numbness, no stiffness, no swelling and no tingling     Past Medical History:  Diagnosis Date  . Anxiety and depression   . Asthma   . Depression   . Endometriosis   . Frequent headaches   . GERD (gastroesophageal reflux disease)   . Hx of varicella   . IBS (irritable bowel syndrome)   . Migraines   . Panic attacks   . Postpartum care following cesarean delivery (2/18) 07/08/2015  . Vertigo 12/31/2013    Patient Active Problem List   Diagnosis Date Noted  . Abnormal laboratory test 09/07/2014  . Asthma, mild persistent 03/08/2014  . GERD (gastroesophageal reflux disease) 03/08/2014  . Seasonal allergies 03/08/2014  . IBS (irritable bowel syndrome) 03/08/2014  . Anxiety and depression 03/08/2014  . Chronic daily headache 12/31/2013  . Vertigo 12/31/2013  . Postconcussive syndrome 12/31/2013    Past Surgical History:  Procedure Laterality Date  . CESAREAN SECTION N/A 07/08/2015   Procedure: CESAREAN SECTION;  Surgeon: Aloha Gell, MD;  Location: [facility];  Service: Obstetrics;  Laterality: N/A;  . NO PAST SURGERIES       OB History    Gravida Para Term Preterm AB Living   '1 1 1     1   ' SAB TAB Ectopic Multiple Live Births         0 1       Home Medications    Prior to Admission medications   Medication Sig Start Date End Date Taking? Authorizing Provider  albuterol (PROVENTIL HFA) 108 (90 BASE) MCG/ACT inhaler Inhale 2 puffs into the lungs every 4 (four) hours as needed. 01/17/14   Lucretia Kern, DO  amitriptyline (ELAVIL) 25 MG tablet Take 0.5 tablets (12.5 mg total) by mouth at bedtime. 08/26/16   Gregor Hams, MD  BIOTIN PO Take by mouth.    [provider]  fluticasone (FLONASE) 50 MCG/ACT nasal spray 2 SPRAYS BY EACH NARE ROUTE DAILY. 04/08/17   Gregor Hams, MD  MAGNESIUM PO Take by mouth.    [provider]  norethindrone (MICRONOR,CAMILA,ERRIN) 0.35 MG tablet Take by mouth.    [provider]  pantoprazole (PROTONIX) 20 MG tablet Take 20 mg by mouth daily.    [provider]  pantoprazole (PROTONIX) 40 MG tablet TAKE 1 TABLET BY MOUTH EVERY DAY 09/04/16   Gregor Hams, MD  Prenatal Vit-Fe Fumarate-FA (PRENATAL MULTIVITAMIN) TABS tablet Take 1 tablet by mouth daily at 12 noon.  [provider]  Riboflavin 400 MG TABS Take by mouth.    [provider]  rizatriptan (MAXALT-MLT) 10 MG disintegrating tablet Take  1 tablet at the onset of your headache, repeat a dose in 2 hours if you need more relief. Do not use more than 3 days a week. 08/26/16   Gregor Hams, MD    Family History Family History  Problem Relation Age of Onset  . Hypertension Mother   . Cancer Mother        multiple myeloma  . Hypertension Father   . Stroke Father   . Cancer Maternal Grandmother        CLL    Social History Social History   Tobacco Use  . Smoking status: Never Smoker  . Smokeless tobacco: Never Used  Substance Use Topics  . Alcohol use: No    Alcohol/week: 0.0 oz  . Drug use: No     Allergies   Catfish [fish allergy]; Fish-derived  products; Pineapple; Citric acid; and Tomato flavor  [flavoring agent]   Review of Systems Review of Systems  Musculoskeletal: Negative for stiffness.  All other systems reviewed and are negative.    Physical Exam Triage Vital Signs ED Triage Vitals  Enc Vitals Group     BP 05/24/17 1717 116/71     Pulse Rate 05/24/17 1717 79     Resp 05/24/17 1717 16     Temp 05/24/17 1717 98 F (36.7 C)     Temp Source 05/24/17 1717 Oral     SpO2 05/24/17 1717 100 %     Weight 05/24/17 1719 140 lb (63.5 kg)     Height 05/24/17 1719 '5\' 4"'  (1.626 m)     Head Circumference --      Peak Flow --      Pain Score 05/24/17 1720 7     Pain Loc --      Pain Edu? --      Excl. in [place]? --    No data found.  Updated Vital Signs BP 116/71 (BP Location: Left Arm)   Pulse 79   Temp 98 F (36.7 C) (Oral)   Resp 16   Ht '5\' 4"'  (1.626 m)   Wt 140 lb (63.5 kg)   SpO2 100%   BMI 24.03 kg/m   Visual Acuity Right Eye Distance:   Left Eye Distance:   Bilateral Distance:    Right Eye Near:   Left Eye Near:    Bilateral Near:     Physical Exam  Constitutional: She appears well-developed and well-nourished. No distress.  HENT:  Head: Normocephalic.  Eyes: Pupils are equal, round, and reactive to light.  Cardiovascular: Normal rate.  Pulmonary/Chest: Effort normal.  Musculoskeletal:       Right ankle: She exhibits decreased range of motion. She exhibits no swelling, no ecchymosis and no deformity. Tenderness. Achilles tendon normal.       Feet:  There is distinct tenderness over the course of the right peroneal tendon and base of 5th metatarsal.  Pain is elicited with resisted eversion and resisted plantar flexion of the ankle.  Distal neurovascular function is intact.    Neurological: She is alert.  Nursing note and vitals reviewed.    UC Treatments / Results  Labs (all labs ordered are listed, but only abnormal results are displayed) Labs Reviewed - No data to display  EKG  EKG  Interpretation None       Radiology Dg Ankle Complete Right  Result  Date: 05/24/2017 CLINICAL DATA:  Patient with ankle swelling. Unable to bear weight. Initial encounter. EXAM: RIGHT ANKLE - COMPLETE 3+ VIEW COMPARISON:  None. FINDINGS: Normal anatomic alignment. No evidence for acute fracture or dislocation. Regional soft tissues are unremarkable. IMPRESSION: No acute osseous abnormality. Electronically Signed   By: Lovey Newcomer M.D.   On: 05/24/2017 18:12    Procedures Procedures (including critical care time)  Medications Ordered in UC Medications - No data to display   Initial Impression / Assessment and Plan / UC Course  I have reviewed the triage vital signs and the nursing notes.  Pertinent labs & imaging results that were available during my care of the patient were reviewed by me and considered in my medical decision making (see chart for details).    Dispensed AirCast stirrup splint. Wear AirCast stirrup splint daytime.  Use crutches for 3 to 5 days until pain improves.  Apply ice pack for 20 to 30 minutes, 3 to 4 times daily  Continue until pain and swelling decrease.   Begin Ibuprofen 230m, 4 tabs every 8 hours with food.  Begin range of motion and stretching exercises as tolerated. [facility] if not improving about three weeks.     Final Clinical Impressions(s) / UC Diagnoses   Final diagnoses:  Peroneal tendinitis of right lower extremity    ED Discharge Orders    None           BKandra Nicolas MD 05/26/17 1213

## 2017-05-24 NOTE — Discharge Instructions (Signed)
Wear AirCast stirrup splint daytime.  Use crutches for 3 to 5 days until pain improves.  Apply ice pack for 20 to 30 minutes, 3 to 4 times daily  Continue until pain and swelling decrease.   Begin Ibuprofen 200mg , 4 tabs every 8 hours with food.  Begin range of motion and stretching exercises as tolerated.

## 2017-05-24 NOTE — ED Triage Notes (Signed)
Patient c/o Swelling to the R Ankle since October unable to bear weight on it

## 2017-06-09 ENCOUNTER — Encounter: Payer: Self-pay | Admitting: Family Medicine

## 2017-06-09 ENCOUNTER — Ambulatory Visit (INDEPENDENT_AMBULATORY_CARE_PROVIDER_SITE_OTHER): Payer: 59 | Admitting: Family Medicine

## 2017-06-09 VITALS — BP 123/78 | HR 86 | Ht 65.0 in | Wt 141.0 lb

## 2017-06-09 DIAGNOSIS — M2550 Pain in unspecified joint: Secondary | ICD-10-CM | POA: Insufficient documentation

## 2017-06-09 DIAGNOSIS — M249 Joint derangement, unspecified: Secondary | ICD-10-CM

## 2017-06-09 MED ORDER — DICLOFENAC SODIUM 1 % TD GEL: 4.0000 g | Freq: Four times a day (QID) | TRANSDERMAL | 11 refills | Status: AC

## 2017-06-09 NOTE — Progress Notes (Signed)
Kelsey [name] is a 37 y.o. female who presents to [place] [place] today for ankle pain and swelling and wrist pain and knee pain.  Kelsey [name] notes right ankle pain and swelling occurring without injury.  She was seen in urgent care for this on January 5 where x-rays were unremarkable.  She was thought to have peroneal tendinitis.  She used a clamshell Aircast which helped a little but is generally uncomfortable.  She also notes bilateral wrist pain and bilateral knee pain.  She notes all of these pain symptoms have worsened over the last several weeks without injury.  She notes occasional joint swelling for no known cause.  She denies any fevers or chills.  She denies any personal or known family history for rheumatologic disease.   Past Medical History:  Diagnosis Date  . Anxiety and depression   . Asthma   . Depression   . Endometriosis   . Frequent headaches   . GERD (gastroesophageal reflux disease)   . Hx of varicella   . IBS (irritable bowel syndrome)   . Migraines   . Panic attacks   . Postpartum care following cesarean delivery (2/18) 07/08/2015  . Vertigo 12/31/2013   Past Surgical History:  Procedure Laterality Date  . CESAREAN SECTION N/A 07/08/2015   Procedure: CESAREAN SECTION;  Surgeon: Aloha Gell, MD;  Location: [facility];  Service: Obstetrics;  Laterality: N/A;  . NO PAST SURGERIES     Social History   Tobacco Use  . Smoking status: Never Smoker  . Smokeless tobacco: Never Used  Substance Use Topics  . Alcohol use: No    Alcohol/week: 0.0 oz   family history includes Cancer in her maternal grandmother and mother; Hypertension in her father and mother; Stroke in her father.  ROS as above:  Medications: Current Outpatient Medications  Medication Sig Dispense Refill  . albuterol (PROVENTIL HFA) 108 (90 BASE) MCG/ACT inhaler Inhale 2 puffs into  the lungs every 4 (four) hours as needed. 3 Inhaler 0  . amitriptyline (ELAVIL) 25 MG tablet Take 0.5 tablets (12.5 mg total) by mouth at bedtime. 45 tablet 3  . BIOTIN PO Take by mouth.    . fluticasone (FLONASE) 50 MCG/ACT nasal spray 2 SPRAYS BY EACH NARE ROUTE DAILY. 16 g 6  . MAGNESIUM PO Take by mouth.    . norethindrone (MICRONOR,CAMILA,ERRIN) 0.35 MG tablet Take by mouth.    . pantoprazole (PROTONIX) 20 MG tablet Take 20 mg by mouth daily.    . pantoprazole (PROTONIX) 40 MG tablet TAKE 1 TABLET BY MOUTH EVERY DAY 30 tablet 11  . Prenatal Vit-Fe Fumarate-FA (PRENATAL MULTIVITAMIN) TABS tablet Take 1 tablet by mouth daily at 12 noon.    . Riboflavin 400 MG TABS Take by mouth.    . rizatriptan (MAXALT-MLT) 10 MG disintegrating tablet Take  1 tablet at the onset of your headache, repeat a dose in 2 hours if you need more relief. Do not use more than 3 days a week. 10 tablet 12  . diclofenac sodium (VOLTAREN) 1 % GEL Apply 4 g topically 4 (four) times daily. To affected joint. 100 g 11   No current facility-administered medications for this visit.    Allergies  Allergen Reactions  . Catfish [Fish Allergy] Shortness Of Breath  . Fish-Derived Products Shortness Of Breath  . Pineapple Hives  . Citric Acid     Per patient report  . Tomato Flavor  [Flavoring Agent]  Mouth sores    Health Maintenance Health Maintenance  Topic Date Due  . PAP SMEAR  08/18/2016  . TETANUS/TDAP  03/08/2024  . INFLUENZA VACCINE  Completed  . HIV Screening  Completed     Exam:  BP 123/78   Pulse 86   Ht '5\' 5"'  (1.651 m)   Wt 141 lb (64 kg)   BMI 23.46 kg/m  Gen: Well NAD HEENT: EOMI,  MMM Lungs: Normal work of breathing. CTABL Heart: RRR no MRG Abd: NABS, Soft. Nondistended, Nontender Exts: Brisk capillary refill, warm and well perfused.  MSK: Wrists bilaterally are normal-appearing without tenderness or effusion with normal range of motion Knees bilaterally normal-appearing nontender  normal motion Right ankle mild effusion. Tender to palpation at the anterior aspect of the ankle mildly. Slight laxity to talar tilt testing. Stable ligamentous exam otherwise. Intact strength.  Beighton hypermobility score 6/9   No results found for this or any previous visit (from the past 72 hour(s)). No results found.    Assessment and Plan: 37 y.o. female with right ankle effusion in the setting of multiple joint pain without clear cause is concerning for rheumatologic process.  Right ankle x-ray was unremarkable on the fifth.  It is possible that she has multiple different discrete orthopedic issues but I am somewhat concerned for some rheumatologic process in this 37 year old woman.  Plan for rheumatology workup listed below.  Patient is also hypermobile.  She does not have the characteristic features of Marfan's or Ehlers-Danlos syndrome however benign hypermobility syndrome is a possibility.  Symptom management with ibuprofen and diclofenac gel.  Recheck in a few weeks.   Orders Placed This Encounter  Procedures  . Cyclic citrul peptide antibody, IgG  . CBC with Differential/Platelet  . Comprehensive metabolic panel  . Sedimentation rate  . Rheumatoid factor  . ANA  . CK  . Uric acid  . HLA-B27 antigen   Meds ordered this encounter  Medications  . diclofenac sodium (VOLTAREN) 1 % GEL    Sig: Apply 4 g topically 4 (four) times daily. To affected joint.    Dispense:  100 g    Refill:  11     Discussed warning signs or symptoms. Please see discharge instructions. Patient expresses understanding.

## 2017-06-09 NOTE — Patient Instructions (Addendum)
Thank you for coming in today. Get labs today.  Use ibuprofen for pain.  Use the diclofenac gel as needed.   We will get lab results back to you ASAP.   Recheck with me in 4 weeks or sooner if needed.   If not doing well and labs are not helpful the next step may be draining the ankle and sending it to the lab.   Joint Pain Joint pain, which is also called arthralgia, can be caused by many things. Joint pain often goes away when you follow your health care provider's instructions for relieving pain at home. However, joint pain can also be caused by conditions that require further treatment. Common causes of joint pain include:  Bruising in the area of the joint.  Overuse of the joint.  Wear and tear on the joints that occur with aging (osteoarthritis).  Various other forms of arthritis.  A buildup of a crystal form of uric acid in the joint (gout).  Infections of the joint (septic arthritis) or of the bone (osteomyelitis).  Your health care provider may recommend medicine to help with the pain. If your joint pain continues, additional tests may be needed to diagnose your condition. Follow these instructions at home: Watch your condition for any changes. Follow these instructions as directed to lessen the pain that you are feeling.  Take medicines only as directed by your health care provider.  Rest the affected area for as long as your health care provider says that you should. If directed to do so, raise the painful joint above the level of your heart while you are sitting or lying down.  Do not do things that cause or worsen pain.  If directed, apply ice to the painful area: ? Put ice in a plastic bag. ? Place a towel between your skin and the bag. ? Leave the ice on for 20 minutes, 2-3 times per day.  Wear an elastic bandage, splint, or sling as directed by your health care provider. Loosen the elastic bandage or splint if your fingers or toes become numb and tingle, or if  they turn cold and blue.  Begin exercising or stretching the affected area as directed by your health care provider. Ask your health care provider what types of exercise are safe for you.  Keep all follow-up visits as directed by your health care provider. This is important.  Contact a health care provider if:  Your pain increases, and medicine does not help.  Your joint pain does not improve within 3 days.  You have increased bruising or swelling.  You have a fever.  You lose 10 lb (4.5 kg) or more without trying. Get help right away if:  You are not able to move the joint.  Your fingers or toes become numb or they turn cold and blue. This information is not intended to replace advice given to you by your health care provider. Make sure you discuss any questions you have with your health care provider. Document Released: 05/06/2005 Document Revised: 10/06/2015 Document Reviewed: 02/15/2014 Elsevier Interactive Patient Education  [employer]2018 [employer]

## 2017-06-12 LAB — CBC WITH DIFFERENTIAL/PLATELET
BASOS PCT: 1 %
Basophils Absolute: 38 cells/uL (ref 0–200)
Eosinophils Absolute: 141 cells/uL (ref 15–500)
Eosinophils Relative: 3.7 %
HEMATOCRIT: 40.2 % (ref 35.0–45.0)
HEMOGLOBIN: 13.9 g/dL (ref 11.7–15.5)
LYMPHS ABS: 1482 {cells}/uL (ref 850–3900)
MCH: 29.6 pg (ref 27.0–33.0)
MCHC: 34.6 g/dL (ref 32.0–36.0)
MCV: 85.5 fL (ref 80.0–100.0)
MPV: 10.8 fL (ref 7.5–12.5)
Monocytes Relative: 6.8 %
Neutro Abs: 1881 cells/uL (ref 1500–7800)
Neutrophils Relative %: 49.5 %
Platelets: 259 10*3/uL (ref 140–400)
RBC: 4.7 10*6/uL (ref 3.80–5.10)
RDW: 11.9 % (ref 11.0–15.0)
Total Lymphocyte: 39 %
WBC mixed population: 258 cells/uL (ref 200–950)
WBC: 3.8 10*3/uL (ref 3.8–10.8)

## 2017-06-12 LAB — COMPREHENSIVE METABOLIC PANEL
AG Ratio: 1.4 (calc) (ref 1.0–2.5)
ALKALINE PHOSPHATASE (APISO): 39 U/L (ref 33–115)
ALT: 12 U/L (ref 6–29)
AST: 15 U/L (ref 10–30)
Albumin: 4.3 g/dL (ref 3.6–5.1)
BILIRUBIN TOTAL: 0.5 mg/dL (ref 0.2–1.2)
BUN: 15 mg/dL (ref 7–25)
CALCIUM: 9.6 mg/dL (ref 8.6–10.2)
CHLORIDE: 105 mmol/L (ref 98–110)
CO2: 29 mmol/L (ref 20–32)
Creat: 0.98 mg/dL (ref 0.50–1.10)
GLOBULIN: 3 g/dL (ref 1.9–3.7)
Glucose, Bld: 72 mg/dL (ref 65–99)
Potassium: 3.9 mmol/L (ref 3.5–5.3)
Sodium: 140 mmol/L (ref 135–146)
Total Protein: 7.3 g/dL (ref 6.1–8.1)

## 2017-06-12 LAB — RHEUMATOID FACTOR: Rhuematoid fact SerPl-aCnc: <14 IU/mL (ref <14)

## 2017-06-12 LAB — CYCLIC CITRUL PEPTIDE ANTIBODY, IGG

## 2017-06-12 LAB — HLA-B27 ANTIGEN: HLA-B27 Antigen: NEGATIVE

## 2017-06-12 LAB — SEDIMENTATION RATE: SED RATE: 9 mm/h (ref 0–20)

## 2017-06-12 LAB — URIC ACID: Uric Acid, Serum: 4.1 mg/dL (ref 2.5–7.0)

## 2017-06-12 LAB — CK: Total CK: 111 U/L (ref 29–143)

## 2017-06-12 LAB — ANA: Anti Nuclear Antibody(ANA): NEGATIVE

## 2017-07-02 ENCOUNTER — Other Ambulatory Visit: Payer: Self-pay | Admitting: Family Medicine

## 2017-07-07 ENCOUNTER — Telehealth: Payer: 59 | Admitting: Family

## 2017-07-07 DIAGNOSIS — J111 Influenza due to unidentified influenza virus with other respiratory manifestations: Secondary | ICD-10-CM

## 2017-07-07 MED ORDER — OSELTAMIVIR PHOSPHATE 75 MG PO CAPS: 75.0000 mg | ORAL_CAPSULE | Freq: Two times a day (BID) | ORAL | 0 refills | Status: DC

## 2017-07-07 NOTE — Progress Notes (Signed)
Thank you for the details you included in the comment boxes. Those details are very helpful in determining the best course of treatment for you and help [place] to provide the best care. It is possible that the ibuprofen is keeping your fever down. The symptoms you are having along with the ones your son is having are in correlation with strong flu symptoms.  E visit for Flu like symptoms   We are sorry that you are not feeling well.  Here is how we plan to help! Based on what you have shared with me it looks like you may have possible exposure to a virus that causes influenza.  Influenza or "the flu" is   an infection caused by a respiratory virus. The flu virus is highly contagious and persons who did not receive their yearly flu vaccination may "catch" the flu from close contact.  We have anti-viral medications to treat the viruses that cause this infection. They are not a "cure" and only shorten the course of the infection. These prescriptions are most effective when they are given within the first 2 days of "flu" symptoms. Antiviral medication are indicated if you have a high risk of complications from the flu. You should  also consider an antiviral medication if you are in close contact with someone who is at risk. These medications can help patients avoid complications from the flu  but have side effects that you should know. Possible side effects from Tamiflu or oseltamivir include nausea, vomiting, diarrhea, dizziness, headaches, eye redness, sleep problems or other respiratory symptoms. You should not take Tamiflu if you have an allergy to oseltamivir or any to the ingredients in Tamiflu.  Based upon your symptoms and potential risk factors I have prescribed Oseltamivir (Tamiflu).  It has been sent to your designated pharmacy.  You will take one 75 mg capsule orally twice a day for the next 5 days.  ANYONE WHO HAS FLU SYMPTOMS SHOULD: . Stay home. The flu is highly contagious and going out or to  work exposes others! . Be sure to drink plenty of fluids. Water is fine as well as fruit juices, sodas and electrolyte beverages. You may want to stay away from caffeine or alcohol. If you are nauseated, try taking small sips of liquids. How do you know if you are getting enough fluid? Your urine should be a pale yellow or almost colorless. . Get rest. . Taking a steamy shower or using a humidifier may help nasal congestion and ease sore throat pain. Using a saline nasal spray works much the same way. . Cough drops, hard candies and sore throat lozenges may ease your cough. . Line up a caregiver. Have someone check on you regularly.   GET HELP RIGHT AWAY IF: . You cannot keep down liquids or your medications. . You become short of breath . Your fell like you are going to pass out or loose consciousness. . Your symptoms persist after you have completed your treatment plan MAKE SURE YOU   Understand these instructions.  Will watch your condition.  Will get help right away if you are not doing well or get worse.  Your e-visit answers were reviewed by a board certified advanced clinical practitioner to complete your personal care plan.  Depending on the condition, your plan could have included both over the counter or prescription medications.  If there is a problem please reply  once you have received a response from your provider.  Your safety is important to  us.  If you have drug allergies check your prescription carefully.    You can use MyChart to ask questions about today's visit, request a non-urgent call back, or ask for a work or school excuse for 24 hours related to this e-Visit. If it has been greater than 24 hours you will need to follow up with your provider, or enter a new e-Visit to address those concerns.  You will get an e-mail in the next two days asking about your experience.  I hope that your e-visit has been valuable and will speed your recovery. Thank you for using  e-visits.

## 2017-07-11 ENCOUNTER — Other Ambulatory Visit: Payer: Self-pay

## 2017-07-11 MED ORDER — AMITRIPTYLINE HCL 25 MG PO TABS: 12.5000 mg | ORAL_TABLET | Freq: Every day | ORAL | 3 refills | Status: DC

## 2017-08-06 ENCOUNTER — Other Ambulatory Visit: Payer: Self-pay

## 2017-08-06 MED ORDER — PANTOPRAZOLE SODIUM 40 MG PO TBEC: 40.0000 mg | DELAYED_RELEASE_TABLET | Freq: Every day | ORAL | 0 refills | Status: DC

## 2017-08-28 ENCOUNTER — Other Ambulatory Visit: Payer: Self-pay

## 2017-08-28 ENCOUNTER — Emergency Department (INDEPENDENT_AMBULATORY_CARE_PROVIDER_SITE_OTHER)
Admission: EM | Admit: 2017-08-28 | Discharge: 2017-08-28 | Disposition: A | Discharge status: Home / Self Care | Payer: 59 | Source: Home / Self Care | Attending: Emergency Medicine | Admitting: Emergency Medicine

## 2017-08-28 DIAGNOSIS — R110 Nausea: Secondary | ICD-10-CM

## 2017-08-28 DIAGNOSIS — R519 Headache, unspecified: Secondary | ICD-10-CM

## 2017-08-28 DIAGNOSIS — R51 Headache: Secondary | ICD-10-CM

## 2017-08-28 DIAGNOSIS — R109 Unspecified abdominal pain: Secondary | ICD-10-CM | POA: Diagnosis not present

## 2017-08-28 DIAGNOSIS — R202 Paresthesia of skin: Secondary | ICD-10-CM

## 2017-08-28 DIAGNOSIS — R200 Anesthesia of skin: Secondary | ICD-10-CM

## 2017-08-28 DIAGNOSIS — R634 Abnormal weight loss: Secondary | ICD-10-CM

## 2017-08-28 LAB — POCT CBC W AUTO DIFF (K'VILLE URGENT CARE)

## 2017-08-28 LAB — POCT URINE PREGNANCY: Preg Test, Ur: NEGATIVE

## 2017-08-28 LAB — POCT URINALYSIS DIP (MANUAL ENTRY)
Bilirubin, UA: NEGATIVE
Blood, UA: NEGATIVE
Glucose, UA: NEGATIVE mg/dL
Ketones, POC UA: NEGATIVE mg/dL
Leukocytes, UA: NEGATIVE
Nitrite, UA: NEGATIVE
PH UA: 7 (ref 5.0–8.0)
PROTEIN UA: NEGATIVE mg/dL
Spec Grav, UA: <=1.005 — AB (ref 1.010–1.025)
UROBILINOGEN UA: 0.2 U/dL

## 2017-08-28 MED ORDER — ONDANSETRON HCL 8 MG PO TABS
8.0000 mg | ORAL_TABLET | Freq: Once | ORAL | Status: AC
  Administered 2017-08-28: 8 mg via ORAL

## 2017-08-28 MED ORDER — ONDANSETRON 8 MG PO TBDP: ORAL_TABLET | ORAL | 0 refills | Status: DC

## 2017-08-28 MED ORDER — SODIUM CHLORIDE 0.9 % IV SOLN: Freq: Once | INTRAVENOUS | Status: DC

## 2017-08-28 NOTE — ED Triage Notes (Signed)
Husband had gastritis Thursday, son had on Sat.  Pt became ill on Monday with vomiting and diarrhea.  Had been weak and sensitivity to smell and taste since.  Has had stomach cramping and pain, and generalized aches.

## 2017-08-28 NOTE — ED Provider Notes (Signed)
[name]    CSN: [id number] Arrival date & time: 08/28/17  1535     History   Chief Complaint Chief Complaint  Patient presents with  . Abdominal Pain  . Nausea  . Weakness    HPI Kelsey [name] is a 37 y.o. female.  Patient states she has been sick for the last 3 days.  She states her husband and daughter were also ill with a viral type illness and they recovered.  She has continued to feel poorly.  She has persistent nausea but has not actually vomited.  She had one loose stool.  She does have a history of irritable bowel syndrome and is under treatment with amitriptyline for this.  She tried a 4 mg Zofran earlier today without improvement.  She has had some fluttering in her chest .  She has had a crampy sensation in the left side of her abdomen. HPI  Past Medical History:  Diagnosis Date  . Anxiety and depression   . Asthma   . Depression   . Endometriosis   . Frequent headaches   . GERD (gastroesophageal reflux disease)   . Hx of varicella   . IBS (irritable bowel syndrome)   . Migraines   . Panic attacks   . Postpartum care following cesarean delivery (2/18) 07/08/2015  . Vertigo 12/31/2013    Patient Active Problem List   Diagnosis Date Noted  . 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 Arthralgia 06/09/2017  . Abnormal laboratory test 09/07/2014  . Asthma, mild persistent 03/08/2014  . GERD (gastroesophageal reflux disease) 03/08/2014  . Seasonal allergies 03/08/2014  . IBS (irritable bowel syndrome) 03/08/2014  . Anxiety and depression 03/08/2014  . Chronic daily headache 12/31/2013  . Vertigo 12/31/2013  . Postconcussive syndrome 12/31/2013    Past Surgical History:  Procedure Laterality Date  . CESAREAN SECTION N/A 07/08/2015   Procedure: CESAREAN SECTION;  Surgeon: Aloha Gell, MD;  Location: [facility];  Service: Obstetrics;  Laterality: N/A;  . NO PAST SURGERIES      OB History    Gravida  1   Para  1   Term  1   Preterm      AB        Living  1     SAB      TAB      Ectopic      Multiple  0   Live Births  1            Home Medications    Prior to Admission medications   Medication Sig Start Date End Date Taking? Authorizing Provider  albuterol (PROVENTIL HFA;VENTOLIN HFA) 108 (90 Base) MCG/ACT inhaler INHALE 2 PUFFS INTO THE LUNGS EVERY 4 (FOUR) HOURS AS NEEDED FOR WHEEZING. 07/02/17   Gregor Hams, MD  amitriptyline (ELAVIL) 25 MG tablet Take 0.5 tablets (12.5 mg total) by mouth at bedtime. 07/11/17   Gregor Hams, MD  BIOTIN PO Take by mouth.    [provider]  diclofenac sodium (VOLTAREN) 1 % GEL Apply 4 g topically 4 (four) times daily. To affected joint. 06/09/17   Gregor Hams, MD  fluticasone (FLONASE) 50 MCG/ACT nasal spray 2 SPRAYS BY EACH NARE ROUTE DAILY. 04/08/17   Gregor Hams, MD  MAGNESIUM PO Take by mouth.    [provider]  norethindrone (MICRONOR,CAMILA,ERRIN) 0.35 MG tablet Take by mouth.    [provider]  ondansetron (ZOFRAN-ODT) 8 MG disintegrating tablet 1 tablet beneath your tongue every  8 hours. 08/28/17   Darlyne Russian, MD  oseltamivir (TAMIFLU) 75 MG capsule Take 1 capsule (75 mg total) by mouth 2 (two) times daily. 07/07/17   With[place], Elyse Jarvis, FNP  pantoprazole (PROTONIX) 20 MG tablet Take 20 mg by mouth daily.    [provider]  pantoprazole (PROTONIX) 40 MG tablet Take 1 tablet (40 mg total) by mouth daily. APPOINTMENT NEEDED FOR FURTHER REFILLS 08/06/17   Gregor Hams, MD  Prenatal Vit-Fe Fumarate-FA (PRENATAL MULTIVITAMIN) TABS tablet Take 1 tablet by mouth daily at 12 noon.    [provider]  Riboflavin 400 MG TABS Take by mouth.    [provider]  rizatriptan (MAXALT-MLT) 10 MG disintegrating tablet Take  1 tablet at the onset of your headache, repeat a dose in 2 hours if you need more relief. Do not use more than 3 days a week. 08/26/16   Gregor Hams, MD    Family History Family History  Problem Relation  Age of Onset  . Hypertension Mother   . Cancer Mother        multiple myeloma  . Hypertension Father   . Stroke Father   . Cancer Maternal Grandmother        CLL    Social History Social History   Tobacco Use  . Smoking status: Never Smoker  . Smokeless tobacco: Never Used  Substance Use Topics  . Alcohol use: No    Alcohol/week: 0.0 oz  . Drug use: No     Allergies   Catfish [fish allergy]; Fish-derived products; Pineapple; Citric acid; and Tomato flavor  [flavoring agent]   Review of Systems Review of Systems  Constitutional: Positive for chills and fever.  HENT: Negative.   Eyes: Negative.   Respiratory: Negative.   Cardiovascular: Positive for palpitations.  Gastrointestinal: Positive for nausea. Negative for vomiting.  Genitourinary:       She is on birth control and is due to start her menses soon.  Skin: Negative.   Neurological:       Patient has a history of migraines and a history of previous traumatic brain injury.  While in the office she did he felt some tingling numbness in the tips of her fingers right hand.  Psychiatric/Behavioral: Negative.      Physical Exam Triage Vital Signs ED Triage Vitals  Enc Vitals Group     BP 08/28/17 1602 119/79     Pulse Rate 08/28/17 1602 77     Resp --      Temp 08/28/17 1602 98.5 F (36.9 C)     Temp Source 08/28/17 1602 Oral     SpO2 08/28/17 1602 100 %     Weight 08/28/17 1603 134 lb (60.8 kg)     Height 08/28/17 1603 _0  (1.651 m)     Head Circumference --      Peak Flow --      Pain Score 08/28/17 1603 0     Pain Loc --      Pain Edu? --      Excl. in [place]? --    Orthostatic VS for the past 24 hrs:  BP- Lying Pulse- Lying BP- Sitting Pulse- Sitting  08/28/17 1830 127/82 62 117/73 76    Updated Vital Signs BP 119/79 (BP Location: Right Arm)   Pulse 77   Temp 98.5 F (36.9 C) (Oral)   Ht _1  (1.651 m)   Wt 134 lb (60.8 kg)   LMP 08/16/2017  SpO2 100%   BMI 22.30 kg/m   Visual  Acuity Right Eye Distance:   Left Eye Distance:   Bilateral Distance:    Right Eye Near:   Left Eye Near:    Bilateral Near:     Physical Exam  Constitutional: She is oriented to person, place, and time. She appears well-developed and well-nourished.  HENT:  Mouth/Throat: Oropharynx is clear and moist.  Cardiovascular: Normal rate and regular rhythm.  Pulmonary/Chest: Effort normal and breath sounds normal.  Abdominal:  The abdomen is flat.  Bowel sounds are normal.  There is very minimal left upper abdominal discomfort without rebound.  There is no lower abdominal tenderness.  Neurological: She is alert and oriented to person, place, and time.  There is a subjective sensation of tingling in the tips of her fingers right hand.  Grip strength is normal.  Cranial nerve evaluation is normal.  I did not see any hemorrhages or exudates on funduscopic exam.  Skin: Skin is warm and dry.     UC Treatments / Results  Labs (all labs ordered are listed, but only abnormal results are displayed) Labs Reviewed  POCT URINALYSIS DIP (MANUAL ENTRY) - Abnormal; Notable for the following components:      Result Value   Spec Grav, UA <=1.005 (*)    All other components within normal limits  COMPREHENSIVE METABOLIC PANEL  LIPASE  AMYLASE  POCT CBC W AUTO DIFF ([facility])  POCT URINE PREGNANCY    EKG None Radiology No results found.  Procedures Procedures (including critical care time)  Medications Ordered in UC Medications  ondansetron (ZOFRAN) tablet 8 mg (8 mg Oral Given 08/28/17 1653)     Initial Impression / Assessment and Plan / UC Course  I have reviewed the triage vital signs and the nursing notes.  Pertinent labs & imaging results that were available during my care of the patient were reviewed by me and considered in my medical decision making (see chart for details). Patient with a history of irritable bowel syndrome presents with one loose stool and nausea with  decreased p.o. intake.  She has a documented weight loss of 141 weight 06/09/2017 to a weight today of 134.  She has not actually vomited but feels nauseated.  The Zofran only helped a small amount.  We will go ahead and give a liter of IV fluids.  Her white count was 3000 with 49% lymphs.  Hemoglobin 12 7 platelets 210,000.  I did a comprehensive metabolic panel to check her electrolytes and renal function.  We were unable to get an IV in.  She was tolerating p.o. intake and able to eat crackers.  Of note she did state she felt some tingling in the fingertips of her right hand.  She did have an MRI of her head in 2015 at [facility] and this was negative for acute injury.  She has a history of migraines and does have a double headache on the right side.  This could be a neurological symptom of her migraines which would also explain her persistent nausea.  Patient did not vomit while in office.  She has 3 small cans of ginger ale and her specific gravity was 1.005.  After drinking in the office.  The tingling in her right hand had resolved when she left.  Her neurological examination was normal.  Her white cell count was very low and will need follow-up.      Final Clinical Impressions(s) / UC Diagnoses  Final diagnoses:  Nausea  Abdominal pain, unspecified abdominal location  Loss of weight  Numbness and tingling in right hand  Right-sided headache    ED Discharge Orders        Ordered    ondansetron (ZOFRAN-ODT) 8 MG disintegrating tablet     08/28/17 1737     Take Zofran as instructed. Try and take small amounts of liquids. Make an appointment for follow-up with your primary care physician. I will call with lab results.   Controlled Substance Prescriptions [place] Controlled Substance Registry consulted? Not Applicable   Darlyne Russian, MD 08/28/17 2249

## 2017-08-28 NOTE — Discharge Instructions (Addendum)
Take Zofran as instructed. Try and take small amounts of liquids. Make an appointment for follow-up with your primary care physician. I will call with lab results. Follow-up with Dr. Denyse Amassorey at 940 tomorrow If you have worsening neurological symptoms as far as worsening headache or develop any weakness in your right arm please go to the emergency room for further evaluation

## 2017-08-29 ENCOUNTER — Encounter: Payer: Self-pay | Admitting: Family Medicine

## 2017-08-29 ENCOUNTER — Telehealth: Payer: Self-pay | Admitting: Emergency Medicine

## 2017-08-29 ENCOUNTER — Ambulatory Visit (INDEPENDENT_AMBULATORY_CARE_PROVIDER_SITE_OTHER): Payer: 59 | Admitting: Family Medicine

## 2017-08-29 VITALS — BP 115/79 | HR 83 | Wt 134.0 lb

## 2017-08-29 DIAGNOSIS — A084 Viral intestinal infection, unspecified: Secondary | ICD-10-CM

## 2017-08-29 DIAGNOSIS — M25572 Pain in left ankle and joints of left foot: Secondary | ICD-10-CM | POA: Diagnosis not present

## 2017-08-29 DIAGNOSIS — M25571 Pain in right ankle and joints of right foot: Secondary | ICD-10-CM | POA: Diagnosis not present

## 2017-08-29 DIAGNOSIS — K588 Other irritable bowel syndrome: Secondary | ICD-10-CM | POA: Diagnosis not present

## 2017-08-29 LAB — COMPREHENSIVE METABOLIC PANEL
AG Ratio: 1.4 (calc) (ref 1.0–2.5)
ALBUMIN MSPROF: 3.8 g/dL (ref 3.6–5.1)
ALT: 18 U/L (ref 6–29)
AST: 18 U/L (ref 10–30)
Alkaline phosphatase (APISO): 44 U/L (ref 33–115)
BUN: 10 mg/dL (ref 7–25)
CHLORIDE: 105 mmol/L (ref 98–110)
CO2: 26 mmol/L (ref 20–32)
CREATININE: 0.98 mg/dL (ref 0.50–1.10)
Calcium: 8.6 mg/dL (ref 8.6–10.2)
GLOBULIN: 2.7 g/dL (ref 1.9–3.7)
GLUCOSE: 87 mg/dL (ref 65–99)
POTASSIUM: 4.2 mmol/L (ref 3.5–5.3)
Sodium: 137 mmol/L (ref 135–146)
TOTAL PROTEIN: 6.5 g/dL (ref 6.1–8.1)
Total Bilirubin: 0.3 mg/dL (ref 0.2–1.2)

## 2017-08-29 MED ORDER — PROMETHAZINE HCL 25 MG PO TABS: 25.0000 mg | ORAL_TABLET | Freq: Four times a day (QID) | ORAL | 2 refills | Status: DC | PRN

## 2017-08-29 NOTE — Patient Instructions (Addendum)
Thank you for coming in today. Use zofran or phenergan for nausea or vomiting as needed.  Continue fluids and let me know how you are doing.  If not better let me know.   For ankles continue the diclofenac gel.  Conisider ankle sleeve.   Recheck as needed.

## 2017-08-29 NOTE — Progress Notes (Signed)
Kelsey [name] is a 37 y.o. female who presents to [name]: Primary Care Sports Medicine today for follow-up nausea vomiting.  Kelsey [name] notes a several day history of nausea associated with a few episodes of vomiting or dry heaving.  She has multiple family members with similar illness.  She denies any significant abdominal pain or urinary symptoms.  She has symptoms been ongoing now for about 4 days.  She was seen in urgent care yesterday where she was prescribed Zofran which is helped a little and had a laboratory workup including normal metabolic panel and CBC relatively normal with blood cell count of 3 with mildly increased lymphocytes.  She notes she still feels nauseated but overall is improved.  She has a personal history of IBS.  Additionally she notes that her ankle pain and swelling.  She was seen for this a few months ago and notes that diclofenac gel is very helpful.  She no longer has much pain but notes occasional swelling by the end of the day.  Past Medical History:  Diagnosis Date  . Anxiety and depression   . Asthma   . Depression   . Endometriosis   . Frequent headaches   . GERD (gastroesophageal reflux disease)   . Hx of varicella   . IBS (irritable bowel syndrome)   . Migraines   . Panic attacks   . Postpartum care following cesarean delivery (2/18) 07/08/2015  . Vertigo 12/31/2013   Past Surgical History:  Procedure Laterality Date  . CESAREAN SECTION N/A 07/08/2015   Procedure: CESAREAN SECTION;  Surgeon: Noland Fordyce, MD;  Location: [facility];  Service: Obstetrics;  Laterality: N/A;  . NO PAST SURGERIES     Social History   Tobacco Use  . Smoking status: Never Smoker  . Smokeless tobacco: Never Used  Substance Use Topics  . Alcohol use: No    Alcohol/week: 0.0 oz   family history includes Cancer in her maternal grandmother and mother; Hypertension in her father  and mother; Stroke in her father.  ROS as above:  Medications: Current Outpatient Medications  Medication Sig Dispense Refill  . albuterol (PROVENTIL HFA;VENTOLIN HFA) 108 (90 Base) MCG/ACT inhaler INHALE 2 PUFFS INTO THE LUNGS EVERY 4 (FOUR) HOURS AS NEEDED FOR WHEEZING. 6.7 Inhaler 1  . amitriptyline (ELAVIL) 25 MG tablet Take 0.5 tablets (12.5 mg total) by mouth at bedtime. 45 tablet 3  . BIOTIN PO Take by mouth.    . diclofenac sodium (VOLTAREN) 1 % GEL Apply 4 g topically 4 (four) times daily. To affected joint. 100 g 11  . fluticasone (FLONASE) 50 MCG/ACT nasal spray 2 SPRAYS BY EACH NARE ROUTE DAILY. 16 g 6  . MAGNESIUM PO Take by mouth.    . norethindrone (MICRONOR,CAMILA,ERRIN) 0.35 MG tablet Take by mouth.    . ondansetron (ZOFRAN-ODT) 8 MG disintegrating tablet 1 tablet beneath your tongue every 8 hours. 12 tablet 0  . pantoprazole (PROTONIX) 40 MG tablet Take 1 tablet (40 mg total) by mouth daily. APPOINTMENT NEEDED FOR FURTHER REFILLS 30 tablet 0  . Prenatal Vit-Fe Fumarate-FA (PRENATAL MULTIVITAMIN) TABS tablet Take 1 tablet by mouth daily at 12 noon.    . Riboflavin 400 MG TABS Take by mouth.    . rizatriptan (MAXALT-MLT) 10 MG disintegrating tablet Take  1 tablet at the onset of your headache, repeat a dose in 2 hours if you need more relief. Do not use more than 3 days a week. 10 tablet  12  . promethazine (PHENERGAN) 25 MG tablet Take 1 tablet (25 mg total) by mouth every 6 (six) hours as needed for nausea or vomiting (or migraine). 30 tablet 2   No current facility-administered medications for this visit.    Allergies  Allergen Reactions  . Catfish [Fish Allergy] Shortness Of Breath  . Fish-Derived Products Shortness Of Breath  . Pineapple Hives  . Citric Acid     Per patient report  . Tomato Flavor  [Flavoring Agent]     Mouth sores    Health Maintenance Health Maintenance  Topic Date Due  . PAP SMEAR  08/18/2016  . INFLUENZA VACCINE  12/18/2017  .  TETANUS/TDAP  03/08/2024  . HIV Screening  Completed     Exam:  BP 115/79   Pulse 83   Wt 134 lb (60.8 kg)   LMP 08/16/2017   BMI 22.30 kg/m   Wt Readings from Last 5 Encounters:  08/29/17 134 lb (60.8 kg)  08/28/17 134 lb (60.8 kg)  06/09/17 141 lb (64 kg)  05/24/17 140 lb (63.5 kg)  08/26/16 146 lb (66.2 kg)    Gen: Well NAD HEENT: EOMI,  MMM Lungs: Normal work of breathing. CTABL Heart: RRR no MRG Abd: NABS, Soft. Nondistended, Nontender Exts: Brisk capillary refill, warm and well perfused.  Ankles bilaterally normal-appearing nontender normal motion   Results for orders placed or performed during the hospital encounter of 08/28/17 (from the past 72 hour(s))  POCT urine pregnancy     Status: None   Collection Time: 08/28/17  4:44 PM  Result Value Ref Range   Preg Test, Ur Negative Negative  POCT CBC w auto diff     Status: None   Collection Time: 08/28/17  4:53 PM  Result Value Ref Range   WBC  4.5 - 10.5 K/uL    Comment: see scanned report   Lymphocytes relative %  15 - 45 %   Monocytes relative %  2 - 10 %   Neutrophils relative % (GR)  44 - 76 %   Lymphocytes absolute  0.1 - 1.8 K/uL   Monocyes absolute  0.1 - 1 K/uL   Neutrophils absolute (GR#)  1.7 - 7.8 K/uL   RBC  3.8 - 5.1 MIL/uL   Hemoglobin  11.8 - 15.5 g/dL   Hematocrit  91.4 - 46 %   MCV  78 - 100 fL   MCH  26 - 32 pg   MCHC  32 - 36.5 g/dL   RDW  78.2 - 14 %   Platelet count  140 - 400 K/uL   MPV  7.8 - 11 fL  Comprehensive metabolic panel     Status: None   Collection Time: 08/28/17  4:53 PM  Result Value Ref Range   Glucose, Bld 87 65 - 99 mg/dL    Comment: .            Fasting reference interval .    BUN 10 7 - 25 mg/dL   Creat 9.56 2.13 - 0.86 mg/dL   BUN/Creatinine Ratio NOT APPLICABLE 6 - 22 (calc)   Sodium 137 135 - 146 mmol/L   Potassium 4.2 3.5 - 5.3 mmol/L   Chloride 105 98 - 110 mmol/L   CO2 26 20 - 32 mmol/L   Calcium 8.6 8.6 - 10.2 mg/dL   Total Protein 6.5 6.1 - 8.1  g/dL   Albumin 3.8 3.6 - 5.1 g/dL   Globulin 2.7 1.9 - 3.7 g/dL (calc)  AG Ratio 1.4 1.0 - 2.5 (calc)   Total Bilirubin 0.3 0.2 - 1.2 mg/dL   Alkaline phosphatase (APISO) 44 33 - 115 U/L   AST 18 10 - 30 U/L   ALT 18 6 - 29 U/L  POCT urinalysis dipstick (new)     Status: Abnormal   Collection Time: 08/28/17  6:32 PM  Result Value Ref Range   Color, UA yellow yellow   Clarity, UA clear clear   Glucose, UA negative negative mg/dL   Bilirubin, UA negative negative   Ketones, POC UA negative negative mg/dL   Spec Grav, UA <=4.098<=1.005 (A) 1.010 - 1.025   Blood, UA negative negative   pH, UA 7.0 5.0 - 8.0   Protein Ur, POC negative negative mg/dL   Urobilinogen, UA 0.2 0.2 or 1.0 E.U./dL   Nitrite, UA Negative Negative   Leukocytes, UA Negative Negative   No results found.    Assessment and Plan: 37 y.o. female with  Nausea with dry heaving.  Likely resolving viral gastroenteritis.  Patient is IBS and that could be flared up in response to recent viral gastroenteritis.  Plan for watchful waiting with continued treatment with Zofran or Phenergan.  Phenergan prescribed today.  Recheck in the near future.  We will follow-up pending amylase and lipase labs.  Ankle pain: Doing well with diclofenac gel.  Plan to continue diclofenac gel and recommend using compressive ankle sleeves as needed.  Recheck if not improving.  Health maintenance: We will obtain records from recent Pap smear [facility].   No orders of the defined types were placed in this encounter.  Meds ordered this encounter  Medications  . promethazine (PHENERGAN) 25 MG tablet    Sig: Take 1 tablet (25 mg total) by mouth every 6 (six) hours as needed for nausea or vomiting (or migraine).    Dispense:  30 tablet    Refill:  2     Discussed warning signs or symptoms. Please see discharge instructions. Patient expresses understanding.

## 2017-08-29 NOTE — Telephone Encounter (Signed)
Inquired about patient's status; encourage them to call with questions/concerns.  

## 2017-08-30 LAB — AMYLASE: Amylase: 67 U/L (ref 21–101)

## 2017-08-30 LAB — LIPASE: LIPASE: 56 U/L (ref 7–60)

## 2017-09-02 ENCOUNTER — Encounter: Payer: Self-pay | Admitting: Family Medicine

## 2017-09-02 NOTE — Progress Notes (Signed)
Negative for intraepithelial lesion or malignancy.  

## 2017-09-11 ENCOUNTER — Emergency Department (INDEPENDENT_AMBULATORY_CARE_PROVIDER_SITE_OTHER)
Admission: EM | Admit: 2017-09-11 | Discharge: 2017-09-11 | Disposition: A | Discharge status: Home / Self Care | Payer: 59 | Source: Home / Self Care | Attending: Emergency Medicine | Admitting: Emergency Medicine

## 2017-09-11 ENCOUNTER — Emergency Department (HOSPITAL_COMMUNITY)
Admission: EM | Admit: 2017-09-11 | Discharge: 2017-09-12 | Disposition: A | Discharge status: Home / Self Care | Payer: 59 | Attending: Emergency Medicine | Admitting: Emergency Medicine

## 2017-09-11 ENCOUNTER — Other Ambulatory Visit: Payer: Self-pay

## 2017-09-11 ENCOUNTER — Emergency Department (HOSPITAL_COMMUNITY): Payer: 59

## 2017-09-11 ENCOUNTER — Encounter (HOSPITAL_COMMUNITY): Payer: Self-pay | Admitting: Emergency Medicine

## 2017-09-11 ENCOUNTER — Encounter: Payer: Self-pay | Admitting: Emergency Medicine

## 2017-09-11 DIAGNOSIS — R200 Anesthesia of skin: Secondary | ICD-10-CM | POA: Insufficient documentation

## 2017-09-11 DIAGNOSIS — R51 Headache: Secondary | ICD-10-CM | POA: Diagnosis not present

## 2017-09-11 DIAGNOSIS — J4530 Mild persistent asthma, uncomplicated: Secondary | ICD-10-CM | POA: Diagnosis not present

## 2017-09-11 DIAGNOSIS — R42 Dizziness and giddiness: Secondary | ICD-10-CM

## 2017-09-11 DIAGNOSIS — R260 Ataxic gait: Secondary | ICD-10-CM

## 2017-09-11 DIAGNOSIS — Z79899 Other long term (current) drug therapy: Secondary | ICD-10-CM | POA: Diagnosis not present

## 2017-09-11 DIAGNOSIS — G43809 Other migraine, not intractable, without status migrainosus: Secondary | ICD-10-CM

## 2017-09-11 DIAGNOSIS — R519 Headache, unspecified: Secondary | ICD-10-CM

## 2017-09-11 LAB — DIFFERENTIAL
Basophils Absolute: 0.1 10*3/uL (ref 0.0–0.1)
Basophils Relative: 1 %
EOS PCT: 2 %
Eosinophils Absolute: 0.1 10*3/uL (ref 0.0–0.7)
LYMPHS ABS: 2.4 10*3/uL (ref 0.7–4.0)
LYMPHS PCT: 39 %
MONO ABS: 0.2 10*3/uL (ref 0.1–1.0)
Monocytes Relative: 3 %
NEUTROS ABS: 3.3 10*3/uL (ref 1.7–7.7)
NEUTROS PCT: 55 %

## 2017-09-11 LAB — COMPREHENSIVE METABOLIC PANEL
ALBUMIN: 3.9 g/dL (ref 3.5–5.0)
ALK PHOS: 34 U/L — AB (ref 38–126)
ALT: 17 U/L (ref 14–54)
AST: 21 U/L (ref 15–41)
Anion gap: 9 (ref 5–15)
BILIRUBIN TOTAL: 0.7 mg/dL (ref 0.3–1.2)
BUN: 12 mg/dL (ref 6–20)
CALCIUM: 9.2 mg/dL (ref 8.9–10.3)
CO2: 24 mmol/L (ref 22–32)
CREATININE: 0.9 mg/dL (ref 0.44–1.00)
Chloride: 104 mmol/L (ref 101–111)
GFR calc Af Amer: >60 mL/min (ref >60)
GFR calc non Af Amer: >60 mL/min (ref >60)
GLUCOSE: 104 mg/dL — AB (ref 65–99)
Potassium: 3.7 mmol/L (ref 3.5–5.1)
Sodium: 137 mmol/L (ref 135–145)
TOTAL PROTEIN: 7.2 g/dL (ref 6.5–8.1)

## 2017-09-11 LAB — I-STAT CHEM 8, ED
BUN: 13 mg/dL (ref 6–20)
CALCIUM ION: 1.19 mmol/L (ref 1.15–1.40)
CHLORIDE: 101 mmol/L (ref 101–111)
CREATININE: 0.9 mg/dL (ref 0.44–1.00)
GLUCOSE: 102 mg/dL — AB (ref 65–99)
HCT: 40 % (ref 36.0–46.0)
Hemoglobin: 13.6 g/dL (ref 12.0–15.0)
Potassium: 3.5 mmol/L (ref 3.5–5.1)
SODIUM: 140 mmol/L (ref 135–145)
TCO2: 26 mmol/L (ref 22–32)

## 2017-09-11 LAB — CBC
HCT: 40 % (ref 36.0–46.0)
HEMOGLOBIN: 13.3 g/dL (ref 12.0–15.0)
MCH: 28.7 pg (ref 26.0–34.0)
MCHC: 33.3 g/dL (ref 30.0–36.0)
MCV: 86.2 fL (ref 78.0–100.0)
Platelets: 288 10*3/uL (ref 150–400)
RBC: 4.64 MIL/uL (ref 3.87–5.11)
RDW: 12.6 % (ref 11.5–15.5)
WBC: 6.1 10*3/uL (ref 4.0–10.5)

## 2017-09-11 LAB — PROTIME-INR
INR: 1.05
Prothrombin Time: 13.7 seconds (ref 11.4–15.2)

## 2017-09-11 LAB — APTT: aPTT: 29 seconds (ref 24–36)

## 2017-09-11 LAB — I-STAT TROPONIN, ED: Troponin i, poc: 0 ng/mL (ref 0.00–0.08)

## 2017-09-11 LAB — I-STAT BETA HCG BLOOD, ED (MC, WL, AP ONLY): I-stat hCG, quantitative: <5 m[IU]/mL (ref <5)

## 2017-09-11 MED ORDER — PROMETHAZINE HCL 25 MG/ML IJ SOLN
25.0000 mg | Freq: Once | INTRAMUSCULAR | Status: AC
  Administered 2017-09-12: 25 mg via INTRAVENOUS
  Filled 2017-09-11: qty 1

## 2017-09-11 NOTE — ED Triage Notes (Signed)
[facility] LKW 1630. Pt had sudden onset of dizziness, and felt like her vision was "jumping" During triage pt noted to have sensory deficits to R side. Pt also reports HA. VAN negative. Pt does report being under a lot of stress with recent death of her father

## 2017-09-11 NOTE — Consult Note (Signed)
NEURO HOSPITALIST CONSULT NOTE   Requestig physician: Dr. Billy Fischer  Reason for Consult: Nystagmus preceding migraine headache  History obtained from:  Patient     HPI:                                                                                                                                          Kelsey [name] is an 37 y.o. female presenting with nystagmus beginning at 1630 along with vertigo. Symptoms of horizontal eye jerking and "jumping vision". This lasted 5 minutes and was followed by a 3/10 migraine headache which still persists. The other symptoms have resolved, but she complained of right sided sensory deficits. She has been under significant stress due to the recent death of her father and upcoming funeral in [place].   Past Medical History:  Diagnosis Date  . Anxiety and depression   . Asthma   . Depression   . Endometriosis   . Frequent headaches   . GERD (gastroesophageal reflux disease)   . Hx of varicella   . IBS (irritable bowel syndrome)   . Migraines   . Panic attacks   . Postpartum care following cesarean delivery (2/18) 07/08/2015  . Vertigo 12/31/2013    Past Surgical History:  Procedure Laterality Date  . CESAREAN SECTION N/A 07/08/2015   Procedure: CESAREAN SECTION;  Surgeon: Aloha Gell, MD;  Location: [facility];  Service: Obstetrics;  Laterality: N/A;  . NO PAST SURGERIES      Family History  Problem Relation Age of Onset  . Hypertension Mother   . Cancer Mother        multiple myeloma  . Hypertension Father   . Stroke Father   . Cancer Maternal Grandmother        CLL            Social History:  reports that she has never smoked. She has never used smokeless tobacco. She reports that she does not drink alcohol or use drugs.  Allergies  Allergen Reactions  . Catfish [Fish Allergy] Shortness Of Breath  . Fish-Derived Products Shortness Of Breath  . Pineapple Hives  . Citric Acid     Per patient report  . Tomato  Flavor  [Flavoring Agent]     Mouth sores    HOME MEDICATIONS:  ROS:                                                                                                                                       As per HPI.   Blood pressure 138/81, pulse 76, temperature 98.8 F (37.1 C), temperature source Oral, resp. rate 18, height '5\' 5"'  (1.651 m), weight 62.1 kg (137 lb), last menstrual period 08/16/2017, SpO2 100 %, currently breastfeeding.   General Examination:                                                                                                      HEENT-  [place]/AT    Lungs- Respirations unlabored Extremities- Warm, dry, no edema   Neurological Examination Mental Status: Alert, oriented, thought content appropriate.  Speech fluent without evidence of aphasia.  Able to follow all commands without difficulty. Anxious affect.  Cranial Nerves: II:  Visual fields intact all 4 quadrants OU. No extinction.   III,IV, VI: Ptosis not present, EOMI without nystagmus V,VII: Smile symmetric, facial temp sensation normal bilaterally VIII: hearing intact to conversation IX,X: Palate rises symmetrically XI: No asymmetry XII: midline tongue extension Motor: Right : Upper extremity   5/5    Left:     Upper extremity   5/5  Lower extremity   5/5     Lower extremity   5/5 Normal tone throughout; no atrophy noted Sensory: Temp and light touch intact throughout, bilaterally. No extinction.  Deep Tendon Reflexes: 3+ bilateral upper and lower extremities.  Plantars: Right: downgoing   Left: downgoing Cerebellar: Normal FNF bilaterally  Gait: Deferred   Lab Results: Basic Metabolic Panel: Recent Labs  Lab 09/11/17 2151  NA 140  K 3.5  CL 101  GLUCOSE 102*  BUN 13  CREATININE 0.90    CBC: Recent Labs  Lab 09/11/17 2151  HGB 13.6  HCT 40.0    Cardiac  Enzymes: No results for input(s): CKTOTAL, CKMB, CKMBINDEX, TROPONINI in the last 168 hours.  Lipid Panel: No results for input(s): CHOL, TRIG, HDL, CHOLHDL, VLDL, LDLCALC in the last 168 hours.  Imaging: Ct Head Code Stroke Wo Contrast  Result Date: 09/11/2017 CLINICAL DATA:  Code stroke. Right-sided weakness, blurred vision and dizziness. EXAM: CT HEAD WITHOUT CONTRAST TECHNIQUE: Contiguous axial images were obtained from the base of the skull through the vertex without intravenous contrast. COMPARISON:  None. FINDINGS: Brain: No mass lesion or acute hemorrhage. No focal hypoattenuation of the basal ganglia or cortex to indicate infarcted  tissue. No hydrocephalus or age advanced atrophy. Vascular: No hyperdense vessel. No advanced atherosclerotic calcification of the arteries at the skull base. Skull: Normal visualized skull base, calvarium and extracranial soft tissues. Sinuses/Orbits: No sinus fluid levels or advanced mucosal thickening. No mastoid effusion. Normal orbits. ASPECTS [facility] Stroke Program Early CT Score) - Ganglionic level infarction (caudate, lentiform nuclei, internal capsule, insula, M1-M3 cortex): 7 - Supraganglionic infarction (M4-M6 cortex): 3 Total score (0-10 with 10 being normal): 10 IMPRESSION: 1. Normal head CT. 2. ASPECTS is 10. These results were communicated to Dr. Kerney Elbe at 9:40 pm on 09/11/2017 by text page via the [facility] messaging system. Electronically Signed   By: Ulyses Jarred M.D.   On: 09/11/2017 21:41   Assessment: Presenting symptoms most consistent with basilar migraine versus stress related response to recent death of her father. Neurological symptoms resolved with residual 3/10 headache 1. Neurological exam negative. No nystagmus or cerebellar signs.  2. CT head normal.  3. No stroke risk factors  Recommendations: 1. Could consider MRI/MRA brain, but likely to be low yield 2. Phenergan 25 mg IV x 1  Electronically signed: Dr. Kerney Elbe 09/11/2017, 10:05 PM

## 2017-09-11 NOTE — ED Triage Notes (Signed)
Patient reports under severe stress this week with recent death of father. At 1635 today she noticed her visual field was jumpy and she was dizzy; she has pressure in back of head. Smiling, talking and seems calm.

## 2017-09-11 NOTE — ED Provider Notes (Signed)
There is no pain [name]    CSN: [id number] Arrival date & time: 09/11/17  1833     History   Chief Complaint Chief Complaint  Patient presents with  . Dizziness  . Visual Field Change    HPI Cailen Texeira is a 37 y.o. female.  This is a complicated situation.  This afternoon patient had an episode lasting about 5 minutes of severe nystagmus.  This was associated with dizziness but no vomiting.  Since that time she has been unstable with gait.  She is very unsteady.  She has not had any vomiting. HPI  Past Medical History:  Diagnosis Date  . Anxiety and depression   . Asthma   . Depression   . Endometriosis   . Frequent headaches   . GERD (gastroesophageal reflux disease)   . Hx of varicella   . IBS (irritable bowel syndrome)   . Migraines   . Panic attacks   . Postpartum care following cesarean delivery (2/18) 07/08/2015  . Vertigo 12/31/2013    Patient Active Problem List   Diagnosis Date Noted  . 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 Arthralgia 06/09/2017  . Abnormal laboratory test 09/07/2014  . Asthma, mild persistent 03/08/2014  . GERD (gastroesophageal reflux disease) 03/08/2014  . Seasonal allergies 03/08/2014  . IBS (irritable bowel syndrome) 03/08/2014  . Anxiety and depression 03/08/2014  . Chronic daily headache 12/31/2013  . Vertigo 12/31/2013  . Postconcussive syndrome 12/31/2013    Past Surgical History:  Procedure Laterality Date  . CESAREAN SECTION N/A 07/08/2015   Procedure: CESAREAN SECTION;  Surgeon: Aloha Gell, MD;  Location: [facility];  Service: Obstetrics;  Laterality: N/A;  . NO PAST SURGERIES      OB History    Gravida  1   Para  1   Term  1   Preterm      AB      Living  1     SAB      TAB      Ectopic      Multiple  0   Live Births  1            Home Medications    Prior to Admission medications   Medication Sig Start Date End Date Taking? Authorizing Provider  albuterol (PROVENTIL  HFA;VENTOLIN HFA) 108 (90 Base) MCG/ACT inhaler INHALE 2 PUFFS INTO THE LUNGS EVERY 4 (FOUR) HOURS AS NEEDED FOR WHEEZING. 07/02/17   Gregor Hams, MD  amitriptyline (ELAVIL) 25 MG tablet Take 0.5 tablets (12.5 mg total) by mouth at bedtime. 07/11/17   Gregor Hams, MD  BIOTIN PO Take by mouth.    [provider]  diclofenac sodium (VOLTAREN) 1 % GEL Apply 4 g topically 4 (four) times daily. To affected joint. 06/09/17   Gregor Hams, MD  fluticasone (FLONASE) 50 MCG/ACT nasal spray 2 SPRAYS BY EACH NARE ROUTE DAILY. 04/08/17   Gregor Hams, MD  MAGNESIUM PO Take by mouth.    [provider]  norethindrone (MICRONOR,CAMILA,ERRIN) 0.35 MG tablet Take by mouth.    [provider]  ondansetron (ZOFRAN-ODT) 8 MG disintegrating tablet 1 tablet beneath your tongue every 8 hours. 08/28/17   Darlyne Russian, MD  pantoprazole (PROTONIX) 40 MG tablet Take 1 tablet (40 mg total) by mouth daily. APPOINTMENT NEEDED FOR FURTHER REFILLS 08/06/17   Gregor Hams, MD  Prenatal Vit-Fe Fumarate-FA (PRENATAL MULTIVITAMIN) TABS tablet Take 1 tablet by mouth daily at 12 noon.  [provider]  promethazine (PHENERGAN) 25 MG tablet Take 1 tablet (25 mg total) by mouth every 6 (six) hours as needed for nausea or vomiting (or migraine). 08/29/17   Gregor Hams, MD  Riboflavin 400 MG TABS Take by mouth.    [provider]  rizatriptan (MAXALT-MLT) 10 MG disintegrating tablet Take  1 tablet at the onset of your headache, repeat a dose in 2 hours if you need more relief. Do not use more than 3 days a week. 08/26/16   Gregor Hams, MD    Family History Family History  Problem Relation Age of Onset  . Hypertension Mother   . Cancer Mother        multiple myeloma  . Hypertension Father   . Stroke Father   . Cancer Maternal Grandmother        CLL    Social History Social History   Tobacco Use  . Smoking status: Never Smoker  . Smokeless tobacco: Never Used  Substance Use  Topics  . Alcohol use: No    Alcohol/week: 0.0 oz  . Drug use: No     Allergies   Catfish [fish allergy]; Fish-derived products; Pineapple; Citric acid; and Tomato flavor  [flavoring agent]   Review of Systems Review of Systems  Constitutional: Negative.   HENT: Negative.   Respiratory: Negative.   Cardiovascular: Negative.   Neurological: Positive for dizziness. Negative for seizures, syncope, speech difficulty, weakness, light-headedness, numbness and headaches.       She feels unsteady with gait.  She has to hold onto the wall to walk.  She has not had any true vertigo since her episode which lasted about 5 minutes.     Physical Exam Triage Vital Signs ED Triage Vitals  Enc Vitals Group     BP 09/11/17 1922 126/86     Pulse Rate 09/11/17 1922 72     Resp 09/11/17 1922 18     Temp 09/11/17 1922 98.4 F (36.9 C)     Temp Source 09/11/17 1922 Oral     SpO2 09/11/17 1922 100 %     Weight 09/11/17 1923 137 lb (62.1 kg)     Height 09/11/17 1923 '5\' 5"'  (1.651 m)     Head Circumference --      Peak Flow --      Pain Score 09/11/17 1922 0     Pain Loc --      Pain Edu? --      Excl. in [place]? --    No data found.  Updated Vital Signs BP 126/86 (BP Location: Right Arm)   Pulse 72   Temp 98.4 F (36.9 C) (Oral)   Resp 18   Ht '5\' 5"'  (1.651 m)   Wt 137 lb (62.1 kg)   LMP 08/16/2017   SpO2 100%   BMI 22.80 kg/m   Visual Acuity Right Eye Distance: 20/25 Left Eye Distance: 20/20 Bilateral Distance: with glasses  Right Eye Near:   Left Eye Near:    Bilateral Near:     Physical Exam  Constitutional: She is oriented to person, place, and time. She appears well-developed and well-nourished.  Eyes: Pupils are equal, round, and reactive to light. EOM are normal.  Neck: Normal range of motion.  Cardiovascular: Normal rate and normal heart sounds.  Pulmonary/Chest: Effort normal and breath sounds normal.  Neurological: She is alert and oriented to person, place, and  time. She displays normal reflexes. No cranial nerve deficit or sensory deficit.  She exhibits normal muscle tone. Coordination abnormal.  Skin: Skin is warm and dry.  [place]berg was positive.  She was unable to tandem walk.  She was very unsteady with ambulation.   UC Treatments / Results  Labs (all labs ordered are listed, but only abnormal results are displayed) Labs Reviewed - No data to display  EKG None Radiology No results found.  Procedures Procedures (including critical care time)  Medications Ordered in UC Medications - No data to display   Initial Impression / Assessment and Plan / UC Course  I have reviewed the triage vital signs and the nursing notes.  Pertinent labs & imaging results that were available during my care of the patient were reviewed by me and considered in my medical decision making (see chart for details).   I was very concerned at what was going on with this patient.  She certainly could have had a vertebrobasilar event.  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  She was advised to go to the emergency room at [facility] for further evaluation.  The case is complicated also by the recent death of her father.  Please go to the hospital for further evaluation. I am very concerned about your unsteady gait since your episode. You need to be evaluated to rule out a possible posterior circulation stroke.  Final Clinical Impressions(s) / UC Diagnoses   Final diagnoses:  Dizziness  Ataxic gait    ED Discharge Orders    None       Controlled Substance Prescriptions [place] Controlled Substance Registry consulted? Not Applicable state this is physician   Darlyne Russian, MD 09/11/17 2151

## 2017-09-11 NOTE — Discharge Instructions (Addendum)
Please go to the hospital for further evaluation. I am very concerned about your unsteady gait since your episode. You need to be evaluated to rule out a possible posterior circulation stroke.

## 2017-09-12 ENCOUNTER — Emergency Department (HOSPITAL_COMMUNITY): Payer: 59

## 2017-09-12 MED ORDER — SODIUM CHLORIDE 0.9 % IV BOLUS
1000.0000 mL | Freq: Once | INTRAVENOUS | Status: AC
  Administered 2017-09-12: 1000 mL via INTRAVENOUS

## 2017-09-12 MED ORDER — MECLIZINE HCL 25 MG PO TABS
25.0000 mg | ORAL_TABLET | Freq: Once | ORAL | Status: AC
  Administered 2017-09-12: 25 mg via ORAL
  Filled 2017-09-12: qty 1

## 2017-09-12 NOTE — ED Notes (Signed)
This RN attempted IV stick x2, EDP to place IV with ultrasound

## 2017-09-12 NOTE — ED Provider Notes (Signed)
[facility] EMERGENCY DEPARTMENT Provider Note   CSN: [id number] Arrival date & time: 09/11/17  2113     History   Chief Complaint Chief Complaint  Patient presents with  . Code Stroke    HPI Kelsey [name] is a 37 y.o. female.  HPI  37 year old female with a history of anxiety, depression, migraines presents with mild concern for dizziness and left-sided numbness.  Patient was in normal state of health and at 4:30 PM, developed sudden visual motions/nystagmus, as well as sensation of room spinning.  Reports she had associated numbness of the left side.  Denies focal weakness, speech difficulties.  Reports she had associated blurred vision and reports her vision was moving with the dizziness and nystagmus, but denies visual field deficit or double vision.  She had a headache that was rated 3 out of 10, dull, throbbing at the back of the head.  Reports he has a history of migraines, but the headaches are usually more severe and is accompanied by nausea and vomiting.  Hx of vertigo/vestibular neuritis in the past but this is different, no hx of nystagmus, feels more off balance and numbness is new.  Right side with sensory defs per triage however patient reports she had numbness on the left side and that's where it was before.   Past Medical History:  Diagnosis Date  . Anxiety and depression   . Asthma   . Depression   . Endometriosis   . Frequent headaches   . GERD (gastroesophageal reflux disease)   . Hx of varicella   . IBS (irritable bowel syndrome)   . Migraines   . Panic attacks   . Postpartum care following cesarean delivery (2/18) 07/08/2015  . Vertigo 12/31/2013    Patient Active Problem List   Diagnosis Date Noted  . 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 Arthralgia 06/09/2017  . Abnormal laboratory test 09/07/2014  . Asthma, mild persistent 03/08/2014  . GERD (gastroesophageal reflux disease) 03/08/2014  . Seasonal allergies 03/08/2014  . IBS  (irritable bowel syndrome) 03/08/2014  . Anxiety and depression 03/08/2014  . Chronic daily headache 12/31/2013  . Vertigo 12/31/2013  . Postconcussive syndrome 12/31/2013    Past Surgical History:  Procedure Laterality Date  . CESAREAN SECTION N/A 07/08/2015   Procedure: CESAREAN SECTION;  Surgeon: Aloha Gell, MD;  Location: [facility];  Service: Obstetrics;  Laterality: N/A;  . NO PAST SURGERIES       OB History    Gravida  1   Para  1   Term  1   Preterm      AB      Living  1     SAB      TAB      Ectopic      Multiple  0   Live Births  1            Home Medications    Prior to Admission medications   Medication Sig Start Date End Date Taking? Authorizing Provider  albuterol (PROVENTIL HFA;VENTOLIN HFA) 108 (90 Base) MCG/ACT inhaler INHALE 2 PUFFS INTO THE LUNGS EVERY 4 (FOUR) HOURS AS NEEDED FOR WHEEZING. 07/02/17   Gregor Hams, MD  amitriptyline (ELAVIL) 25 MG tablet Take 0.5 tablets (12.5 mg total) by mouth at bedtime. 07/11/17   Gregor Hams, MD  BIOTIN PO Take by mouth.    [provider]  diclofenac sodium (VOLTAREN) 1 % GEL Apply 4 g topically 4 (four) times daily. To affected joint.  06/09/17   Gregor Hams, MD  fluticasone (FLONASE) 50 MCG/ACT nasal spray 2 SPRAYS BY EACH NARE ROUTE DAILY. 04/08/17   Gregor Hams, MD  MAGNESIUM PO Take by mouth.    [provider]  norethindrone (MICRONOR,CAMILA,ERRIN) 0.35 MG tablet Take by mouth.    [provider]  ondansetron (ZOFRAN-ODT) 8 MG disintegrating tablet 1 tablet beneath your tongue every 8 hours. 08/28/17   Darlyne Russian, MD  pantoprazole (PROTONIX) 40 MG tablet Take 1 tablet (40 mg total) by mouth daily. APPOINTMENT NEEDED FOR FURTHER REFILLS 08/06/17   Gregor Hams, MD  Prenatal Vit-Fe Fumarate-FA (PRENATAL MULTIVITAMIN) TABS tablet Take 1 tablet by mouth daily at 12 noon.    [provider]  promethazine (PHENERGAN) 25 MG tablet Take 1 tablet (25 mg total) by  mouth every 6 (six) hours as needed for nausea or vomiting (or migraine). 08/29/17   Gregor Hams, MD  Riboflavin 400 MG TABS Take by mouth.    [provider]  rizatriptan (MAXALT-MLT) 10 MG disintegrating tablet Take  1 tablet at the onset of your headache, repeat a dose in 2 hours if you need more relief. Do not use more than 3 days a week. 08/26/16   Gregor Hams, MD    Family History Family History  Problem Relation Age of Onset  . Hypertension Mother   . Cancer Mother        multiple myeloma  . Hypertension Father   . Stroke Father   . Cancer Maternal Grandmother        CLL    Social History Social History   Tobacco Use  . Smoking status: Never Smoker  . Smokeless tobacco: Never Used  Substance Use Topics  . Alcohol use: No    Alcohol/week: 0.0 oz  . Drug use: No     Allergies   Catfish [fish allergy]; Fish-derived products; Pineapple; Citric acid; and Tomato flavor  [flavoring agent]   Review of Systems Review of Systems  Constitutional: Negative for fever.  HENT: Negative for sore throat.   Eyes: Negative for visual disturbance.  Respiratory: Negative for cough and shortness of breath.   Cardiovascular: Negative for chest pain.  Gastrointestinal: Negative for abdominal pain, nausea and vomiting.  Genitourinary: Negative for difficulty urinating.  Musculoskeletal: Negative for back pain and neck pain.  Skin: Negative for rash.  Neurological: Positive for dizziness, numbness and headaches. Negative for tremors, seizures, syncope, facial asymmetry, speech difficulty and weakness.     Physical Exam Updated Vital Signs BP 138/81 (BP Location: Left Arm)   Pulse 76   Temp 98.8 F (37.1 C) (Oral)   Resp 18   Ht '5\' 5"'  (1.651 m)   Wt 62.1 kg (137 lb)   LMP 08/16/2017   SpO2 100%   BMI 22.80 kg/m   Physical Exam  Constitutional: She is oriented to person, place, and time. She appears well-developed and well-nourished. No distress.  HENT:  Head:  Normocephalic and atraumatic.  Eyes: Conjunctivae and EOM are normal.  Neck: Normal range of motion.  Cardiovascular: Normal rate, regular rhythm, normal heart sounds and intact distal pulses. Exam reveals no gallop and no friction rub.  No murmur heard. Pulmonary/Chest: Effort normal and breath sounds normal. No respiratory distress. She has no wheezes. She has no rales.  Abdominal: Soft. She exhibits no distension. There is no tenderness. There is no guarding.  Musculoskeletal: She exhibits no edema or tenderness.  Neurological: She is alert and oriented  to person, place, and time. She has normal strength. A sensory deficit (left sided) is present. No cranial nerve deficit. Coordination normal. GCS eye subscore is 4. GCS verbal subscore is 5. GCS motor subscore is 6.  Skin: Skin is warm and dry. No rash noted. She is not diaphoretic. No erythema.  Nursing note and vitals reviewed.    ED Treatments / Results  Labs (all labs ordered are listed, but only abnormal results are displayed) Labs Reviewed  COMPREHENSIVE METABOLIC PANEL - Abnormal; Notable for the following components:      Result Value   Glucose, Bld 104 (*)    Alkaline Phosphatase 34 (*)    All other components within normal limits  I-STAT CHEM 8, ED - Abnormal; Notable for the following components:   Glucose, Bld 102 (*)    All other components within normal limits  PROTIME-INR  APTT  CBC  DIFFERENTIAL  I-STAT TROPONIN, ED  I-STAT BETA HCG BLOOD, ED ([facility], [facility], [facility] ONLY)  CBG MONITORING, ED    EKG None  Radiology Ct Head Code Stroke Wo Contrast  Result Date: 09/11/2017 CLINICAL DATA:  Code stroke. Right-sided weakness, blurred vision and dizziness. EXAM: CT HEAD WITHOUT CONTRAST TECHNIQUE: Contiguous axial images were obtained from the base of the skull through the vertex without intravenous contrast. COMPARISON:  None. FINDINGS: Brain: No mass lesion or acute hemorrhage. No focal hypoattenuation of the basal ganglia  or cortex to indicate infarcted tissue. No hydrocephalus or age advanced atrophy. Vascular: No hyperdense vessel. No advanced atherosclerotic calcification of the arteries at the skull base. Skull: Normal visualized skull base, calvarium and extracranial soft tissues. Sinuses/Orbits: No sinus fluid levels or advanced mucosal thickening. No mastoid effusion. Normal orbits. ASPECTS [facility] Stroke Program Early CT Score) - Ganglionic level infarction (caudate, lentiform nuclei, internal capsule, insula, M1-M3 cortex): 7 - Supraganglionic infarction (M4-M6 cortex): 3 Total score (0-10 with 10 being normal): 10 IMPRESSION: 1. Normal head CT. 2. ASPECTS is 10. These results were communicated to Dr. Kerney Elbe at 9:40 pm on 09/11/2017 by text page via the [facility] messaging system. Electronically Signed   By: Ulyses Jarred M.D.   On: 09/11/2017 21:41    Procedures Procedures (including critical care time)  Medications Ordered in ED Medications  promethazine (PHENERGAN) injection 25 mg (has no administration in time range)     Initial Impression / Assessment and Plan / ED Course  I have reviewed the triage vital signs and the nursing notes.  Pertinent labs & imaging results that were available during my care of the patient were reviewed by me and considered in my medical decision making (see chart for details).      37 year old female with a history of anxiety, depression, migraines presents with mild concern for dizziness and left-sided numbness.  Patient arrived as a code stroke, neurology evaluated her.  CT showed no acute findings.  Labs are within normal limits.  Felt to be more likely complicated migraine or stress related symptoms then acute CVA or TIA, and Phenergan IV was recommended for headache relief.    Patient with persistent numbness.  Ordered MRI for stroke reevaluation. If negative, she may be discharged home with further care.   Final Clinical Impressions(s) / ED Diagnoses   Final  diagnoses:  Left sided numbness  Dizziness  Acute nonintractable headache, unspecified headache type    ED Discharge Orders    None       Gareth Morgan, MD 09/12/17 [phone]

## 2017-09-12 NOTE — ED Notes (Signed)
Patient transported to MRI 

## 2017-09-12 NOTE — Discharge Instructions (Addendum)

## 2017-09-12 NOTE — ED Provider Notes (Signed)
She has now improved.  I personally ambulated the patient and she did well.  She feels appropriate for discharge.  We discussed strict return precautions. Referred to neurology   Zadie RhineWickline, Sharalyn Lomba, MD 09/12/17 [phone]

## 2017-09-12 NOTE — ED Notes (Signed)
Pt ambulated in hallway without difficulty

## 2017-09-12 NOTE — ED Provider Notes (Signed)
She was signed out to me to follow-up on MRI.  MRI is negative.  She reports she feels worse since the Phenergan.  When she ambulated she reports feeling dizzy and had to grab onto the bed.  She is now reporting vertigo.  She also reports feeling restless.  IV fluids and meclizine ordered   Zadie RhineWickline, Sam Overbeck, MD 09/12/17 [phone]

## 2017-09-12 NOTE — ED Notes (Signed)
Pt.is still in mri

## 2017-09-12 NOTE — ED Notes (Signed)
Pt.has return from MRI

## 2017-09-17 ENCOUNTER — Encounter: Payer: Self-pay | Admitting: Family Medicine

## 2017-10-04 ENCOUNTER — Other Ambulatory Visit: Payer: Self-pay | Admitting: Family Medicine

## 2017-10-08 ENCOUNTER — Encounter: Payer: Self-pay | Admitting: Family Medicine

## 2017-11-03 ENCOUNTER — Other Ambulatory Visit: Payer: Self-pay | Admitting: Family Medicine

## 2017-11-19 ENCOUNTER — Ambulatory Visit (INDEPENDENT_AMBULATORY_CARE_PROVIDER_SITE_OTHER): Payer: 59 | Admitting: Family Medicine

## 2017-11-19 ENCOUNTER — Encounter: Payer: Self-pay | Admitting: Family Medicine

## 2017-11-19 VITALS — BP 117/81 | HR 92 | Wt 139.0 lb

## 2017-11-19 DIAGNOSIS — R5383 Other fatigue: Secondary | ICD-10-CM

## 2017-11-19 DIAGNOSIS — R531 Weakness: Secondary | ICD-10-CM

## 2017-11-19 DIAGNOSIS — G4489 Other headache syndrome: Secondary | ICD-10-CM

## 2017-11-19 MED ORDER — TOPIRAMATE 100 MG PO TABS
ORAL_TABLET | ORAL | 1 refills | Status: DC
Start: 2017-11-19 — End: 2018-12-31

## 2017-11-19 NOTE — Patient Instructions (Signed)
Thank you for coming in today. You should hear from Neurology soon.  Make an appointment if you can.   You should also recheck with ophthalmology soon.   Restart Topamax.   Recheck with me as needed.   I think this is likely due to complex migraine.

## 2017-11-19 NOTE — Progress Notes (Signed)
Kelsey [name] is a 37 y.o. female who presents to [name] today for headaches and neurologic abnormalities.  Kelsey [name] has a history of postconcussive syndrome previously evaluated by neurology [place] as well as headache disorder evaluated by a neurologist in [place].  She is doing pretty well with mild intermittent occasional headaches until recently.  In late April she developed weakness and numbness and nystagmus.  She was seen in the emergency room and had a normal brain CT scan and MRI.  She was thought to be having complex migraines.  She is been having intermittent migraines and notes some mild right-sided blurry vision at times.  She feels well currently and notes that in the past she took Topamax for migraine prophylaxis which seemed to help.  She stopped it to have a baby but did not restart it after her child was born.  She is not currently breast-feeding and is interested in restarting Topamax if possible.  She denies any significant numbness or discoordination or weakness currently.  Additionally she does note fatigue as well.  This is been ongoing for a few months as well.  She describes feeling tired and vague muscle weakness diffusely throughout body.  She denies any focal weakness.  She notes that she is sleeping pretty well and does not snore.  She is never been told that she stops breathing at night.   ROS as above:  Exam:  BP 117/81   Pulse 92   Wt 139 lb (63 kg)   BMI 23.13 kg/m  Gen: Well NAD HEENT: EOMI,  MMM retinas visible bilaterally.  The full retina was not well visualized today due to eye motion. Lungs: Normal work of breathing. CTABL Heart: RRR no MRG Abd: NABS, Soft. Nondistended, Nontender Exts: Brisk capillary refill, warm and well perfused.  Neuro alert and oriented normal coordination balance gait strength and sensation.  Lab and  Radiology Results  EXAM: MRI HEAD WITHOUT CONTRAST  TECHNIQUE: Multiplanar, multiecho pulse sequences of the brain and surrounding structures were obtained without intravenous contrast.  COMPARISON:  CT Head August 11, 2017  FINDINGS: INTRACRANIAL CONTENTS: No reduced diffusion to suggest acute ischemia or hyperacute demyelination. No susceptibility artifact to suggest hemorrhage. The ventricles and sulci are normal for patient's age. No suspicious parenchymal signal, masses, mass effect. No abnormal extra-axial fluid collections. No extra-axial masses.  VASCULAR: Normal major intracranial vascular flow voids present at skull base.  SKULL AND UPPER CERVICAL SPINE: No abnormal sellar expansion. No suspicious calvarial bone marrow signal. Craniocervical junction maintained.  SINUSES/ORBITS: Trace paranasal sinus mucosal thickening without air-fluid levels. Mastoid air cells are well aerated.The included ocular globes and orbital contents are non-suspicious.  OTHER: None.  IMPRESSION: Normal noncontrast MRI head.   Electronically Signed   By: Awilda Metro M.D.   On: 09/12/2017 02:21 I personally (independently) visualized and performed the interpretation of the images attached in this note.    Assessment and Plan: 37 y.o. female with  Neurological abnormalities.  This is concerning for complex migraine.  Differential includes MS or partial seizures.  However she had a normal brain MRI therefore MS is quite unlikely.  Additionally she does not have any structural eyebrows or brain to explain partial seizures.  She does have a headache disorder and did well on Topamax previously for headache prophylaxis.  I think is reasonable to restart Topamax prophylaxis.  Additionally I think is reasonable to follow back up with neurology.  Additionally patient notes fatigue.  I think is reasonable to see with a basic metabolic work-up listed below.  If not revealing my  next thought would be evaluate for a sleep disturbance with a possible sleep study or sleep latency test.   Orders Placed This Encounter  Procedures  . CBC  . COMPLETE METABOLIC PANEL WITH GFR  . TSH  . Fe+TIBC+Fer  . CK  . Sedimentation rate  . Ambulatory referral to Neurology    Referral Priority:   Routine    Referral Type:   Consultation    Referral Reason:   Specialty Services Required    Referred to Provider:   Van ClinesAquino, Karen M, MD    Requested Specialty:   Neurology    Number of Visits Requested:   1   Meds ordered this encounter  Medications  . topiramate (TOPAMAX) 100 MG tablet    Sig: One half tab by mouth daily for a week, then one tab by mouth daily.    Dispense:  90 tablet    Refill:  1     Historical information moved to improve visibility of documentation.  Past Medical History:  Diagnosis Date  . Anxiety and depression   . Asthma   . Depression   . Endometriosis   . Frequent headaches   . GERD (gastroesophageal reflux disease)   . Hx of varicella   . IBS (irritable bowel syndrome)   . Migraines   . Panic attacks   . Postpartum care following cesarean delivery (2/18) 07/08/2015  . Vertigo 12/31/2013   Past Surgical History:  Procedure Laterality Date  . CESAREAN SECTION N/A 07/08/2015   Procedure: CESAREAN SECTION;  Surgeon: Noland FordyceKelly Fogleman, MD;  Location: [facility];  Service: Obstetrics;  Laterality: N/A;  . NO PAST SURGERIES     Social History   Tobacco Use  . Smoking status: Never Smoker  . Smokeless tobacco: Never Used  Substance Use Topics  . Alcohol use: No    Alcohol/week: 0.0 oz   family history includes Cancer in her maternal grandmother and mother; Hypertension in her father and mother; Stroke in her father.  Medications: Current Outpatient Medications  Medication Sig Dispense Refill  . albuterol (PROVENTIL HFA;VENTOLIN HFA) 108 (90 Base) MCG/ACT inhaler INHALE 2 PUFFS INTO THE LUNGS EVERY 4 (FOUR) HOURS AS NEEDED FOR WHEEZING. 6.7  Inhaler 1  . amitriptyline (ELAVIL) 25 MG tablet Take 0.5 tablets (12.5 mg total) by mouth at bedtime. 45 tablet 3  . BIOTIN PO Take by mouth.    . diclofenac sodium (VOLTAREN) 1 % GEL Apply 4 g topically 4 (four) times daily. To affected joint. 100 g 11  . fluticasone (FLONASE) 50 MCG/ACT nasal spray 2 SPRAYS BY EACH NARE ROUTE DAILY. 16 g 6  . norethindrone (MICRONOR,CAMILA,ERRIN) 0.35 MG tablet Take by mouth.    . pantoprazole (PROTONIX) 40 MG tablet TAKE 1 TABLET (40 MG TOTAL) BY MOUTH DAILY. APPOINTMENT NEEDED FOR FURTHER REFILLS 30 tablet 0  . Prenatal Vit-Fe Fumarate-FA (PRENATAL MULTIVITAMIN) TABS tablet Take 1 tablet by mouth daily at 12 noon.    . Riboflavin 400 MG TABS Take by mouth.    . rizatriptan (MAXALT-MLT) 10 MG disintegrating tablet Take  1 tablet at the onset of your headache, repeat a dose in 2 hours if you need more relief. Do not use more than 3 days a week. 10 tablet 12  . topiramate (TOPAMAX) 100 MG tablet One half tab by mouth daily for a week, then one tab  by mouth daily. 90 tablet 1   No current facility-administered medications for this visit.    Allergies  Allergen Reactions  . Catfish [Fish Allergy] Shortness Of Breath  . Fish-Derived Products Shortness Of Breath  . Pineapple Hives  . Citric Acid     Per patient report  . Tomato Flavor  [Flavoring Agent]     Mouth sores  . Phenergan [Promethazine Hcl] Other (See Comments)    Restless motion     Discussed warning signs or symptoms. Please see discharge instructions. Patient expresses understanding.

## 2017-11-20 LAB — CK: Total CK: 101 U/L (ref 29–143)

## 2017-11-20 LAB — COMPLETE METABOLIC PANEL WITH GFR
AG RATIO: 1.4 (calc) (ref 1.0–2.5)
ALT: 10 U/L (ref 6–29)
AST: 16 U/L (ref 10–30)
Albumin: 4.1 g/dL (ref 3.6–5.1)
Alkaline phosphatase (APISO): 37 U/L (ref 33–115)
BUN: 11 mg/dL (ref 7–25)
CHLORIDE: 104 mmol/L (ref 98–110)
CO2: 29 mmol/L (ref 20–32)
Calcium: 9.2 mg/dL (ref 8.6–10.2)
Creat: 0.88 mg/dL (ref 0.50–1.10)
GFR, EST NON AFRICAN AMERICAN: 86 mL/min/{1.73_m2} (ref >=60)
GFR, Est African American: 99 mL/min/{1.73_m2} (ref >=60)
GLUCOSE: 93 mg/dL (ref 65–99)
Globulin: 2.9 g/dL (calc) (ref 1.9–3.7)
POTASSIUM: 4.1 mmol/L (ref 3.5–5.3)
Sodium: 138 mmol/L (ref 135–146)
Total Bilirubin: 0.6 mg/dL (ref 0.2–1.2)
Total Protein: 7 g/dL (ref 6.1–8.1)

## 2017-11-20 LAB — CBC
HCT: 42.2 % (ref 35.0–45.0)
Hemoglobin: 14 g/dL (ref 11.7–15.5)
MCH: 28.9 pg (ref 27.0–33.0)
MCHC: 33.2 g/dL (ref 32.0–36.0)
MCV: 87.2 fL (ref 80.0–100.0)
MPV: 10.5 fL (ref 7.5–12.5)
PLATELETS: 255 10*3/uL (ref 140–400)
RBC: 4.84 10*6/uL (ref 3.80–5.10)
RDW: 12.3 % (ref 11.0–15.0)
WBC: 3.4 10*3/uL — AB (ref 3.8–10.8)

## 2017-11-20 LAB — TSH: TSH: 1.04 mIU/L

## 2017-11-20 LAB — SEDIMENTATION RATE: Sed Rate: 6 mm/h (ref 0–20)

## 2017-11-20 LAB — IRON,TIBC AND FERRITIN PANEL
%SAT: 30 % (ref 16–45)
Ferritin: 51 ng/mL (ref 16–154)
IRON: 95 ug/dL (ref 40–190)
TIBC: 321 ug/dL (ref 250–450)

## 2017-11-24 ENCOUNTER — Encounter: Payer: Self-pay | Admitting: Family Medicine

## 2017-12-01 ENCOUNTER — Encounter: Payer: Self-pay | Admitting: Neurology

## 2017-12-11 ENCOUNTER — Encounter: Payer: Self-pay | Admitting: Family Medicine

## 2017-12-11 ENCOUNTER — Other Ambulatory Visit: Payer: Self-pay | Admitting: Family Medicine

## 2017-12-11 MED ORDER — PANTOPRAZOLE SODIUM 40 MG PO TBEC: 40.0000 mg | DELAYED_RELEASE_TABLET | Freq: Every day | ORAL | 3 refills | Status: DC

## 2018-02-02 ENCOUNTER — Other Ambulatory Visit: Payer: Self-pay

## 2018-02-02 MED ORDER — FLUTICASONE PROPIONATE 50 MCG/ACT NA SUSP: NASAL | 6 refills | Status: DC

## 2018-02-03 ENCOUNTER — Ambulatory Visit: Payer: 59 | Admitting: Neurology

## 2018-03-10 ENCOUNTER — Telehealth: Payer: Self-pay | Admitting: Family Medicine

## 2018-03-10 NOTE — Telephone Encounter (Signed)
Received H&P form from [facility].

## 2018-03-13 ENCOUNTER — Other Ambulatory Visit: Payer: Self-pay | Admitting: Family Medicine

## 2018-03-20 ENCOUNTER — Encounter: Payer: Self-pay | Admitting: Neurology

## 2018-03-20 ENCOUNTER — Ambulatory Visit (INDEPENDENT_AMBULATORY_CARE_PROVIDER_SITE_OTHER): Payer: 59 | Admitting: Neurology

## 2018-03-20 VITALS — BP 110/68 | HR 95 | Ht 65.0 in | Wt 142.0 lb

## 2018-03-20 DIAGNOSIS — G43109 Migraine with aura, not intractable, without status migrainosus: Secondary | ICD-10-CM

## 2018-03-20 NOTE — Progress Notes (Signed)
NEUROLOGY CONSULTATION NOTE  Kelsey [name] MRN: [id number] DOB: [birth year]  Referring provider: Dr. Lynne Leader Primary care provider: Dr. Lynne Leader  Reason for consult:  migraine  Dear Dr Georgina Snell:  Thank you for your kind referral of Kelsey [name] for consultation of the above symptoms. Although her history is well known to you, please allow me to reiterate it for the purpose of our medical record. Records and images were personally reviewed where available.  HISTORY OF PRESENT ILLNESS: This is a pleasant 37 year old right-handed woman last seen in our office in August 2016 presenting today after an episode of vertigo last April 2019. She has a history of vestibular neuritis with chronic intermittent vertigo, migraines, IBS. When she was living in [place], she had been followed by [facility] for daily headaches after a concussion from a fall in 2014. She has been taking amitriptyline for IBS. She underwent vestibular therapy for several months in [place] for eye tracking and vestibular exercises. On her initial visit in 2015, she was reporting daily headaches that improved with higher dose of amitriptyline (taking 160m dose), reduced down to 12.526mwhen she was preparing for pregnancy in 2016. She continues on low dose amitriptyline 2529m/2 tab qhs, which also helps with sleep. Migraines were only occurring sporadically every couple of months, but were more intense, lasting a day with focusing difficulties. She would take rizatriptan with good effect, however after a migraine last year with rizatriptan not helping, she has not taken it since. She usually drinks a cup of coffee which takes the edge off. Last April 2019, she was sitting with her son [name] suddenly the room started spinning and she could feel her eyes jumping, everything in the room was bouncing for around 8 minutes. She does not recall having any nausea or headache but recalls feeling that her left side was  numb. ER notes indicate she was given Phenergan which caused side effects, then meclizine. She had an MRI brain without contrast which I personally reviewed, normal, no acute changes seen. She was discharged home in improved condition and has not had similar vertigo episodes since then. Symptoms were attributed to possible stress, her father had passed away just a few days prior. She still has focusing difficulties but has seen her ophthalmologist and diagnosed with dry eye, using an eye drop TID. She had a migraine last week with no nausea/vomiting. She tried Topamax briefly which again caused side effects. She had a headache for 3 days last weekend with pressure on her right brow, ear, pushing down the roof of her mouth on the right side. She denies any diplopia, dysarthria/dysphagia. She has neck pain. No falls.   PAST MEDICAL HISTORY: Past Medical History:  Diagnosis Date  . Anxiety and depression   . Asthma   . Depression   . Endometriosis   . Frequent headaches   . GERD (gastroesophageal reflux disease)   . Hx of varicella   . IBS (irritable bowel syndrome)   . Migraines   . Panic attacks   . Postpartum care following cesarean delivery (2/18) 07/08/2015  . Vertigo 12/31/2013    PAST SURGICAL HISTORY: Past Surgical History:  Procedure Laterality Date  . CESAREAN SECTION N/A 07/08/2015   Procedure: CESAREAN SECTION;  Surgeon: KelAloha GellD;  [facility];  Service: Obstetrics;  Laterality: N/A;  . NO PAST SURGERIES      MEDICATIONS: Current Outpatient Medications on File Prior to Visit  Medication Sig Dispense Refill  .  albuterol (PROVENTIL HFA;VENTOLIN HFA) 108 (90 Base) MCG/ACT inhaler INHALE 2 PUFFS INTO THE LUNGS EVERY 4 (FOUR) HOURS AS NEEDED FOR WHEEZING. 6.7 Inhaler 1  . amitriptyline (ELAVIL) 25 MG tablet Take 0.5 tablets (12.5 mg total) by mouth at bedtime. 45 tablet 3  . BIOTIN PO Take by mouth.    . diclofenac sodium (VOLTAREN) 1 % GEL Apply 4 g topically 4  (four) times daily. To affected joint. 100 g 11  . fluticasone (FLONASE) 50 MCG/ACT nasal spray 2 SPRAYS BY EACH NARE ROUTE DAILY. 16 g 6  . norethindrone (MICRONOR,CAMILA,ERRIN) 0.35 MG tablet Take by mouth.    . pantoprazole (PROTONIX) 40 MG tablet TAKE 1 TABLET BY MOUTH EVERY DAY 90 tablet 0  . Prenatal Vit-Fe Fumarate-FA (PRENATAL MULTIVITAMIN) TABS tablet Take 1 tablet by mouth daily at 12 noon.    . Riboflavin 400 MG TABS Take by mouth.    . rizatriptan (MAXALT-MLT) 10 MG disintegrating tablet Take  1 tablet at the onset of your headache, repeat a dose in 2 hours if you need more relief. Do not use more than 3 days a week. 10 tablet 12  . topiramate (TOPAMAX) 100 MG tablet One half tab by mouth daily for a week, then one tab by mouth daily (Patient not taking) 90 tablet 1   No current facility-administered medications on file prior to visit.     ALLERGIES: Allergies  Allergen Reactions  . Catfish [Fish Allergy] Shortness Of Breath  . Fish-Derived Products Shortness Of Breath  . Pineapple Hives  . Citric Acid     Per patient report  . Tomato Flavor  [Flavoring Agent]     Mouth sores  . Phenergan [Promethazine Hcl] Other (See Comments)    Restless motion    FAMILY HISTORY: Family History  Problem Relation Age of Onset  . Hypertension Mother   . Cancer Mother        multiple myeloma  . Hypertension Father   . Stroke Father   . Cancer Maternal Grandmother        CLL    SOCIAL HISTORY: Social History   Socioeconomic History  . Marital status: Married    Spouse name: Not on file  . Number of children: 0  . Years of education: Not on file  . Highest education level: Not on file  Occupational History  . Not on file  Social Needs  . Financial resource strain: Not on file  . Food insecurity:    Worry: Not on file    Inability: Not on file  . Transportation needs:    Medical: Not on file    Non-medical: Not on file  Tobacco Use  . Smoking status: Never Smoker  .  Smokeless tobacco: Never Used  Substance and Sexual Activity  . Alcohol use: No    Alcohol/week: 0.0 standard drinks  . Drug use: No  . Sexual activity: Yes    Birth control/protection: Pill  Lifestyle  . Physical activity:    Days per week: Not on file    Minutes per session: Not on file  . Stress: Not on file  Relationships  . Social connections:    Talks on phone: Not on file    Gets together: Not on file    Attends religious service: Not on file    Active member of club or organization: Not on file    Attends meetings of clubs or organizations: Not on file    Relationship status: Not on file  .  Intimate partner violence:    Fear of current or ex partner: Not on file    Emotionally abused: Not on file    Physically abused: Not on file    Forced sexual activity: Not on file  Other Topics Concern  . 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      Home Situation: lives with husband      Spiritual Beliefs: Christian      Lifestyle: doing 20 minutes of exercise and a little weight lifting daily; diet is healthy             REVIEW OF SYSTEMS: Constitutional: No fevers, chills, or sweats, no generalized fatigue, change in appetite Eyes: No visual changes, double vision, eye pain Ear, nose and throat: No hearing loss, ear pain, nasal congestion, sore throat Cardiovascular: No chest pain, palpitations Respiratory:  No shortness of breath at rest or with exertion, wheezes GastrointestinaI: No nausea, vomiting, diarrhea, abdominal pain, fecal incontinence Genitourinary:  No dysuria, urinary retention or frequency Musculoskeletal:  + neck pain, back pain Integumentary: No rash, pruritus, skin lesions Neurological: as above Psychiatric: No depression, insomnia, anxiety Endocrine: No palpitations, fatigue, diaphoresis, mood swings, change in appetite, change in weight, increased thirst Hematologic/Lymphatic:  No anemia, purpura,  petechiae. Allergic/Immunologic: no itchy/runny eyes, nasal congestion, recent allergic reactions, rashes  PHYSICAL EXAM: Vitals:   03/20/18 1041  BP: 110/68  Pulse: 95  SpO2: 99%   General: No acute distress Head:  Normocephalic/atraumatic Eyes: Fundoscopic exam shows bilateral sharp discs, no vessel changes, exudates, or hemorrhages Neck: supple, no paraspinal tenderness, full range of motion Back: No paraspinal tenderness Heart: regular rate and rhythm Lungs: Clear to auscultation bilaterally. Vascular: No carotid bruits. Skin/Extremities: No rash, no edema Neurological Exam: Mental status: alert and oriented to person, place, and time, no dysarthria or aphasia, Fund of knowledge is appropriate.  Recent and remote memory are intact.  Attention and concentration are normal.    Able to name objects and repeat phrases. Cranial nerves: CN I: not tested CN II: pupils equal, round and reactive to light, visual fields intact, fundi unremarkable. CN III, IV, VI:  full range of motion, no nystagmus, no ptosis CN V: reports decreased pin on right V1, otherwise normal  CN VII: upper and lower face symmetric CN VIII: hearing intact to finger rub CN IX, X: gag intact, uvula midline CN XI: sternocleidomastoid and trapezius muscles intact CN XII: tongue midline Bulk & Tone: normal, no fasciculations. Motor: 5/5 throughout with no pronator drift. Sensation: intact to light touch, cold, pin, vibration and joint position sense.  No extinction to double simultaneous stimulation.  Romberg test negative Deep Tendon Reflexes: +2 throughout, no ankle clonus Plantar responses: downgoing bilaterally Cerebellar: no incoordination on finger to nose, heel to shin. No dysdiadochokinesia Gait: narrow-based and steady, able to tandem walk adequately. Tremor: none  IMPRESSION: This is a pleasant 37 year old right-handed woman with a history of migraines, IBS, last seen in our office in 2016, she had  initially presented with a history of vestibular neuritis with chronic intermittent vertigo and was having daily headaches, which had an excellent response to higher dose amitriptyline, dose reduced when she got pregnant in 2016. The migraines are episodic, she had an episode of vertigo last April 2019 with normal MRI brain, likely related to prior history of chronic intermittent vertigo versus vertiginous migraine. Neurological exam today normal. She is hesitant to increase amitriptyline dose as  she is hoping to have another child. We agreed to continue to monitor symptoms for now, she will keep a symptom calendar, we may increase amitriptyline dose if needed. Follow-up in 6 months, she knows to call for any changes.   Thank you for allowing me to participate in the care of this patient. Please do not hesitate to call for any questions or concerns.   Ellouise Newer, M.D.  CC: Dr. Georgina Snell

## 2018-03-20 NOTE — Patient Instructions (Signed)
Great seeing you! Continue all your medications. Keep a calendar of your migraines, follow-up in 6 months or so, call for any changes.

## 2018-04-08 ENCOUNTER — Emergency Department (INDEPENDENT_AMBULATORY_CARE_PROVIDER_SITE_OTHER)
Admission: EM | Admit: 2018-04-08 | Discharge: 2018-04-08 | Disposition: A | Discharge status: Home / Self Care | Payer: 59 | Source: Home / Self Care | Attending: Family Medicine | Admitting: Family Medicine

## 2018-04-08 ENCOUNTER — Encounter: Payer: Self-pay | Admitting: Emergency Medicine

## 2018-04-08 DIAGNOSIS — R42 Dizziness and giddiness: Secondary | ICD-10-CM | POA: Diagnosis not present

## 2018-04-08 MED ORDER — MECLIZINE HCL 25 MG PO TABS: 25.0000 mg | ORAL_TABLET | Freq: Three times a day (TID) | ORAL | 0 refills | Status: DC | PRN

## 2018-04-08 NOTE — ED Provider Notes (Signed)
[name]    CSN: [id number] Arrival date & time: 04/08/18  1610     History   Chief Complaint Chief Complaint  Patient presents with  . Dizziness    HPI Kelsey [name] is a 37 y.o. female.   HPI  Kelsey [name] is a 37 y.o. female presenting to UC with c/o intermittent dizziness that started suddenly this morning. Pt notes she quickly got out of bed to catch something that was falling and wonders if that caused this episode of dizziness. Hx of vertigo, symptoms feel the same. Denies HA, change in vision, nausea. Denies chest pain or palpitations. No recent URI symptoms. She was seen for a routine f/u for migraines by her neurologist a few weeks ago, normal f/u per pt. No change in medications. Denies weakness or numbness in arms or legs. She has not tried anything for her symptoms but notes last time she was tx for vertigo in the hospital, she received meclizine via IV and symptoms resolved by the next day.    Past Medical History:  Diagnosis Date  . Anxiety and depression   . Asthma   . Depression   . Endometriosis   . Frequent headaches   . GERD (gastroesophageal reflux disease)   . Hx of varicella   . IBS (irritable bowel syndrome)   . Migraines   . Panic attacks   . Postpartum care following cesarean delivery (2/18) 07/08/2015  . Vertigo 12/31/2013    Patient Active Problem List   Diagnosis Date Noted  . 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 Arthralgia 06/09/2017  . Abnormal laboratory test 09/07/2014  . Asthma, mild persistent 03/08/2014  . GERD (gastroesophageal reflux disease) 03/08/2014  . Seasonal allergies 03/08/2014  . IBS (irritable bowel syndrome) 03/08/2014  . Anxiety and depression 03/08/2014  . Chronic daily headache 12/31/2013  . Vertigo 12/31/2013  . Postconcussive syndrome 12/31/2013    Past Surgical History:  Procedure Laterality Date  . CESAREAN SECTION N/A 07/08/2015   Procedure: CESAREAN SECTION;  Surgeon: Aloha Gell, MD;   Location: [facility];  Service: Obstetrics;  Laterality: N/A;  . NO PAST SURGERIES      OB History    Gravida  1   Para  1   Term  1   Preterm      AB      Living  1     SAB      TAB      Ectopic      Multiple  0   Live Births  1            Home Medications    Prior to Admission medications   Medication Sig Start Date End Date Taking? Authorizing Provider  albuterol (PROVENTIL HFA;VENTOLIN HFA) 108 (90 Base) MCG/ACT inhaler INHALE 2 PUFFS INTO THE LUNGS EVERY 4 (FOUR) HOURS AS NEEDED FOR WHEEZING. 07/02/17   Gregor Hams, MD  amitriptyline (ELAVIL) 25 MG tablet Take 0.5 tablets (12.5 mg total) by mouth at bedtime. 07/11/17   Gregor Hams, MD  BIOTIN PO Take by mouth.    [provider]  diclofenac sodium (VOLTAREN) 1 % GEL Apply 4 g topically 4 (four) times daily. To affected joint. 06/09/17   Gregor Hams, MD  fluticasone (FLONASE) 50 MCG/ACT nasal spray 2 SPRAYS BY EACH NARE ROUTE DAILY. 02/02/18   Gregor Hams, MD  meclizine (ANTIVERT) 25 MG tablet Take 1 tablet (25 mg total) by mouth 3 (three) times daily as needed for  dizziness. 04/08/18   Noe Gens, PA-C  norethindrone (MICRONOR,CAMILA,ERRIN) 0.35 MG tablet Take by mouth.    [provider]  pantoprazole (PROTONIX) 40 MG tablet TAKE 1 TABLET BY MOUTH EVERY DAY 03/13/18   Gregor Hams, MD  Prenatal Vit-Fe Fumarate-FA (PRENATAL MULTIVITAMIN) TABS tablet Take 1 tablet by mouth daily at 12 noon.    [provider]  Riboflavin 400 MG TABS Take by mouth.    [provider]  rizatriptan (MAXALT-MLT) 10 MG disintegrating tablet Take  1 tablet at the onset of your headache, repeat a dose in 2 hours if you need more relief. Do not use more than 3 days a week. Patient not taking: Reported on 03/20/2018 08/26/16   Gregor Hams, MD  topiramate (TOPAMAX) 100 MG tablet One half tab by mouth daily for a week, then one tab by mouth daily. Patient not taking: Reported on 03/20/2018 11/19/17    Gregor Hams, MD    Family History Family History  Problem Relation Age of Onset  . Hypertension Mother   . Cancer Mother        multiple myeloma  . Hypertension Father   . Stroke Father   . Cancer Maternal Grandmother        CLL    Social History Social History   Tobacco Use  . Smoking status: Never Smoker  . Smokeless tobacco: Never Used  Substance Use Topics  . Alcohol use: No    Alcohol/week: 0.0 standard drinks  . Drug use: No     Allergies   Catfish [fish allergy]; Fish-derived products; Pineapple; Citric acid; Tomato flavor  [flavoring agent]; and Phenergan [promethazine hcl]   Review of Systems Review of Systems  Constitutional: Negative for chills and fever.  HENT: Negative for congestion and ear pain.   Eyes: Negative for visual disturbance.  Respiratory: Negative for chest tightness and shortness of breath.   Cardiovascular: Negative for chest pain and palpitations.  Gastrointestinal: Negative for nausea.  Musculoskeletal: Negative for neck pain and neck stiffness.  Neurological: Positive for dizziness and light-headedness. Negative for syncope, facial asymmetry, speech difficulty, weakness, numbness and headaches.     Physical Exam Triage Vital Signs ED Triage Vitals  Enc Vitals Group     BP 04/08/18 1629 119/82     Pulse Rate 04/08/18 1629 83     Resp --      Temp 04/08/18 1629 98.4 F (36.9 C)     Temp Source 04/08/18 1629 Oral     SpO2 04/08/18 1629 100 %     Weight 04/08/18 1630 140 lb (63.5 kg)     Height --      Head Circumference --      Peak Flow --      Pain Score 04/08/18 1630 0     Pain Loc --      Pain Edu? --      Excl. in [place]? --    Orthostatic VS for the past 24 hrs:  BP- Lying Pulse- Lying BP- Sitting Pulse- Sitting BP- Standing at 0 minutes Pulse- Standing at 0 minutes  04/08/18 1644 113/80 62 120/81 72 114/82 78    Updated Vital Signs BP 119/82 (BP Location: Right Arm)   Pulse 83   Temp 98.4 F (36.9 C) (Oral)    Wt 140 lb (63.5 kg)   SpO2 100%   BMI 23.30 kg/m   Visual Acuity Right Eye Distance:   Left Eye Distance:   Bilateral Distance:  Right Eye Near:   Left Eye Near:    Bilateral Near:     Physical Exam  Constitutional: She is oriented to person, place, and time. She appears well-developed and well-nourished.  HENT:  Head: Normocephalic and atraumatic.  Right Ear: Tympanic membrane normal.  Left Ear: Tympanic membrane normal.  Nose: Nose normal. Right sinus exhibits no maxillary sinus tenderness and no frontal sinus tenderness. Left sinus exhibits no maxillary sinus tenderness and no frontal sinus tenderness.  Mouth/Throat: Uvula is midline, oropharynx is clear and moist and mucous membranes are normal.  Eyes: Pupils are equal, round, and reactive to light. Conjunctivae and EOM are normal.  Neck: Normal range of motion. Neck supple.  Cardiovascular: Normal rate and regular rhythm.  Pulmonary/Chest: Effort normal and breath sounds normal. No stridor. No respiratory distress. She has no wheezes. She has no rales.  Musculoskeletal: Normal range of motion.  Neurological: She is alert and oriented to person, place, and time. No cranial nerve deficit.  CN II-XII in tact. Speech is clear. Negative Romberg. Normal gait. Normal finger to nose coordination.   Skin: Skin is warm and dry.  Psychiatric: She has a normal mood and affect. Her behavior is normal.  Nursing note and vitals reviewed.    UC Treatments / Results  Labs (all labs ordered are listed, but only abnormal results are displayed) Labs Reviewed - No data to display  EKG None  Radiology No results found.  Procedures Procedures (including critical care time)  Medications Ordered in UC Medications - No data to display  Initial Impression / Assessment and Plan / UC Course  I have reviewed the triage vital signs and the nursing notes.  Pertinent labs & imaging results that were available during my care of the  patient were reviewed by me and considered in my medical decision making (see chart for details).    Orthostatic vitals normal, however, pt became more dizzy going from lying to sitting.  Tympanometry: Normal in Right ear, mild positive peak pressure in Left ear, however, due to only being slightly positive, lack of congestion and fever, doubt AOM Hx exam c/w BPPV Doubt CVA or other emergent process taking place. Will have pt try trial of meclizine. Discussed symptoms that warrant emergent care in the ED.   Final Clinical Impressions(s) / UC Diagnoses   Final diagnoses:  Dizziness     Discharge Instructions      Antivert (meclizine) is a medication to help with dizziness and nausea related to vertigo.  This medication can cause drowsiness. Do not operate heavy machinery or drive while taking.   Please follow up with family medicine if not improving by Friday, or schedule a follow up with your neurologist.  If significantly worsening or new concerning symptoms develop, call 911 and have someone drive you to the closest hospital.     ED Prescriptions    Medication Sig Dispense Auth. Provider   meclizine (ANTIVERT) 25 MG tablet Take 1 tablet (25 mg total) by mouth 3 (three) times daily as needed for dizziness. 30 tablet Noe Gens, PA-C     Controlled Substance Prescriptions [place] Controlled Substance Registry consulted? Not Applicable   Noe Gens, PA-C 04/09/18 1145

## 2018-04-08 NOTE — Discharge Instructions (Signed)
°  Antivert (meclizine) is a medication to help with dizziness and nausea related to vertigo.  This medication can cause drowsiness. Do not operate heavy machinery or drive while taking.   Please follow up with family medicine if not improving by Friday, or schedule a follow up with your neurologist.  If significantly worsening or new concerning symptoms develop, call 911 and have someone drive you to the closest hospital.

## 2018-04-08 NOTE — ED Triage Notes (Signed)
Pt  C/o dizziness that started suddenly this am. Hx of vertigo and states this feels the same.

## 2018-04-10 ENCOUNTER — Telehealth: Payer: Self-pay | Admitting: Emergency Medicine

## 2018-04-10 NOTE — Telephone Encounter (Signed)
Message left on voice mail inquiring about patient's status and encouraging patient to call with questions/concerns.  

## 2018-06-04 ENCOUNTER — Other Ambulatory Visit: Payer: Self-pay | Admitting: Family Medicine

## 2018-06-05 ENCOUNTER — Other Ambulatory Visit: Payer: Self-pay

## 2018-06-05 ENCOUNTER — Emergency Department (INDEPENDENT_AMBULATORY_CARE_PROVIDER_SITE_OTHER)
Admission: EM | Admit: 2018-06-05 | Discharge: 2018-06-05 | Disposition: A | Discharge status: Home / Self Care | Payer: 59 | Source: Home / Self Care | Attending: Emergency Medicine | Admitting: Emergency Medicine

## 2018-06-05 DIAGNOSIS — J101 Influenza due to other identified influenza virus with other respiratory manifestations: Secondary | ICD-10-CM

## 2018-06-05 LAB — POCT INFLUENZA A/B
INFLUENZA A, POC: POSITIVE — AB
INFLUENZA B, POC: NEGATIVE

## 2018-06-05 MED ORDER — OSELTAMIVIR PHOSPHATE 75 MG PO CAPS: 75.0000 mg | ORAL_CAPSULE | Freq: Two times a day (BID) | ORAL | 0 refills | Status: DC

## 2018-06-05 NOTE — ED Provider Notes (Addendum)
[name]    CSN: [id number] Arrival date & time: 06/05/18  1012     History   Chief Complaint Chief Complaint  Patient presents with  . Fever  . Nasal Congestion  . Generalized Body Aches    HPI Kelsey [name] is a 38 y.o. female.  Patient presents with onset of symptoms this morning.  She has had headache myalgias sore throat and a dry cough.  Her child was seen today and diagnosed with flu she comes here now for a flu test.  She did have a flu vaccine this year.  Fever  Associated symptoms: congestion, cough and myalgias     Past Medical History:  Diagnosis Date  . Anxiety and depression   . Asthma   . Depression   . Endometriosis   . Frequent headaches   . GERD (gastroesophageal reflux disease)   . Hx of varicella   . IBS (irritable bowel syndrome)   . Migraines   . Panic attacks   . Postpartum care following cesarean delivery (2/18) 07/08/2015  . Vertigo 12/31/2013    Patient Active Problem List   Diagnosis Date Noted  . 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 Arthralgia 06/09/2017  . Abnormal laboratory test 09/07/2014  . Asthma, mild persistent 03/08/2014  . GERD (gastroesophageal reflux disease) 03/08/2014  . Seasonal allergies 03/08/2014  . IBS (irritable bowel syndrome) 03/08/2014  . Anxiety and depression 03/08/2014  . Chronic daily headache 12/31/2013  . Vertigo 12/31/2013  . Postconcussive syndrome 12/31/2013    Past Surgical History:  Procedure Laterality Date  . CESAREAN SECTION N/A 07/08/2015   Procedure: CESAREAN SECTION;  Surgeon: Aloha Gell, MD;  Location: [facility];  Service: Obstetrics;  Laterality: N/A;  . NO PAST SURGERIES      OB History    Gravida  1   Para  1   Term  1   Preterm      AB      Living  1     SAB      TAB      Ectopic      Multiple  0   Live Births  1            Home Medications    Prior to Admission medications   Medication Sig Start Date End Date Taking? Authorizing Provider   albuterol (PROVENTIL HFA;VENTOLIN HFA) 108 (90 Base) MCG/ACT inhaler INHALE 2 PUFFS INTO THE LUNGS EVERY 4 (FOUR) HOURS AS NEEDED FOR WHEEZING. 07/02/17   Gregor Hams, MD  amitriptyline (ELAVIL) 25 MG tablet Take 0.5 tablets (12.5 mg total) by mouth at bedtime. 07/11/17   Gregor Hams, MD  BIOTIN PO Take by mouth.    [provider]  diclofenac sodium (VOLTAREN) 1 % GEL Apply 4 g topically 4 (four) times daily. To affected joint. 06/09/17   Gregor Hams, MD  fluticasone (FLONASE) 50 MCG/ACT nasal spray 2 SPRAYS BY EACH NARE ROUTE DAILY. 02/02/18   Gregor Hams, MD  JUNEL 1/20 1-20 MG-MCG tablet TAKE 1 (ONE) TABLET DAILY CONTINUOUS ACTIVE PILLS 05/19/18   [provider]  meclizine (ANTIVERT) 25 MG tablet Take 1 tablet (25 mg total) by mouth 3 (three) times daily as needed for dizziness. 04/08/18   Noe Gens, PA-C  norethindrone (MICRONOR,CAMILA,ERRIN) 0.35 MG tablet Take by mouth.    [provider]  pantoprazole (PROTONIX) 40 MG tablet TAKE 1 TABLET BY MOUTH EVERY DAY 06/04/18   Gregor Hams, MD  Prenatal  Vit-Fe Fumarate-FA (PRENATAL MULTIVITAMIN) TABS tablet Take 1 tablet by mouth daily at 12 noon.    [provider]  Riboflavin 400 MG TABS Take by mouth.    [provider]  rizatriptan (MAXALT-MLT) 10 MG disintegrating tablet Take  1 tablet at the onset of your headache, repeat a dose in 2 hours if you need more relief. Do not use more than 3 days a week. Patient not taking: Reported on 03/20/2018 08/26/16   Gregor Hams, MD  topiramate (TOPAMAX) 100 MG tablet One half tab by mouth daily for a week, then one tab by mouth daily. Patient not taking: Reported on 03/20/2018 11/19/17   Gregor Hams, MD    Family History Family History  Problem Relation Age of Onset  . Hypertension Mother   . Cancer Mother        multiple myeloma  . Hypertension Father   . Stroke Father   . Cancer Maternal Grandmother        CLL    Social History Social  History   Tobacco Use  . Smoking status: Never Smoker  . Smokeless tobacco: Never Used  Substance Use Topics  . Alcohol use: No    Alcohol/week: 0.0 standard drinks  . Drug use: No     Allergies   Catfish [fish allergy]; Fish-derived products; Pineapple; Citric acid; Tomato flavor  [flavoring agent]; and Phenergan [promethazine hcl]   Review of Systems Review of Systems  Constitutional: Positive for fever.  HENT: Positive for congestion.   Respiratory: Positive for cough.   Musculoskeletal: Positive for myalgias.     Physical Exam Triage Vital Signs ED Triage Vitals  Enc Vitals Group     BP 06/05/18 1044 115/79     Pulse Rate 06/05/18 1044 98     Resp 06/05/18 1044 18     Temp 06/05/18 1044 98.3 F (36.8 C)     Temp Source 06/05/18 1044 Oral     SpO2 06/05/18 1044 97 %     Weight 06/05/18 1045 139 lb (63 kg)     Height 06/05/18 1045 '5\' 5"'  (1.651 m)     Head Circumference --      Peak Flow --      Pain Score 06/05/18 1044 2     Pain Loc --      Pain Edu? --      Excl. in [place]? --    No data found.  Updated Vital Signs BP 115/79 (BP Location: Right Arm)   Pulse 98   Temp 98.3 F (36.8 C) (Oral)   Resp 18   Ht '5\' 5"'  (1.651 m)   Wt 63 kg   LMP 05/22/2018   SpO2 97%   BMI 23.13 kg/m   Visual Acuity Right Eye Distance:   Left Eye Distance:   Bilateral Distance:    Right Eye Near:   Left Eye Near:    Bilateral Near:     Physical Exam Constitutional:      Appearance: Normal appearance.  HENT:     Head: Normocephalic.     Right Ear: Tympanic membrane normal.     Left Ear: Tympanic membrane normal.     Nose: Nose normal.     Mouth/Throat:     Mouth: Mucous membranes are moist.     Pharynx: No posterior oropharyngeal erythema.  Neck:     Musculoskeletal: Normal range of motion.  Cardiovascular:     Rate and Rhythm: Normal rate and regular rhythm.  Pulses: Normal pulses.     Heart sounds: Normal heart sounds.  Pulmonary:     Effort:  Pulmonary effort is normal.     Breath sounds: Normal breath sounds.  Neurological:     Mental Status: She is alert.      UC Treatments / Results  Labs (all labs ordered are listed, but only abnormal results are displayed) Labs Reviewed  POCT INFLUENZA A/B - Abnormal; Notable for the following components:      Result Value   Influenza A, POC Positive (*)    All other components within normal limits    EKG None  Radiology No results found.  Procedures Procedures (including critical care time)  Medications Ordered in UC Medications - No data to display  Initial Impression / Assessment and Plan / UC Course  I have reviewed the triage vital signs and the nursing notes.  Pertinent labs & imaging results that were available during my care of the patient were reviewed by me and considered in my medical decision making (see chart for details). Patient started with symptoms this morning.  She tested positive for flu A.  Will treat with Tamiflu which she has taken before.     Final Clinical Impressions(s) / UC Diagnoses   Final diagnoses:  Influenza A     Discharge Instructions     Please rest and drink good amounts of fluids.  You can take Tylenol for fever.  If you develop chest pain or shortness of breath please be seen in the emergency room for evaluation or immediately at the urgent care center.    ED Prescriptions    None     Controlled Substance Prescriptions [place] Controlled Substance Registry consulted? Not Applicable   Darlyne Russian, MD 06/05/18 1108    Darlyne Russian, MD 06/05/18 1309

## 2018-06-05 NOTE — Discharge Instructions (Addendum)
Please rest and drink good amounts of fluids.  You can take Tylenol for fever.  If you develop chest pain or shortness of breath please be seen in the emergency room for evaluation or immediately at the urgent care center.

## 2018-06-05 NOTE — ED Triage Notes (Signed)
Son tested positive for Flu A this am.  Pt stated that she felt like she has had a cold since Christmas, last night fever, runny nose, body aches, sinus pressure, ear pain and eye pain.

## 2018-06-07 ENCOUNTER — Telehealth: Payer: Self-pay | Admitting: Emergency Medicine

## 2018-06-07 NOTE — Telephone Encounter (Signed)
Patient states only feeling slightly better; is taking tamiflu rx.

## 2018-06-25 ENCOUNTER — Other Ambulatory Visit: Payer: Self-pay | Admitting: Family Medicine

## 2018-07-17 ENCOUNTER — Other Ambulatory Visit: Payer: Self-pay | Admitting: Family Medicine

## 2018-07-22 ENCOUNTER — Other Ambulatory Visit: Payer: Self-pay

## 2018-07-22 ENCOUNTER — Emergency Department (INDEPENDENT_AMBULATORY_CARE_PROVIDER_SITE_OTHER)
Admission: EM | Admit: 2018-07-22 | Discharge: 2018-07-22 | Disposition: A | Discharge status: Home / Self Care | Payer: 59 | Source: Home / Self Care | Attending: Emergency Medicine | Admitting: Emergency Medicine

## 2018-07-22 DIAGNOSIS — R112 Nausea with vomiting, unspecified: Secondary | ICD-10-CM

## 2018-07-22 LAB — POCT URINALYSIS DIP (MANUAL ENTRY)
Bilirubin, UA: NEGATIVE
Blood, UA: NEGATIVE
Glucose, UA: NEGATIVE mg/dL
Ketones, POC UA: NEGATIVE mg/dL
Leukocytes, UA: NEGATIVE
Nitrite, UA: NEGATIVE
Protein Ur, POC: NEGATIVE mg/dL
Spec Grav, UA: 1.015 (ref 1.010–1.025)
Urobilinogen, UA: 0.2 E.U./dL
pH, UA: 8.5 — AB (ref 5.0–8.0)

## 2018-07-22 LAB — POCT CBC W AUTO DIFF (K'VILLE URGENT CARE)

## 2018-07-22 LAB — POCT URINE PREGNANCY: Preg Test, Ur: NEGATIVE

## 2018-07-22 MED ORDER — ONDANSETRON 4 MG PO TBDP: 4.0000 mg | ORAL_TABLET | Freq: Three times a day (TID) | ORAL | 0 refills | Status: DC | PRN

## 2018-07-22 MED ORDER — HYOSCYAMINE SULFATE 0.125 MG PO TABS: ORAL_TABLET | ORAL | 0 refills | Status: DC

## 2018-07-22 NOTE — ED Triage Notes (Signed)
Pt c/o vomiting and abd cramping since last night. Went to eat out with friends, also mentioned change in diet for lent. Eating more dairy and beans than usual. 1am was last time she vomited.

## 2018-07-22 NOTE — ED Provider Notes (Signed)
[name]    CSN: [id number] Arrival date & time: 07/22/18  5400     History   Chief Complaint Chief Complaint  Patient presents with  . Emesis  . Abdominal Cramping    HPI Kelsey [name] is a 38 y.o. female.   HPI  Chief complaint is abdominal pain nausea vomiting and abdominal cramping, onset last night after going out to dinner with friends.  No one else she was with is sick.  Also, patient mentions that she changed her diet for lent and eating more dairy and beans than usual. At 1 AM, that was the last time she vomited.  No blood or mucus. Since then, feels mildly nauseated at times and mild abdominal pain and cramps, nonspecific and it is generalized without any specific location.  No radiation. Feels mild nausea, but she tolerated small amount of p.o. solids and clears a few hours ago.  Last bowel movement was normal yesterday without blood or melena. She tried Zofran that she had at home, and that helped somewhat.  She also tried Pepto-Bismol and that may have helped a little bit. She is not sure when her LMP was, but she does not think that she could be pregnant. Denies vaginal bleeding or any GYN symptoms or pelvic pain. She states she thinks she had a low-grade fever.  Denies chills or rash. No history of tick bite or recent foreign travel. She states that she does have a history of anxiety which can trigger irritable bowel syndrome, but she denies any specific anxiety or stressors. Denies chest pain or shortness of breath or cough or sinus congestion or URI symptoms. Denies syncope or lightheadedness or focal neurologic symptoms or seizures.  Currently denies headache.   Past Medical History:  Diagnosis Date  . Anxiety and depression   . Asthma   . Depression   . Endometriosis   . Frequent headaches   . GERD (gastroesophageal reflux disease)   . Hx of varicella   . IBS (irritable bowel syndrome)   . Migraines   . Panic attacks   . Postpartum  care following cesarean delivery (2/18) 07/08/2015  . Vertigo 12/31/2013    Patient Active Problem List   Diagnosis Date Noted  . 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 Arthralgia 06/09/2017  . Abnormal laboratory test 09/07/2014  . Asthma, mild persistent 03/08/2014  . GERD (gastroesophageal reflux disease) 03/08/2014  . Seasonal allergies 03/08/2014  . IBS (irritable bowel syndrome) 03/08/2014  . Anxiety and depression 03/08/2014  . Chronic daily headache 12/31/2013  . Vertigo 12/31/2013  . Postconcussive syndrome 12/31/2013    Past Surgical History:  Procedure Laterality Date  . CESAREAN SECTION N/A 07/08/2015   Procedure: CESAREAN SECTION;  Surgeon: Aloha Gell, MD;  Location: [facility];  Service: Obstetrics;  Laterality: N/A;  . NO PAST SURGERIES      OB History    Gravida  1   Para  1   Term  1   Preterm      AB      Living  1     SAB      TAB      Ectopic      Multiple  0   Live Births  1            Home Medications    Prior to Admission medications   Medication Sig Start Date End Date Taking? Authorizing Provider  albuterol (PROVENTIL HFA;VENTOLIN HFA) 108 (90 Base) MCG/ACT inhaler INHALE 2 PUFFS  INTO THE LUNGS EVERY 4 (FOUR) HOURS AS NEEDED FOR WHEEZING. 07/02/17   Gregor Hams, MD  amitriptyline (ELAVIL) 25 MG tablet TAKE 1/2 TABLET BY MOUTH AT BEDTIME 07/20/18   Gregor Hams, MD  BIOTIN PO Take by mouth.    [provider]  diclofenac sodium (VOLTAREN) 1 % GEL Apply 4 g topically 4 (four) times daily. To affected joint. 06/09/17   Gregor Hams, MD  fluticasone (FLONASE) 50 MCG/ACT nasal spray 2 SPRAYS BY EACH NARE ROUTE DAILY. 02/02/18   Gregor Hams, MD  hyoscyamine (LEVSIN, ANASPAZ) 0.125 MG tablet Take one by mouth every 4-6 hours as needed for abdominal cramping or pain 07/22/18   Jacqulyn Cane, MD  JUNEL 1/20 1-20 MG-MCG tablet TAKE 1 (ONE) TABLET DAILY CONTINUOUS ACTIVE PILLS 05/19/18   [provider]  meclizine (ANTIVERT) 25  MG tablet Take 1 tablet (25 mg total) by mouth 3 (three) times daily as needed for dizziness. 04/08/18   Noe Gens, PA-C  norethindrone (MICRONOR,CAMILA,ERRIN) 0.35 MG tablet Take by mouth.    [provider]  ondansetron (ZOFRAN-ODT) 4 MG disintegrating tablet Take 1 tablet (4 mg total) by mouth every 8 (eight) hours as needed for nausea or vomiting. 07/22/18   Jacqulyn Cane, MD  oseltamivir (TAMIFLU) 75 MG capsule Take 1 capsule (75 mg total) by mouth every 12 (twelve) hours. 06/05/18   Darlyne Russian, MD  pantoprazole (PROTONIX) 40 MG tablet TAKE 1 TABLET BY MOUTH EVERY DAY 06/04/18   Gregor Hams, MD  Prenatal Vit-Fe Fumarate-FA (PRENATAL MULTIVITAMIN) TABS tablet Take 1 tablet by mouth daily at 12 noon.    [provider]  Riboflavin 400 MG TABS Take by mouth.    [provider]  rizatriptan (MAXALT-MLT) 10 MG disintegrating tablet Take  1 tablet at the onset of your headache, repeat a dose in 2 hours if you need more relief. Do not use more than 3 days a week. Patient not taking: Reported on 03/20/2018 08/26/16   Gregor Hams, MD  topiramate (TOPAMAX) 100 MG tablet One half tab by mouth daily for a week, then one tab by mouth daily. Patient not taking: Reported on 03/20/2018 11/19/17   Gregor Hams, MD    Family History Family History  Problem Relation Age of Onset  . Hypertension Mother   . Cancer Mother        multiple myeloma  . Hypertension Father   . Stroke Father   . Cancer Maternal Grandmother        CLL    Social History Social History   Tobacco Use  . Smoking status: Never Smoker  . Smokeless tobacco: Never Used  Substance Use Topics  . Alcohol use: No    Alcohol/week: 0.0 standard drinks  . Drug use: No     Allergies   Catfish [fish allergy]; Fish-derived products; Pineapple; Citric acid; Tomato flavor  [flavoring agent]; and Phenergan [promethazine hcl]   Review of Systems Review of Systems  All other systems reviewed and are  negative.  Pertinent items noted in HPI and remainder of comprehensive ROS otherwise negative.   Physical Exam Triage Vital Signs ED Triage Vitals  Enc Vitals Group     BP 07/22/18 0941 112/75     Pulse Rate 07/22/18 0941 77     Resp 07/22/18 0941 18     Temp 07/22/18 0941 98.1 F (36.7 C)     Temp Source 07/22/18 0941 Oral  SpO2 07/22/18 0941 100 %     Weight 07/22/18 0942 138 lb 14.2 oz (63 kg)     Height 07/22/18 0942 _0  (1.651 m)     Head Circumference --      Peak Flow --      Pain Score 07/22/18 0942 0     Pain Loc --      Pain Edu? --      Excl. in [place]? --    No data found.  Updated Vital Signs BP 112/75 (BP Location: Right Arm)   Pulse 77   Temp 98.1 F (36.7 C) (Oral)   Resp 18   Ht _1  (1.651 m)   Wt 63 kg   LMP  (LMP Unknown)   SpO2 100%   BMI 23.11 kg/m    Physical Exam Vitals signs and nursing note reviewed.  Constitutional:      General: She is not in acute distress.    Appearance: She is well-developed. She is not toxic-appearing.     Comments: Appears fatigued, but no acute distress. Pleasant, cooperative.  HENT:     Head: Normocephalic and atraumatic.     Nose: Nose normal.  Eyes:     General: No scleral icterus.    Pupils: Pupils are equal, round, and reactive to light.  Neck:     Musculoskeletal: Normal range of motion and neck supple.     Vascular: No JVD.  Cardiovascular:     Rate and Rhythm: Normal rate and regular rhythm.     Heart sounds: Normal heart sounds. No murmur.  Pulmonary:     Effort: Pulmonary effort is normal.     Breath sounds: Normal breath sounds.  Abdominal:     General: Bowel sounds are increased. There is no distension or abdominal bruit.     Palpations: Abdomen is soft. There is no mass.     Tenderness: There is abdominal tenderness (Minimal diffuse tenderness). There is no right CVA tenderness, left CVA tenderness, guarding or rebound. Negative signs include Murphy's sign and McBurney's sign.    Musculoskeletal: Normal range of motion.        General: No tenderness.  Lymphadenopathy:     Cervical: No cervical adenopathy.  Skin:    Findings: No rash.  Neurological:     Mental Status: She is alert and oriented to person, place, and time.      UC Treatments / Results  Labs (all labs ordered are listed, but only abnormal results are displayed) Labs Reviewed  POCT URINALYSIS DIP (MANUAL ENTRY) - Abnormal; Notable for the following components:      Result Value   pH, UA 8.5 (*)    All other components within normal limits  POCT CBC W AUTO DIFF ([facility])  POCT URINE PREGNANCY   Stat CBC within normal limits.  WBC 6.6 hemoglobin 14.5. Urinalysis normal. Stat POCT urine pregnancy test: Negative  EKG None  Radiology No results found.  Procedures Procedures (including critical care time)  Medications Ordered in UC Medications - No data to display  Initial Impression / Assessment and Plan / UC Course  I have reviewed the triage vital signs and the nursing notes.  Pertinent labs & imaging results that were available during my care of the patient were reviewed by me and considered in my medical decision making (see chart for details).     Clinically, patient has had nausea and vomiting and abdominal pain, but the vomiting has resolved and her nausea and  abdominal pain are mild now.  CBC, urinalysis normal.  Urine pregnancy test negative. There is no evidence of surgical abdomen.  Explained to patient that she likely had gastroenteritis of undetermined cause.  It is possible this could also be a flareup of IBS and related to eating more fiber and dairy recently.  We discussed quite at length today.  Questions invited and answered. Final Clinical Impressions(s) / UC Diagnoses   Final diagnoses:  Nausea and vomiting in adult  Treatment options discussed, as well as risks, benefits, alternatives. Patient voiced understanding and agreement with the following  plans:    Discharge Instructions     Based on history and physical exam and complete blood count and urine tests, you most likely had food poisoning or a viral gastroenteritis.-You are not contagious unless you have fever or actively vomiting. Please read attached instruction sheet on nausea and vomiting.  Rest, push clear liquids and advance to bland diet as tolerated. Prescription sent to your pharmacy: Hyoscyamine for abdominal cramping or pain.  Zofran oral disintegrating tablet, 1 every 8 hours as needed for nausea or vomiting. I would expect you to be better within 24 to 48 hours, but if not, follow-up with Dr. Georgina Snell, your PCP. If any severely worsening symptoms, go to emergency room.     ED Prescriptions    Medication Sig Dispense Auth. Provider   hyoscyamine (LEVSIN, ANASPAZ) 0.125 MG tablet Take one by mouth every 4-6 hours as needed for abdominal cramping or pain 15 tablet Jacqulyn Cane, MD   ondansetron (ZOFRAN-ODT) 4 MG disintegrating tablet Take 1 tablet (4 mg total) by mouth every 8 (eight) hours as needed for nausea or vomiting. 5 tablet Jacqulyn Cane, MD     Precautions discussed. Red flags discussed. Questions invited and answered. Patient voiced understanding and agreement.    Jacqulyn Cane, MD 07/24/18 [phone]

## 2018-07-22 NOTE — Discharge Instructions (Signed)
Based on history and physical exam and complete blood count and urine tests, you most likely had food poisoning or a viral gastroenteritis.-You are not contagious unless you have fever or actively vomiting. Please read attached instruction sheet on nausea and vomiting.  Rest, push clear liquids and advance to bland diet as tolerated. Prescription sent to your pharmacy: Hyoscyamine for abdominal cramping or pain.  Zofran oral disintegrating tablet, 1 every 8 hours as needed for nausea or vomiting. I would expect you to be better within 24 to 48 hours, but if not, follow-up with Dr. Denyse Amass, your PCP. If any severely worsening symptoms, go to emergency room.

## 2018-08-26 ENCOUNTER — Other Ambulatory Visit: Payer: Self-pay | Admitting: Family Medicine

## 2018-08-28 ENCOUNTER — Other Ambulatory Visit: Payer: Self-pay | Admitting: Family Medicine

## 2018-09-03 ENCOUNTER — Encounter: Payer: Self-pay | Admitting: Family Medicine

## 2018-10-07 ENCOUNTER — Other Ambulatory Visit: Payer: Self-pay | Admitting: Family Medicine

## 2018-10-23 ENCOUNTER — Encounter: Payer: Self-pay | Admitting: Neurology

## 2018-10-29 ENCOUNTER — Telehealth: Payer: 59 | Admitting: Physician Assistant

## 2018-10-29 DIAGNOSIS — L299 Pruritus, unspecified: Secondary | ICD-10-CM

## 2018-10-29 NOTE — Progress Notes (Signed)
Based on what you shared with me, I feel your condition warrants further evaluation and I recommend that you be seen for a face to face office visit with your primary care provider. It is very hard to tell from the picture if this is more an issue with seborrheic dermatitis, fungal infection or other cause of symptom. You will need an examination to determine the most appropriate course of treatment giving these and that symptoms have been present for a couple of months without improvement.      NOTE: If you entered your credit card information for this eVisit, you will not be charged. You may see a "hold" on your card for the $35 but that hold will drop off and you will not have a charge processed.  If you are having a true medical emergency please call 911.  If you need an urgent face to face visit, [facility] has four urgent care centers for your convenience.      [email or web address] to reserve your spot online an avoid wait times  [facility] [address] [place], [place] [address] Modified hours of operation: Monday-Friday, 12 PM to 6 PM  Saturday & Sunday 10 AM to 4 PM *Across the street from [place] (New Address!) [address] [place], [place] [address] *Just off [address], across the road from [place] hours of operation: Monday-Friday, 12 PM to 6 PM  Closed Saturday & Sunday   The following sites will take your insurance:  . [facility] [place] a Provider at this Location  [address] [place], [place] [address] . 10 am to 8 pm Monday-Friday . 12 pm to 8 pm Saturday-Sunday   . [facility] at [place] a Provider at this Location  [address] [place], [place], [place] [address] . 8 am to 8 pm Monday-Friday . 9 am to 6 pm Saturday . 11 am  to 6 pm Sunday   . [facility] at [place] Get Driving Directions  [address] [place], [place] [address] . 8 am to 8 pm Monday-Friday . 8 am to 4 pm Saturday-Sunday   Your e-visit answers were reviewed by a board certified advanced clinical practitioner to complete your personal care plan.  Thank you for using e-Visits.

## 2018-11-10 ENCOUNTER — Ambulatory Visit: Payer: 59 | Admitting: Neurology

## 2018-11-10 ENCOUNTER — Encounter

## 2018-11-10 ENCOUNTER — Other Ambulatory Visit: Payer: Self-pay | Admitting: Family Medicine

## 2018-11-12 ENCOUNTER — Other Ambulatory Visit: Payer: Self-pay | Admitting: Family Medicine

## 2018-11-24 ENCOUNTER — Other Ambulatory Visit: Payer: Self-pay | Admitting: Family Medicine

## 2018-12-02 ENCOUNTER — Other Ambulatory Visit: Payer: Self-pay

## 2018-12-02 ENCOUNTER — Encounter: Payer: Self-pay | Admitting: Family Medicine

## 2018-12-02 ENCOUNTER — Other Ambulatory Visit: Payer: Self-pay | Admitting: Family Medicine

## 2018-12-02 MED ORDER — PANTOPRAZOLE SODIUM 40 MG PO TBEC: 40.0000 mg | DELAYED_RELEASE_TABLET | Freq: Every day | ORAL | 0 refills | Status: DC

## 2018-12-10 ENCOUNTER — Other Ambulatory Visit: Payer: Self-pay | Admitting: Family Medicine

## 2018-12-10 NOTE — Telephone Encounter (Signed)
Please call and have patient schedule an appointment.  We can certainly make it a virtual appointment she has not been seen in a year in our office.  So to continue with additional refills she needs to make a follow-up appointment for medications and chronic medical problems.

## 2018-12-11 NOTE — Telephone Encounter (Signed)
Called patient and notified of MD instructions. Call back number provided to schedule appointment. KG LPN

## 2018-12-30 ENCOUNTER — Other Ambulatory Visit: Payer: Self-pay | Admitting: Family Medicine

## 2018-12-30 ENCOUNTER — Telehealth (INDEPENDENT_AMBULATORY_CARE_PROVIDER_SITE_OTHER): Payer: 59 | Admitting: Family Medicine

## 2018-12-30 ENCOUNTER — Encounter: Payer: Self-pay | Admitting: Family Medicine

## 2018-12-30 DIAGNOSIS — R51 Headache: Secondary | ICD-10-CM | POA: Diagnosis not present

## 2018-12-30 DIAGNOSIS — R519 Headache, unspecified: Secondary | ICD-10-CM

## 2018-12-30 DIAGNOSIS — R202 Paresthesia of skin: Secondary | ICD-10-CM

## 2018-12-30 MED ORDER — PANTOPRAZOLE SODIUM 40 MG PO TBEC: 40.0000 mg | DELAYED_RELEASE_TABLET | Freq: Every day | ORAL | 3 refills | Status: DC

## 2018-12-30 MED ORDER — AMITRIPTYLINE HCL 25 MG PO TABS: ORAL_TABLET | ORAL | 12 refills | Status: DC

## 2018-12-30 MED ORDER — FLUTICASONE PROPIONATE 50 MCG/ACT NA SUSP: NASAL | 12 refills | Status: AC

## 2018-12-30 NOTE — Progress Notes (Signed)
Virtual Visit  via Video Note  I connected with      Kelsey [name] by a video enabled telemedicine application and verified that I am speaking with the correct person using two identifiers.   I discussed the limitations of evaluation and management by telemedicine and the availability of in person appointments. The patient expressed understanding and agreed to proceed.  History of Present Illness: Kelsey [name] is a 38 y.o. female who would like to discuss headache and tingling sensation on scalp.  Patient has chronic headaches and has been referred to headache specialist in the past.  She is currently taking amitriptyline which helps some.  However she continues to have significant headaches.  She notes she has neck pain this is some of these headaches at times and notes that if she sleeps awkwardly she will have worsening headaches.  Next Additionally she has a tingly sensation under her scalp.  She cannot see any rash.  She is tried multiple different topical over-the-counter and prescription medications including ketoconazole cream and which have not helped.    Observations/Objective: There were no vitals taken for this visit. Wt Readings from Last 5 Encounters:  07/22/18 138 lb 14.2 oz (63 kg)  06/05/18 139 lb (63 kg)  04/08/18 140 lb (63.5 kg)  03/20/18 142 lb (64.4 kg)  11/19/17 139 lb (63 kg)   Exam: Appearance Normal Speech.    Lab and Radiology Results No results found for this or any previous visit (from the past 72 hour(s)). No results found.   Assessment and Plan: 38 y.o. female with  Headache.  Patient does have a chronic headache disorder.  However based on her description of somewhat concerned she may be having occipital neuralgia.  We discussed options.  Patient likely will require follow-up visit in person to reassess.  Additionally she may be a good candidate for medication such as Aimovig or Emgality into the future.  I have provided information about  this and patient will do some reading and research.  Scalp tingling: Also somewhat unclear.  This really will require a in person assessment.  Certainly could be related to occipital neuralgia or some dermatologic condition that she just cannot see.  Recheck in person in near future.  Chronic medications refilled.  PDMP not reviewed this encounter. No orders of the defined types were placed in this encounter.  Meds ordered this encounter  Medications  . amitriptyline (ELAVIL) 25 MG tablet    Sig: TAKE 1/2 TABLET BY MOUTH EVERY DAY AT BEDTIME    Dispense:  45 tablet    Refill:  12  . fluticasone (FLONASE) 50 MCG/ACT nasal spray    Sig: SPRAY 2 SPRAYS INTO EACH NOSTRIL EVERY DAY    Dispense:  16 mL    Refill:  12  . pantoprazole (PROTONIX) 40 MG tablet    Sig: Take 1 tablet (40 mg total) by mouth daily.    Dispense:  90 tablet    Refill:  3    Follow Up Instructions:    I discussed the assessment and treatment plan with the patient. The patient was provided an opportunity to ask questions and all were answered. The patient agreed with the plan and demonstrated an understanding of the instructions.   The patient was advised to call back or seek an in-person evaluation if the symptoms worsen or if the condition fails to improve as anticipated.  Time: 15 minutes of intraservice time, with >22 minutes of total time during today's visit.  Historical information moved to improve visibility of documentation.  Past Medical History:  Diagnosis Date  . Anxiety and depression   . Asthma   . Depression   . Endometriosis   . Frequent headaches   . GERD (gastroesophageal reflux disease)   . Hx of varicella   . IBS (irritable bowel syndrome)   . Migraines   . Panic attacks   . Postpartum care following cesarean delivery (2/18) 07/08/2015  . Vertigo 12/31/2013   Past Surgical History:  Procedure Laterality Date  . CESAREAN SECTION N/A 07/08/2015   Procedure: CESAREAN SECTION;   Surgeon: Noland FordyceKelly Fogleman, MD;  Location: [facility];  Service: Obstetrics;  Laterality: N/A;  . NO PAST SURGERIES     Social History   Tobacco Use  . Smoking status: Never Smoker  . Smokeless tobacco: Never Used  Substance Use Topics  . Alcohol use: No    Alcohol/week: 0.0 standard drinks   family history includes Cancer in her maternal grandmother and mother; Hypertension in her father and mother; Stroke in her father.  Medications: Current Outpatient Medications  Medication Sig Dispense Refill  . albuterol (PROVENTIL HFA;VENTOLIN HFA) 108 (90 Base) MCG/ACT inhaler INHALE 2 PUFFS INTO THE LUNGS EVERY 4 (FOUR) HOURS AS NEEDED FOR WHEEZING. 6.7 Inhaler 1  . amitriptyline (ELAVIL) 25 MG tablet TAKE 1/2 TABLET BY MOUTH EVERY DAY AT BEDTIME 45 tablet 12  . BIOTIN PO Take by mouth.    . diclofenac sodium (VOLTAREN) 1 % GEL Apply 4 g topically 4 (four) times daily. To affected joint. 100 g 11  . fluticasone (FLONASE) 50 MCG/ACT nasal spray SPRAY 2 SPRAYS INTO EACH NOSTRIL EVERY DAY 16 mL 12  . hyoscyamine (LEVSIN, ANASPAZ) 0.125 MG tablet Take one by mouth every 4-6 hours as needed for abdominal cramping or pain 15 tablet 0  . JUNEL 1/20 1-20 MG-MCG tablet TAKE 1 (ONE) TABLET DAILY CONTINUOUS ACTIVE PILLS    . meclizine (ANTIVERT) 25 MG tablet Take 1 tablet (25 mg total) by mouth 3 (three) times daily as needed for dizziness. 30 tablet 0  . norethindrone (MICRONOR,CAMILA,ERRIN) 0.35 MG tablet Take by mouth.    . ondansetron (ZOFRAN-ODT) 4 MG disintegrating tablet Take 1 tablet (4 mg total) by mouth every 8 (eight) hours as needed for nausea or vomiting. 5 tablet 0  . pantoprazole (PROTONIX) 40 MG tablet Take 1 tablet (40 mg total) by mouth daily. 90 tablet 3  . Prenatal Vit-Fe Fumarate-FA (PRENATAL MULTIVITAMIN) TABS tablet Take 1 tablet by mouth daily at 12 noon.    . Riboflavin 400 MG TABS Take by mouth.    . rizatriptan (MAXALT-MLT) 10 MG disintegrating tablet Take  1 tablet at the onset of  your headache, repeat a dose in 2 hours if you need more relief. Do not use more than 3 days a week. (Patient not taking: Reported on 03/20/2018) 10 tablet 12  . topiramate (TOPAMAX) 100 MG tablet One half tab by mouth daily for a week, then one tab by mouth daily. (Patient not taking: Reported on 03/20/2018) 90 tablet 1   No current facility-administered medications for this visit.    Allergies  Allergen Reactions  . Catfish [Fish Allergy] Shortness Of Breath  . Fish-Derived Products Shortness Of Breath  . Pineapple Hives  . Citric Acid     Per patient report  . Tomato Flavor  [Flavoring Agent]     Mouth sores  . Phenergan [Promethazine Hcl] Other (See Comments)    Restless motion

## 2018-12-30 NOTE — Patient Instructions (Addendum)
Thank you for coming in today.  Read about Emgality and Aimovig  Also consider occipital neuralgia  Recommend in person visit to follow these issues up if not improving    Occipital Neuralgia  Occipital neuralgia is a type of headache that causes brief episodes of very bad pain in the back of your head. Pain from occipital neuralgia may spread (radiate) to other parts of your head. These headaches may be caused by irritation of the nerves that leave your spinal cord high up in your neck, just below the base of your skull (occipital nerves). Your occipital nerves transmit sensations from the back of your head, the top of your head, and the areas behind your ears. What are the causes? This condition can occur without any known cause (primary headache syndrome). In other cases, this condition is caused by pressure on or irritation of one of the two occipital nerves. Pressure and irritation may be due to:  Muscle spasm in the neck.  Neck injury.  Wear and tear of the vertebrae in the neck (osteoarthritis).  Disease of the disks that separate the vertebrae.  Swollen blood vessels that put pressure on the occipital nerves.  Infections.  Tumors.  Diabetes. What are the signs or symptoms? This condition causes brief burning, stabbing, electric, shocking, or shooting pain which can radiate to the top of the head. It can happen on one side or both sides of the head. It can also cause:  Pain behind the eye.  Pain triggered by neck movement or hair brushing.  Scalp tenderness.  Aching in the back of the head between episodes of very bad pain.  Pain gets worse with exposure to bright lights. How is this diagnosed? There is no test that diagnoses this condition. Your health care provider may diagnose this condition based on a physical exam and your symptoms. Other tests may be done, such as:  Imaging studies of the brain and neck (cervical spine), such as an MRI or CT scan. These look  for causes of pinched nerves.  Applying pressure to the nerves in the neck to try to re-create the pain.  Injection of numbing medicine into the occipital nerve areas to see if pain goes away (diagnostic nerve block). How is this treated? Treatment for this condition may begin with simple measures, such as:  Rest.  Massage.  Applying heat or cold on the area.  Over-the-counter pain relievers. If these measures do not work, you may need other treatments, including:  Medicines, such as: ? Prescription-strength anti-inflammatory medicines. ? Muscle relaxants. ? Anti-seizure medicines, which can relieve pain. ? Antidepressants, which can relieve pain. ? Injected medicines, such as medicines that numb the area (local anesthetic) and steroids.  Pulsed radiofrequency ablation. This is when wires are implanted to deliver electrical impulses that block pain signals from the occipital nerve.  Surgery to relieve nerve pressure.  Physical therapy. Follow these instructions at home: Pain management      Avoid any activities that cause pain.  Rest when you have an attack of pain.  Try gentle massage to relieve pain.  Try a different pillow or sleeping position.  If directed, apply heat to the affected area as told by your health care provider. Use the heat source that your health care provider recommends, such as a moist heat pack or a heating pad. ? Place a towel between your skin and the heat source. ? Leave the heat on for 20-30 minutes. ? Remove the heat if your skin  turns bright red. This is especially important if you are unable to feel pain, heat, or cold. You may have a greater risk of getting burned.  If directed, apply ice to the back of the head and neck area as told by your health care provider. ? Put ice in a plastic bag. ? Place a towel between your skin and the bag. ? Leave the ice on for 20 minutes, 2-3 times per day. General instructions  Take over-the-counter  and prescription medicines only as told by your health care provider.  Avoid things that make your symptoms worse, such as bright lights.  Try to stay active. Get regular exercise that does not cause pain. Ask your health care provider to suggest safe exercises for you.  Work with a physical therapist to learn stretching exercises you can do at home.  Practice good posture.  Keep all follow-up visits as told by your health care provider. This is important. Contact a health care provider if:  Your medicine is not working.  You have new or worsening symptoms. Get help right away if:  You have very bad head pain that does not go away.  You have a sudden change in vision, balance, or speech. Summary  Occipital neuralgia is a type of headache that causes brief episodes of very bad pain in the back of your head.  Pain from occipital neuralgia may spread (radiate) to other parts of your head.  Treatment for this condition includes rest, massage, and medicines. This information is not intended to replace advice given to you by your health care provider. Make sure you discuss any questions you have with your health care provider. Document Released: 04/30/2001 Document Revised: 04/22/2017 Document Reviewed: 07/11/2016 Elsevier Patient Education  2020 [place] injection What is this medicine? GALCANEZUMAB (gal ka NEZ ue mab) is used to prevent migraines and treat cluster headaches. This medicine may be used for other purposes; ask your health care provider or pharmacist if you have questions. COMMON BRAND NAME(S): Emgality What should I tell my health care provider before I take this medicine? They need to know if you have any of these conditions:  an unusual or allergic reaction to galcanezumab, other medicines, foods, dyes, or preservatives  pregnant or trying to get pregnant  breast-feeding How should I use this medicine? This medicine is for injection  under the skin. You will be taught how to prepare and give this medicine. Use exactly as directed. Take your medicine at regular intervals. Do not take your medicine more often than directed. It is important that you put your used needles and syringes in a special sharps container. Do not put them in a trash can. If you do not have a sharps container, call your pharmacist or healthcare provider to get one. Talk to your pediatrician regarding the use of this medicine in children. Special care may be needed. Overdosage: If you think you have taken too much of this medicine contact a poison control center or emergency room at once. NOTE: This medicine is only for you. Do not share this medicine with others. What if I miss a dose? If you miss a dose, take it as soon as you can. If it is almost time for your next dose, take only that dose. Do not take double or extra doses. What may interact with this medicine? Interactions are not expected. This list may not describe all possible interactions. Give your health care provider a list of all the  medicines, herbs, non-prescription drugs, or dietary supplements you use. Also tell them if you smoke, drink alcohol, or use illegal drugs. Some items may interact with your medicine. What should I watch for while using this medicine? Tell your doctor or healthcare professional if your symptoms do not start to get better or if they get worse. What side effects may I notice from receiving this medicine? Side effects that you should report to your doctor or health care professional as soon as possible:  allergic reactions like skin rash, itching or hives, swelling of the face, lips, or tongue Side effects that usually do not require medical attention (report these to your doctor or health care professional if they continue or are bothersome):  pain, redness, or irritation at site where injected This list may not describe all possible side effects. Call your doctor  for medical advice about side effects. You may report side effects to FDA at 1-800-FDA-1088. Where should I keep my medicine? Keep out of the reach of children. You will be instructed on how to store this medicine. Throw away any unused medicine after the expiration date on the label. NOTE: This sheet is a summary. It may not cover all possible information. If you have questions about this medicine, talk to your doctor, pharmacist, or health care provider.  2020 Elsevier/Gold Standard (2017-10-22 12:03:23)   Beckie BusingErenumab injection What is this medicine? ERENUMAB (e REN ue mab) is used to prevent migraine headaches. This medicine may be used for other purposes; ask your health care provider or pharmacist if you have questions. COMMON BRAND NAME(S): Aimovig What should I tell my health care provider before I take this medicine? They need to know if you have any of these conditions:  an unusual or allergic reaction to erenumab, latex, other medicines, foods, dyes, or preservatives  high blood pressure  pregnant or trying to get pregnant  breast-feeding How should I use this medicine? This medicine is for injection under the skin. You will be taught how to prepare and give this medicine. Use exactly as directed. Take your medicine at regular intervals. Do not take your medicine more often than directed. It is important that you put your used needles and syringes in a special sharps container. Do not put them in a trash can. If you do not have a sharps container, call your pharmacist or healthcare provider to get one. Talk to your pediatrician regarding the use of this medicine in children. Special care may be needed. Overdosage: If you think you have taken too much of this medicine contact a poison control center or emergency room at once. NOTE: This medicine is only for you. Do not share this medicine with others. What if I miss a dose? If you miss a dose, take it as soon as you can. If it is  almost time for your next dose, take only that dose. Do not take double or extra doses. What may interact with this medicine? Interactions are not expected. This list may not describe all possible interactions. Give your health care provider a list of all the medicines, herbs, non-prescription drugs, or dietary supplements you use. Also tell them if you smoke, drink alcohol, or use illegal drugs. Some items may interact with your medicine. What should I watch for while using this medicine? Tell your doctor or healthcare professional if your symptoms do not start to get better or if they get worse. What side effects may I notice from receiving this medicine? Side effects that  you should report to your doctor or health care professional as soon as possible:  allergic reactions like skin rash, itching or hives, swelling of the face, lips, or tongue  chest pain  fast, irregular heartbeat  feeling faint or lightheaded  palpitations Side effects that usually do not require medical attention (report these to your doctor or health care professional if they continue or are bothersome):  constipation  muscle cramps  pain, redness, or irritation at site where injected This list may not describe all possible side effects. Call your doctor for medical advice about side effects. You may report side effects to FDA at 1-800-FDA-1088. Where should I keep my medicine? Keep out of the reach of children. You will be instructed on how to store this medicine. Throw away any unused medicine after the expiration date on the label. NOTE: This sheet is a summary. It may not cover all possible information. If you have questions about this medicine, talk to your doctor, pharmacist, or health care provider.  2020 Elsevier/Gold Standard (2018-09-21 15:43:58)

## 2019-01-14 ENCOUNTER — Ambulatory Visit (INDEPENDENT_AMBULATORY_CARE_PROVIDER_SITE_OTHER): Payer: 59 | Admitting: Family Medicine

## 2019-01-14 VITALS — BP 131/78 | HR 88 | Temp 98.3°F | Ht 65.0 in | Wt 142.0 lb

## 2019-01-14 DIAGNOSIS — R51 Headache: Secondary | ICD-10-CM

## 2019-01-14 DIAGNOSIS — L299 Pruritus, unspecified: Secondary | ICD-10-CM

## 2019-01-14 DIAGNOSIS — R519 Headache, unspecified: Secondary | ICD-10-CM

## 2019-01-14 DIAGNOSIS — Z23 Encounter for immunization: Secondary | ICD-10-CM | POA: Diagnosis not present

## 2019-01-14 DIAGNOSIS — Z5181 Encounter for therapeutic drug level monitoring: Secondary | ICD-10-CM | POA: Diagnosis not present

## 2019-01-14 LAB — COMPLETE METABOLIC PANEL WITH GFR
AG Ratio: 1.5 (calc) (ref 1.0–2.5)
ALT: 11 U/L (ref 6–29)
AST: 15 U/L (ref 10–30)
Albumin: 4.2 g/dL (ref 3.6–5.1)
Alkaline phosphatase (APISO): 34 U/L (ref 31–125)
BUN: 15 mg/dL (ref 7–25)
CO2: 28 mmol/L (ref 20–32)
Calcium: 9.6 mg/dL (ref 8.6–10.2)
Chloride: 104 mmol/L (ref 98–110)
Creat: 0.93 mg/dL (ref 0.50–1.10)
GFR, Est African American: 92 mL/min/{1.73_m2} (ref >=60)
GFR, Est Non African American: 79 mL/min/{1.73_m2} (ref >=60)
Globulin: 2.8 g/dL (calc) (ref 1.9–3.7)
Glucose, Bld: 83 mg/dL (ref 65–99)
Potassium: 4.5 mmol/L (ref 3.5–5.3)
Sodium: 138 mmol/L (ref 135–146)
Total Bilirubin: 0.6 mg/dL (ref 0.2–1.2)
Total Protein: 7 g/dL (ref 6.1–8.1)

## 2019-01-14 LAB — CBC
HCT: 42.9 % (ref 35.0–45.0)
Hemoglobin: 14.3 g/dL (ref 11.7–15.5)
MCH: 29.2 pg (ref 27.0–33.0)
MCHC: 33.3 g/dL (ref 32.0–36.0)
MCV: 87.6 fL (ref 80.0–100.0)
MPV: 11 fL (ref 7.5–12.5)
Platelets: 235 10*3/uL (ref 140–400)
RBC: 4.9 10*6/uL (ref 3.80–5.10)
RDW: 12.2 % (ref 11.0–15.0)
WBC: 3.9 10*3/uL (ref 3.8–10.8)

## 2019-01-14 MED ORDER — AIMOVIG 70 MG/ML ~~LOC~~ SOAJ: 70.0000 mg | SUBCUTANEOUS | 12 refills | Status: DC

## 2019-01-14 MED ORDER — TERBINAFINE HCL 250 MG PO TABS: 250.0000 mg | ORAL_TABLET | Freq: Every day | ORAL | 0 refills | Status: AC

## 2019-01-14 NOTE — Patient Instructions (Signed)
Thank you for coming in today. Take terbinafine for 1 month for the possibility that you have fungal infection on your scalp.  If the itching does not improve let me know.   We are also starting Aimovig for migraine prevention.  If you feel confident on how do the injection yoruself, you can do it yourself.  If you want some teaching ok to schedule a nurse visit and bring you Aimovig with you to learn how to do the injection.   Erenumab injection What is this medicine? ERENUMAB (e REN ue mab) is used to prevent migraine headaches. This medicine may be used for other purposes; ask your health care provider or pharmacist if you have questions. COMMON BRAND NAME(S): Aimovig What should I tell my health care provider before I take this medicine? They need to know if you have any of these conditions:  an unusual or allergic reaction to erenumab, latex, other medicines, foods, dyes, or preservatives  high blood pressure  pregnant or trying to get pregnant  breast-feeding How should I use this medicine? This medicine is for injection under the skin. You will be taught how to prepare and give this medicine. Use exactly as directed. Take your medicine at regular intervals. Do not take your medicine more often than directed. It is important that you put your used needles and syringes in a special sharps container. Do not put them in a trash can. If you do not have a sharps container, call your pharmacist or healthcare provider to get one. Talk to your pediatrician regarding the use of this medicine in children. Special care may be needed. Overdosage: If you think you have taken too much of this medicine contact a poison control center or emergency room at once. NOTE: This medicine is only for you. Do not share this medicine with others. What if I miss a dose? If you miss a dose, take it as soon as you can. If it is almost time for your next dose, take only that dose. Do not take double or extra  doses. What may interact with this medicine? Interactions are not expected. This list may not describe all possible interactions. Give your health care provider a list of all the medicines, herbs, non-prescription drugs, or dietary supplements you use. Also tell them if you smoke, drink alcohol, or use illegal drugs. Some items may interact with your medicine. What should I watch for while using this medicine? Tell your doctor or healthcare professional if your symptoms do not start to get better or if they get worse. What side effects may I notice from receiving this medicine? Side effects that you should report to your doctor or health care professional as soon as possible:  allergic reactions like skin rash, itching or hives, swelling of the face, lips, or tongue  chest pain  fast, irregular heartbeat  feeling faint or lightheaded  palpitations Side effects that usually do not require medical attention (report these to your doctor or health care professional if they continue or are bothersome):  constipation  muscle cramps  pain, redness, or irritation at site where injected This list may not describe all possible side effects. Call your doctor for medical advice about side effects. You may report side effects to FDA at 1-800-FDA-1088. Where should I keep my medicine? Keep out of the reach of children. You will be instructed on how to store this medicine. Throw away any unused medicine after the expiration date on the label. NOTE: This sheet is  a summary. It may not cover all possible information. If you have questions about this medicine, talk to your doctor, pharmacist, or health care provider.  2020 Elsevier/Gold Standard (2018-09-21 15:43:58)  Terbinafine tablets What is this medicine? TERBINAFINE (TER bin a feen) is an antifungal medicine. It is used to treat certain kinds of fungal or yeast infections. This medicine may be used for other purposes; ask your health care  provider or pharmacist if you have questions. COMMON BRAND NAME(S): Lamisil, Terbinex What should I tell my health care provider before I take this medicine? They need to know if you have any of these conditions:  drink alcoholic beverages  kidney disease  liver disease  an unusual or allergic reaction to terbinafine, other medicines, foods, dyes, or preservatives  pregnant or trying to get pregnant  breast-feeding How should I use this medicine? Take this medicine by mouth with a full glass of water. Follow the directions on the prescription label. You can take this medicine with food or on an empty stomach. Take your medicine at regular intervals. Do not take your medicine more often than directed. Do not skip doses or stop your medicine early even if you feel better. Do not stop taking except on your doctor's advice. Talk to your pediatrician regarding the use of this medicine in children. Special care may be needed. Overdosage: If you think you have taken too much of this medicine contact a poison control center or emergency room at once. NOTE: This medicine is only for you. Do not share this medicine with others. What if I miss a dose? If you miss a dose, take it as soon as you can. If it is almost time for your next dose, take only that dose. Do not take double or extra doses. What may interact with this medicine? Do not take this medicine with any of the following medications:  thioridazine This medicine may also interact with the following medications:  beta-blockers  caffeine  cimetidine  cyclosporine  medicines for depression, anxiety, or psychotic disturbances  medicines for fungal infections like fluconazole and ketoconazole  medicines for irregular heartbeat like amiodarone, flecainide and propafenone  rifampin  warfarin This list may not describe all possible interactions. Give your health care provider a list of all the medicines, herbs, non-prescription  drugs, or dietary supplements you use. Also tell them if you smoke, drink alcohol, or use illegal drugs. Some items may interact with your medicine. What should I watch for while using this medicine? Visit your doctor or health care provider regularly. Tell your doctor right away if you have nausea or vomiting, loss of appetite, stomach pain on your right upper side, yellow skin, dark urine, light stools, or are over tired. Some fungal infections need many weeks or months of treatment to cure. If you are taking this medicine for a long time, you will need to have important blood work done. This medicine may cause serious skin reactions. They can happen weeks to months after starting the medicine. Contact your health care provider right away if you notice fevers or flu-like symptoms with a rash. The rash may be red or purple and then turn into blisters or peeling of the skin. Or, you might notice a red rash with swelling of the face, lips or lymph nodes in your neck or under your arms. What side effects may I notice from receiving this medicine? Side effects that you should report to your doctor or health care professional as soon as possible:  allergic reactions like skin rash or hives, swelling of the face, lips, or tongue  changes in vision  dark urine  fever or infection  general ill feeling or flu-like symptoms  light-colored stools  loss of appetite, nausea  rash, fever, and swollen lymph nodes  redness, blistering, peeling or loosening of the skin, including inside the mouth  right upper belly pain  unusually weak or tired  yellowing of the eyes or skin Side effects that usually do not require medical attention (report to your doctor or health care professional if they continue or are bothersome):  changes in taste  diarrhea  hair loss  muscle or joint pain  stomach gas  stomach upset This list may not describe all possible side effects. Call your doctor for medical  advice about side effects. You may report side effects to FDA at 1-800-FDA-1088. Where should I keep my medicine? Keep out of the reach of children. Store at room temperature below 25 degrees C (77 degrees F). Protect from light. Throw away any unused medicine after the expiration date. NOTE: This sheet is a summary. It may not cover all possible information. If you have questions about this medicine, talk to your doctor, pharmacist, or health care provider.  2020 Elsevier/Gold Standard (2018-08-14 15:37:07)   I will be moving [place] full [place] Sports [place] starting on November 1st.  You will still be able to see me for your Sports Medicine or Orthopedic needs in the [facility] Location. I will still be part of [facility].   [facility] [address] [place], [place] [address] [phone]  Telephone (phone line will be functional starting in November).  [phone] Fax [phone] Concussion Line  If you want to stay locally for your Sports Medicine issues Dr. Benjamin Stainhekkekandam [place] in [place] will be happy to see you.  Additionally Dr. Clare GandyJeremy Schmitz at [facility] will be happy to see you for sports medicine issues more locally.   For your primary care needs you are welcome to establish care with Dr. Sunnie NielsenNatalie Alexander.  We are working quickly to hire more physicians to cover the primary care needs however if you cannot get an appointment with Dr. Lyn HollingsheadAlexander in a timely manner [facility] has locations and openings for primary care services nearby.   [facility] at [facility] [address] . [place] , [address] [place] Main Line: [phone] . Behavioral Medicine: [phone] . Fax: [phone]  [facility] [address] . [place], [place] Main Line: [phone] . Behavioral Medicine: [phone] . Fax: [phone] . Hours (M-F): 7am - Armed forces operational officer5pm  [facility]  [place] [address] . [place], [place]: [phone] . Behavioral Medicine: [phone] . Fax: [phone] . Hours (M-F): 8am - Curator5pm   [facility] [address] [place], [place] Phone: [phone] . Behavioral Medicine: [phone] . Fax: [phone]

## 2019-01-14 NOTE — Progress Notes (Signed)
Kelsey DickKimberly [name] is a 38 y.o. female who [facility] today for follow-up scalp itching and discuss chronic headaches.  [name] had a video visit with me on August 12.  At that time she noted that she been having several months of scalp itching.  She had tried multiple over-the-counter medications.  After discussion we decided that it was reasonable for her to proceed to in person visit for physical exam.  Differential included possible fungal infection of scalp as well as possible occipital neuralgia.  Additionally she notes that she been having chronic migraine headaches.  She has been seen by headache specialist and is currently on amitriptyline.  In the past she had tried Topamax which you found intolerable due to dizziness.  She notes that she still has headaches pretty frequently.  She has more than 4 migraine days per month.  Her headaches are bad enough that they do interfere with her ability to work at times.  We discussed possibility of using Aimovig or Emgality and she is interested in that.   ROS as above:  Exam:  BP 131/78   Pulse 88   Temp 98.3 F (36.8 C) (Oral)   Ht 5\' 5"  (1.651 m)   Wt 142 lb (64.4 kg)   SpO2 100%   BMI 23.63 kg/m  Wt Readings from Last 5 Encounters:  01/14/19 142 lb (64.4 kg)  07/22/18 138 lb 14.2 oz (63 kg)  06/05/18 139 lb (63 kg)  04/08/18 140 lb (63.5 kg)  03/20/18 142 lb (64.4 kg)    Gen: Well NAD HEENT: EOMI,  MMM no scalp lesions are visible.  Right posterior cervical lymph node is palpable but not enlarged or tender.  Patient has no tenderness to palpation along spinal midline or paraspinal musculature of cervical spine.  She is not tender at the base of the skull near occipital nerve.  Normal neck motion. Lungs: Normal work of breathing. CTABL Heart: RRR no MRG Abd: NABS, Soft. Nondistended, Nontender Exts: Brisk  capillary refill, warm and well perfused.   Lab and Radiology Results No results found for this or any previous visit (from the past 72 hour(s)). No results found.    Assessment and Plan: 38 y.o. female with  Scalp irritation: Unclear etiology.  The scalp looks pretty normal to me however it is possible that she has a bit of tinea capitis that is not obviously visible.  I think it is reasonable to proceed with a trial of terbinafine.  Will treat for 1 month and reassess if needed.  We will check CBC metabolic panel prior to initiation of terbinafine for safety.  Additionally patient has chronic migraines that are not fully resolved or well controlled enough with amitriptyline.  She had failed previous trials of Topamax.  After discussion plan for Aimovig.  Reassess if not improving.  Flu vaccine given today prior to discharge.  I informed patient that I am transitioning to sports medicine only [facility] [place] starting in November.  Happy to see patient for continued sports medicine needs.  Discussed need for new PCP.  Provided some recommendations.   PDMP not reviewed this encounter. Orders Placed This Encounter  Procedures  . Flu Vaccine QUAD 36+ mos IM  . CBC  . COMPLETE METABOLIC PANEL WITH GFR   Meds ordered this encounter  Medications  . Erenumab-aooe (AIMOVIG) 70 MG/ML SOAJ    Sig: Inject 70 mg into the skin  every 30 (thirty) days.    Dispense:  1 pen    Refill:  12    Pt tried and failed topomax and amitrptline. Have 4 or more migraine days per month  . terbinafine (LAMISIL) 250 MG tablet    Sig: Take 1 tablet (250 mg total) by mouth daily.    Dispense:  30 tablet    Refill:  0     Historical information moved to improve visibility of documentation.  Past Medical History:  Diagnosis Date  . Anxiety and depression   . Asthma   . Depression   . Endometriosis   . Frequent headaches   . GERD (gastroesophageal reflux disease)   . Hx of  varicella   . IBS (irritable bowel syndrome)   . Migraines   . Panic attacks   . Postpartum care following cesarean delivery (2/18) 07/08/2015  . Vertigo 12/31/2013   Past Surgical History:  Procedure Laterality Date  . CESAREAN SECTION N/A 07/08/2015   Procedure: CESAREAN SECTION;  Surgeon: Aloha Gell, MD;  Location: [facility];  Service: Obstetrics;  Laterality: N/A;  . NO PAST SURGERIES     Social History   Tobacco Use  . Smoking status: Never Smoker  . Smokeless tobacco: Never Used  Substance Use Topics  . Alcohol use: No    Alcohol/week: 0.0 standard drinks   family history includes Cancer in her maternal grandmother and mother; Hypertension in her father and mother; Stroke in her father.  Medications: Current Outpatient Medications  Medication Sig Dispense Refill  . albuterol (PROVENTIL HFA;VENTOLIN HFA) 108 (90 Base) MCG/ACT inhaler INHALE 2 PUFFS INTO THE LUNGS EVERY 4 (FOUR) HOURS AS NEEDED FOR WHEEZING. 6.7 Inhaler 1  . amitriptyline (ELAVIL) 25 MG tablet TAKE 1/2 TABLET BY MOUTH EVERY DAY AT BEDTIME 45 tablet 12  . BIOTIN PO Take by mouth.    . diclofenac sodium (VOLTAREN) 1 % GEL Apply 4 g topically 4 (four) times daily. To affected joint. 100 g 11  . fluticasone (FLONASE) 50 MCG/ACT nasal spray SPRAY 2 SPRAYS INTO EACH NOSTRIL EVERY DAY 16 mL 12  . JUNEL 1/20 1-20 MG-MCG tablet TAKE 1 (ONE) TABLET DAILY CONTINUOUS ACTIVE PILLS    . meclizine (ANTIVERT) 25 MG tablet Take 1 tablet (25 mg total) by mouth 3 (three) times daily as needed for dizziness. 30 tablet 0  . ondansetron (ZOFRAN-ODT) 4 MG disintegrating tablet Take 1 tablet (4 mg total) by mouth every 8 (eight) hours as needed for nausea or vomiting. 5 tablet 0  . pantoprazole (PROTONIX) 40 MG tablet Take 1 tablet (40 mg total) by mouth daily. 90 tablet 3  . Prenatal Vit-Fe Fumarate-FA (PRENATAL MULTIVITAMIN) TABS tablet Take 1 tablet by mouth daily at 12 noon.    Eduard Roux (AIMOVIG) 70 MG/ML SOAJ Inject 70 mg  into the skin every 30 (thirty) days. 1 pen 12  . terbinafine (LAMISIL) 250 MG tablet Take 1 tablet (250 mg total) by mouth daily. 30 tablet 0   No current facility-administered medications for this visit.    Allergies  Allergen Reactions  . Catfish [Fish Allergy] Shortness Of Breath  . Fish-Derived Products Shortness Of Breath  . Pineapple Hives  . Citric Acid     Per patient report  . Tomato Flavor  [Flavoring Agent]     Mouth sores  . Phenergan [Promethazine Hcl] Other (See Comments)    Restless motion     Discussed warning signs or symptoms. Please see discharge instructions. Patient expresses understanding.

## 2019-02-12 ENCOUNTER — Encounter: Payer: Self-pay | Admitting: Family Medicine

## 2019-02-15 ENCOUNTER — Telehealth: Payer: Self-pay | Admitting: Family Medicine

## 2019-02-15 NOTE — Telephone Encounter (Signed)
Received a mychart from the patient that [facility] requires a PA. Information has been sent to the insurance company. Awaiting determination.

## 2019-02-19 NOTE — Telephone Encounter (Signed)
Received a fax that insurance was requiring additional information from insurance and waiting on a response. I have left a message for the patient that I am working on this for her in as quick a timeframe as I can.

## 2019-02-22 NOTE — Telephone Encounter (Signed)
I received a denial letter for the [place]. It does not give me a list of alternatives. I do not see what the patient has tried and failed in the past. Do you have any other recommendations? Please advise.

## 2019-02-23 NOTE — Telephone Encounter (Signed)
Double check with Kelsey [name].  The indication for this medication is 8 migraine days per month or more.  This would include days affected by migraine either before or after the migraine itself.  Can we double check with Kelsey [name] to see if she is having 8 migraine days per month.  If so we can clarify it in a phone note in this recent [employer] for reauthorization.

## 2019-02-24 NOTE — Telephone Encounter (Signed)
Amovig approved for the next 6 months.  Approval number: [id number]

## 2019-02-24 NOTE — Telephone Encounter (Signed)
I have faxed the appeal to insurance and waiting on a response.

## 2019-02-25 NOTE — Telephone Encounter (Signed)
I have called the pharmacy and let them know. I have left a message for the patient to call if she would like to set up a nurse visit for a teaching of Aimovig injections. Callback number was given.

## 2019-02-25 NOTE — Telephone Encounter (Signed)
Kelsey [name]. Looks like we got Aimovig approved.  Please contact patient and let me know if I need to send a new prescription to the pharmacy.   She may want to schedule a nurse visit to learn how to do the Aimovig injections.

## 2019-05-22 IMAGING — DX DG ANKLE COMPLETE 3+V*R*
3 series · 3 of 3 positions shown · non-contrast
Comparison: None.

CLINICAL DATA: Patient with ankle swelling. Unable to bear weight.
Initial encounter.

EXAM:
RIGHT ANKLE - COMPLETE 3+ VIEW

[ankle ap]
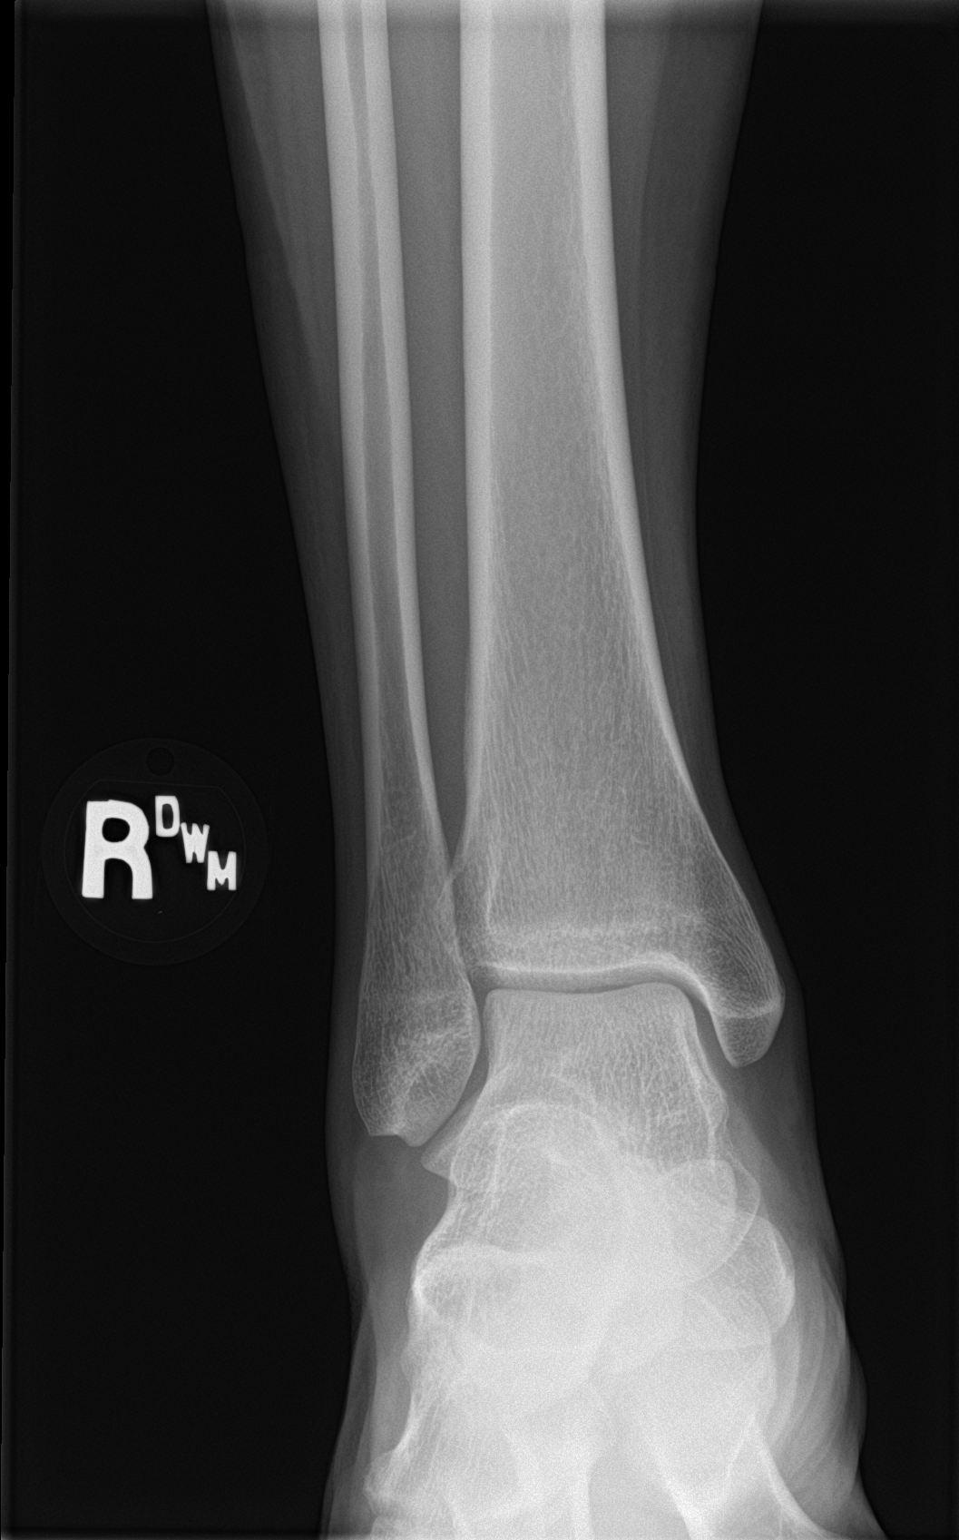

[ankle obl]
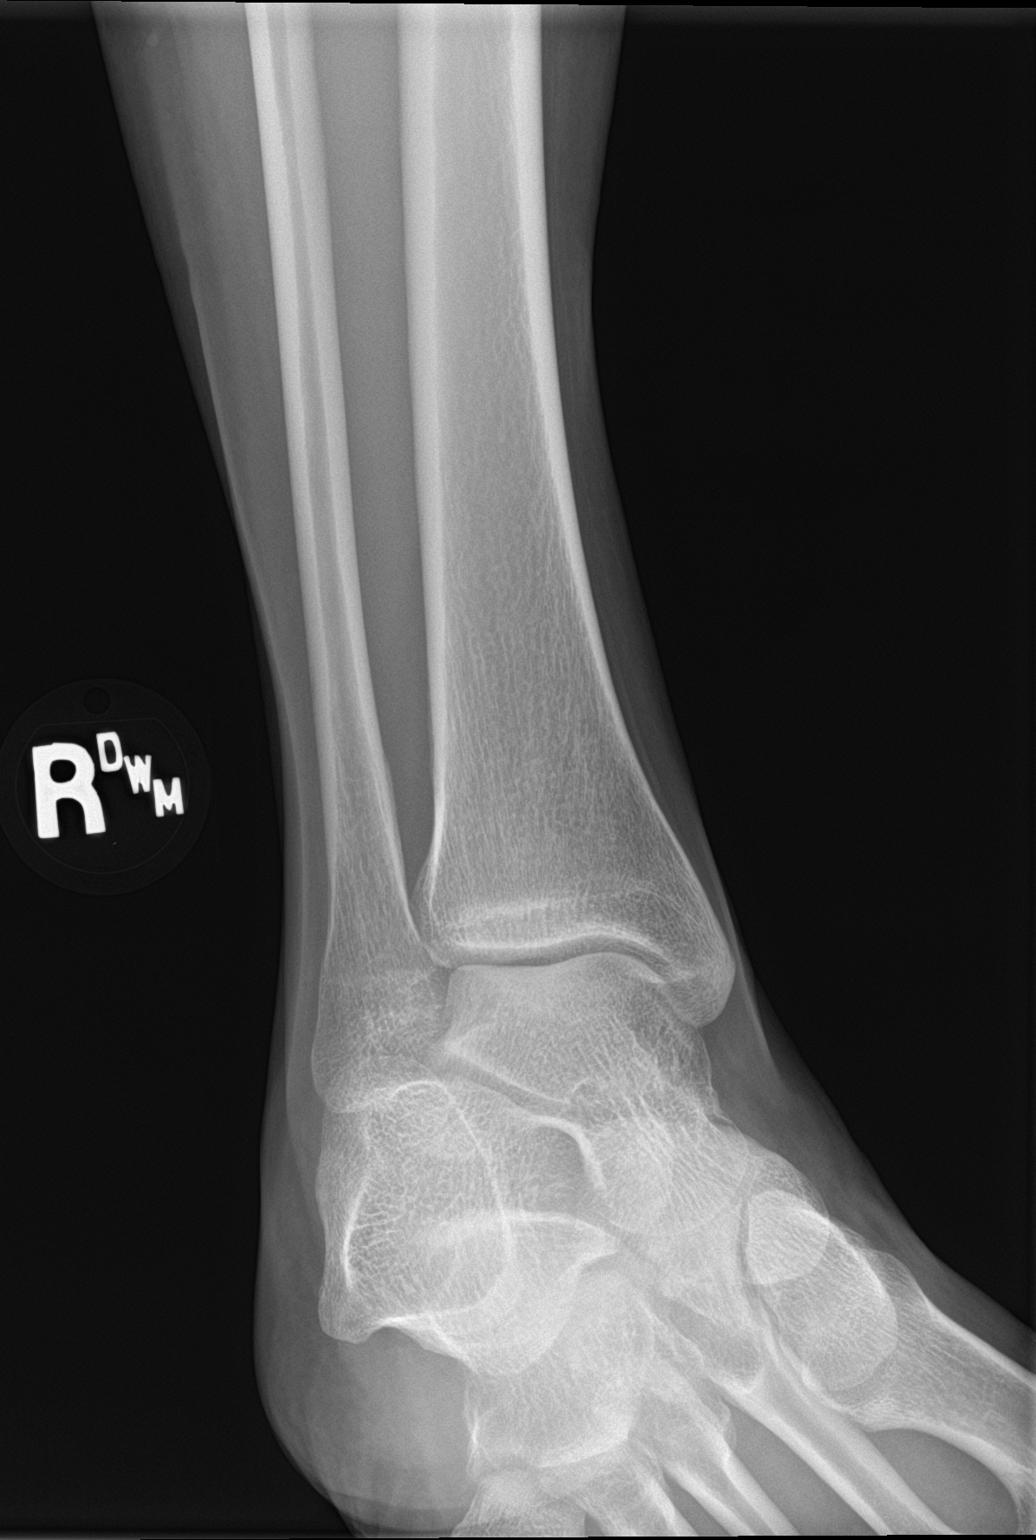

[ankle lat]
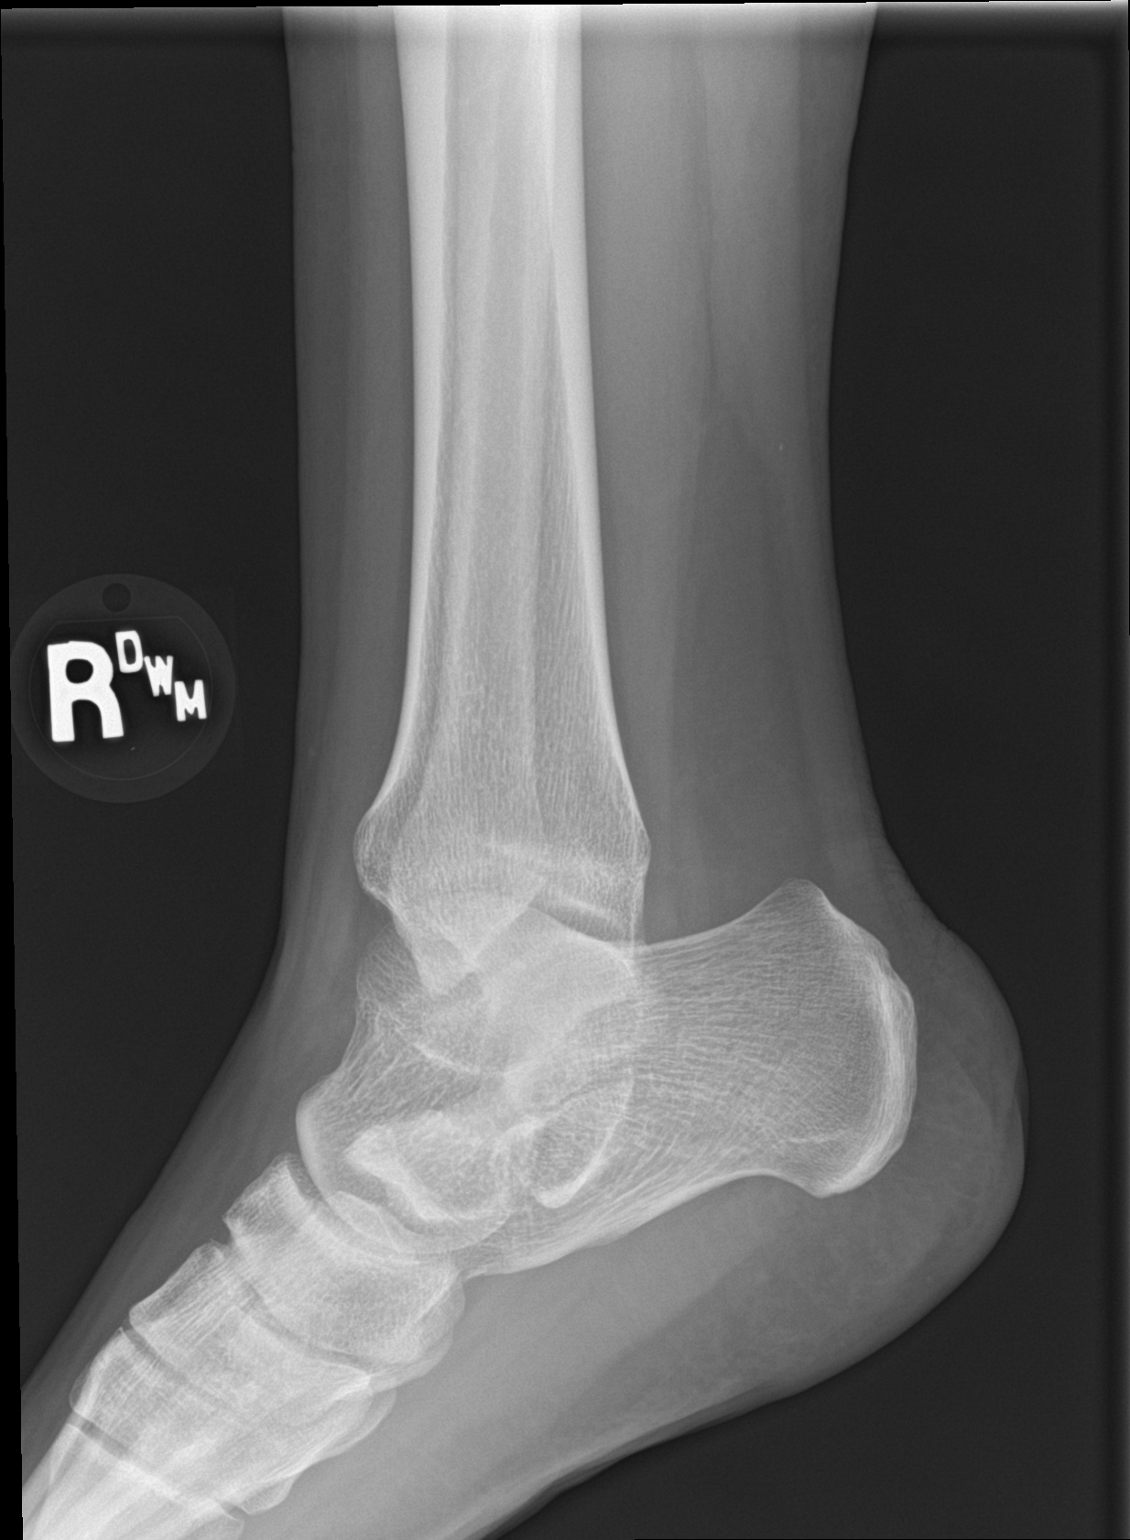

[3 of 3 positions shown; findings below may reference images not displayed]

FINDINGS: Normal anatomic alignment. No evidence for acute fracture or
dislocation. Regional soft tissues are unremarkable.
IMPRESSION: No acute osseous abnormality.

## 2019-06-01 ENCOUNTER — Encounter: Payer: Self-pay | Admitting: Medical-Surgical

## 2019-06-01 ENCOUNTER — Other Ambulatory Visit: Payer: Self-pay

## 2019-06-01 ENCOUNTER — Ambulatory Visit (INDEPENDENT_AMBULATORY_CARE_PROVIDER_SITE_OTHER): Payer: 59 | Admitting: Medical-Surgical

## 2019-06-01 VITALS — BP 111/74 | HR 70 | Temp 98.2°F | Ht 65.0 in | Wt 142.1 lb

## 2019-06-01 DIAGNOSIS — R519 Headache, unspecified: Secondary | ICD-10-CM

## 2019-06-01 DIAGNOSIS — Z30019 Encounter for initial prescription of contraceptives, unspecified: Secondary | ICD-10-CM

## 2019-06-01 MED ORDER — JUNEL 1/20 1-20 MG-MCG PO TABS: ORAL_TABLET | ORAL | 3 refills | Status: DC

## 2019-06-01 NOTE — Progress Notes (Signed)
Subjective:    CC: Birth-control refills, chronic headaches  HPI:  39 year old female presenting today to discuss refilling birth control, she has been on Junel continuous active pills prescribed by gynecology and would like to have her primary care take over the management for this prescription.  No missed doses, takes active pills continuously for 3 months then 7 days off.  Doing well on this medication but notes that she has worsening of her chronic headaches while she is on her 7 days off.  Does have some breakthrough bleeding every 2 to 3 months but her previously severe cramps have resolved.  Last Pap smear 2020.  Chronic headaches: Was previously prescribed Aimovig for use for chronic headaches but there were some issues with insurance approval.  Insurance approval finally obtained but pharmacy never filled the medication.  Does still have headaches but manages with nightly amitriptyline.  Recently started fasting with her church.  Reports that she stopped eating her nightly bowl of cereal and began exercising for 20 minutes before bed.  After she started this, she noticed that she was waking up in the middle of the night feeling shaky, nervous, lightheaded, with some nausea and a bad headache.  She did some research and thought this may be from hypoglycemia.  She discontinued the fasting and this is helped her symptoms.  I reviewed the past medical history, family history, social history, surgical history, and allergies today and no changes were needed.  Please see the problem list section below in epic for further details.  Past Medical History: Past Medical History:  Diagnosis Date  . Anxiety and depression   . Asthma   . Depression   . Endometriosis   . Frequent headaches   . GERD (gastroesophageal reflux disease)   . Hx of varicella   . IBS (irritable bowel syndrome)   . Migraines   . Panic attacks   . Postpartum care following cesarean delivery (2/18) 07/08/2015  . Vertigo  12/31/2013   Past Surgical History: Past Surgical History:  Procedure Laterality Date  . CESAREAN SECTION N/A 07/08/2015   Procedure: CESAREAN SECTION;  Surgeon: Aloha Gell, MD;  Location: [facility];  Service: Obstetrics;  Laterality: N/A;  . NO PAST SURGERIES     Social History: Social History   Socioeconomic History  . Marital status: Married    Spouse name: Not on file  . Number of children: 0  . Years of education: Not on file  . Highest education level: Not on file  Occupational History  . Not on file  Tobacco Use  . Smoking status: Never Smoker  . Smokeless tobacco: Never Used  Substance and Sexual Activity  . Alcohol use: No    Alcohol/week: 0.0 standard drinks  . Drug use: No  . Sexual activity: Yes    Birth control/protection: Pill  Other Topics Concern  . 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 Difficulty of Paying Living Expenses: Not on file  Food Insecurity:   . Worried About Charity fundraiser in the Last Year: Not on file  . Ran Out of  Food in the Last Year: Not on file  Transportation Needs:   . Lack of Transportation (Medical): Not on file  . Lack of Transportation (Non-Medical): Not on file  Physical Activity:   . Days of Exercise per Week: Not on file  . Minutes of Exercise per Session: Not on file  Stress:   . Feeling of Stress : Not on file  Social Connections:   . Frequency of Communication with Friends and Family: Not on file  . Frequency of Social Gatherings with Friends and Family: Not on file  . Attends Religious Services: Not on file  . Active Member of Clubs or Organizations: Not on file  . Attends Archivist Meetings: Not on file   . Marital Status: Not on file   Family History: Family History  Problem Relation Age of Onset  . Hypertension Mother   . Cancer Mother        multiple myeloma  . Hypertension Father   . Stroke Father   . Cancer Maternal Grandmother        CLL   Allergies: Allergies  Allergen Reactions  . Catfish [Fish Allergy] Shortness Of Breath  . Fish-Derived Products Shortness Of Breath  . Pineapple Hives  . Citric Acid     Per patient report  . Monosodium Glutamate     Headache  . Tomato Flavor  [Flavoring Agent]     Mouth sores  . Phenergan [Promethazine Hcl] Other (See Comments)    Restless motion   Medications: See med rec.  Review of Systems: No fevers, chills, night sweats, weight loss, chest pain, or shortness of breath.   Objective:    General: Well Developed, well nourished, and in no acute distress.  Neuro: Alert and oriented x3.  Steady gait, no muscle weakness. HEENT: Normocephalic, atraumatic.  Skin: Warm and dry. Cardiac: Regular rate and rhythm, no murmurs rubs or gallops, no lower extremity edema.  Respiratory: Clear to auscultation bilaterally. Not using accessory muscles, speaking in full sentences.   Impression and Recommendations:    Encounter for female birth control Exacerbation of chronic headaches appears to be related to timing of 7 days off of birth control pills.  Discussed other options for birth control at this time the patient would like to think about options and continue Junel for now.  Refills provided.  Chronic daily headache Continue nightly amitriptyline and Junel birth control as these seem to help manage headaches.  Avoid fasting if possible, eating a healthy diet and exercising regularly.  Avoid making abrupt changes in dietary practices to avoid hypoglycemia.  Consider using Aimovig if headaches not controlled by current management strategies.  Instructed to reach out to office if difficulty obtaining Aimovig.   Return in about 6 months  (around 11/29/2019) for annual physical exam and labs.  ___________________________________________ Clearnce Sorrel, DNP, APRN, FNP-BC [facility] [place]

## 2019-06-01 NOTE — Assessment & Plan Note (Signed)
Exacerbation of chronic headaches appears to be related to timing of 7 days off of birth control pills.  Discussed other options for birth control at this time the patient would like to think about options and continue Junel for now.  Refills provided.

## 2019-06-01 NOTE — Assessment & Plan Note (Signed)
Continue nightly amitriptyline and Junel birth control as these seem to help manage headaches.  Avoid fasting if possible, eating a healthy diet and exercising regularly.  Avoid making abrupt changes in dietary practices to avoid hypoglycemia.  Consider using Aimovig if headaches not controlled by current management strategies.  Instructed to reach out to office if difficulty obtaining Aimovig.

## 2019-09-25 ENCOUNTER — Emergency Department (HOSPITAL_COMMUNITY)
Admission: EM | Admit: 2019-09-25 | Discharge: 2019-09-25 | Disposition: A | Discharge status: Home / Self Care | Payer: 59 | Attending: Emergency Medicine | Admitting: Emergency Medicine

## 2019-09-25 ENCOUNTER — Other Ambulatory Visit: Payer: Self-pay

## 2019-09-25 DIAGNOSIS — S61112A Laceration without foreign body of left thumb with damage to nail, initial encounter: Secondary | ICD-10-CM | POA: Diagnosis not present

## 2019-09-25 DIAGNOSIS — Y998 Other external cause status: Secondary | ICD-10-CM | POA: Insufficient documentation

## 2019-09-25 DIAGNOSIS — W260XXA Contact with knife, initial encounter: Secondary | ICD-10-CM | POA: Insufficient documentation

## 2019-09-25 DIAGNOSIS — J45909 Unspecified asthma, uncomplicated: Secondary | ICD-10-CM | POA: Diagnosis not present

## 2019-09-25 DIAGNOSIS — Y9389 Activity, other specified: Secondary | ICD-10-CM | POA: Diagnosis not present

## 2019-09-25 DIAGNOSIS — Y929 Unspecified place or not applicable: Secondary | ICD-10-CM | POA: Insufficient documentation

## 2019-09-25 DIAGNOSIS — S6992XA Unspecified injury of left wrist, hand and finger(s), initial encounter: Secondary | ICD-10-CM | POA: Diagnosis present

## 2019-09-25 DIAGNOSIS — Z79899 Other long term (current) drug therapy: Secondary | ICD-10-CM | POA: Insufficient documentation

## 2019-09-25 MED ORDER — TETANUS-DIPHTH-ACELL PERTUSSIS 5-2.5-18.5 LF-MCG/0.5 IM SUSP
0.5000 mL | Freq: Once | INTRAMUSCULAR | Status: AC
  Administered 2019-09-25: 0.5 mL via INTRAMUSCULAR
  Filled 2019-09-25: qty 0.5

## 2019-09-25 NOTE — Discharge Instructions (Signed)
As discussed, your laceration was too shallow for suture repair.  Continue to keep bandage and splint on for the next 24 hours.  Expect intermittent bleeding.  You may take over-the-counter ibuprofen or Tylenol as needed for pain.  Please follow-up with PCP if symptoms do not improve within the next week.

## 2019-09-25 NOTE — ED Provider Notes (Signed)
[facility] EMERGENCY DEPARTMENT Provider Note   CSN: [id number] Arrival date & time: 09/25/19  1522     History Chief Complaint  Patient presents with  . Laceration    Kelsey [name] is a 39 y.o. female with a past medical history significant for asthma, depression, anxiety, migraines, IBS, and GERD who presents to the ED due to a laceration to her left thumb.  Patient states she cut her left thumb with a knife 30 minutes prior to arrival.  After the injury, patient applied pressure which stopped the bleeding.  No other treatment prior to arrival.  Denies numbness and tingling of left thumb.  Left thumb laceration associated with moderate pain worse with movement.  No other injuries.  Patient is not currently on any blood thinners.  History obtained from patient and past medical records. No interpreter used during encounter.      Past Medical History:  Diagnosis Date  . Anxiety and depression   . Asthma   . Depression   . Endometriosis   . Frequent headaches   . GERD (gastroesophageal reflux disease)   . Hx of varicella   . IBS (irritable bowel syndrome)   . Migraines   . Panic attacks   . Postpartum care following cesarean delivery (2/18) 07/08/2015  . Vertigo 12/31/2013    Patient Active Problem List   Diagnosis Date Noted  . Encounter for female birth control 06/01/2019  . 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 Arthralgia 06/09/2017  . Abnormal laboratory test 09/07/2014  . Asthma, mild persistent 03/08/2014  . GERD (gastroesophageal reflux disease) 03/08/2014  . Seasonal allergies 03/08/2014  . IBS (irritable bowel syndrome) 03/08/2014  . Anxiety and depression 03/08/2014  . Chronic daily headache 12/31/2013  . Vertigo 12/31/2013  . Postconcussive syndrome 12/31/2013    Past Surgical History:  Procedure Laterality Date  . CESAREAN SECTION N/A 07/08/2015   Procedure: CESAREAN SECTION;  Surgeon: Aloha Gell, MD;  Location: [facility];  Service:  Obstetrics;  Laterality: N/A;  . NO PAST SURGERIES       OB History    Gravida  1   Para  1   Term  1   Preterm      AB      Living  1     SAB      TAB      Ectopic      Multiple  0   Live Births  1           Family History  Problem Relation Age of Onset  . Hypertension Mother   . Cancer Mother        multiple myeloma  . Hypertension Father   . Stroke Father   . Cancer Maternal Grandmother        CLL    Social History   Tobacco Use  . Smoking status: Never Smoker  . Smokeless tobacco: Never Used  Substance Use Topics  . Alcohol use: No    Alcohol/week: 0.0 standard drinks  . Drug use: No    Home Medications Prior to Admission medications   Medication Sig Start Date End Date Taking? Authorizing Provider  albuterol (PROVENTIL HFA;VENTOLIN HFA) 108 (90 Base) MCG/ACT inhaler INHALE 2 PUFFS INTO THE LUNGS EVERY 4 (FOUR) HOURS AS NEEDED FOR WHEEZING. 07/02/17   Gregor Hams, MD  amitriptyline (ELAVIL) 25 MG tablet TAKE 1/2 TABLET BY MOUTH EVERY DAY AT BEDTIME 12/30/18   Gregor Hams, MD  BIOTIN PO Take by  mouth.    [provider]  diclofenac sodium (VOLTAREN) 1 % GEL Apply 4 g topically 4 (four) times daily. To affected joint. 06/09/17   Gregor Hams, MD  Erenumab-aooe (AIMOVIG) 70 MG/ML SOAJ Inject 70 mg into the skin every 30 (thirty) days. Patient not taking: Reported on 06/01/2019 01/14/19   Gregor Hams, MD  fluticasone [facility]) 50 MCG/ACT nasal spray SPRAY 2 SPRAYS INTO EACH NOSTRIL EVERY DAY 12/30/18   Gregor Hams, MD  JUNEL 1/20 1-20 MG-MCG tablet TAKE 1 (ONE) TABLET DAILY CONTINUOUS ACTIVE PILLS 06/01/19   Samuel Bouche, NP  meclizine (ANTIVERT) 25 MG tablet Take 1 tablet (25 mg total) by mouth 3 (three) times daily as needed for dizziness. 04/08/18   Noe Gens, PA-C  ondansetron (ZOFRAN-ODT) 4 MG disintegrating tablet Take 1 tablet (4 mg total) by mouth every 8 (eight) hours as needed for nausea or vomiting. 07/22/18   Jacqulyn Cane,  MD  pantoprazole (PROTONIX) 40 MG tablet Take 1 tablet (40 mg total) by mouth daily. 12/30/18   Gregor Hams, MD  Prenatal Vit-Fe Fumarate-FA (PRENATAL MULTIVITAMIN) TABS tablet Take 1 tablet by mouth daily at 12 noon.    [provider]    Allergies    Catfish [fish allergy], Fish-derived products, Pineapple, Citric acid, Monosodium glutamate, Tomato flavor  [flavoring agent], and Phenergan [promethazine hcl]  Review of Systems   Review of Systems  Constitutional: Negative for chills and fever.  Musculoskeletal: Positive for arthralgias.  Skin: Positive for color change and wound.  Neurological: Negative for numbness.    Physical Exam Updated Vital Signs BP 122/90 (BP Location: Left Arm)   Pulse 88   Temp 98.2 F (36.8 C) (Oral)   Resp 14   Ht '5\' 5"'  (1.651 m)   Wt 63.5 kg   SpO2 100%   BMI 23.30 kg/m   Physical Exam Vitals and nursing note reviewed.  Constitutional:      General: She is not in acute distress.    Appearance: She is not ill-appearing.  HENT:     Head: Normocephalic.  Eyes:     Conjunctiva/sclera: Conjunctivae normal.  Cardiovascular:     Rate and Rhythm: Normal rate and regular rhythm.     Pulses: Normal pulses.     Heart sounds: Normal heart sounds. No murmur. No friction rub. No gallop.   Pulmonary:     Effort: Pulmonary effort is normal.     Breath sounds: Normal breath sounds.  Abdominal:     General: Abdomen is flat. Bowel sounds are normal. There is no distension.     Palpations: Abdomen is soft.     Tenderness: There is no abdominal tenderness. There is no guarding or rebound.  Musculoskeletal:     Cervical back: Neck supple.     Comments: Full range of motion of left thumb and all left fingers.  Radial pulse intact.  Capillary refill of left thumb <2.   Skin:    Capillary Refill: Capillary refill takes less than 2 seconds.     Comments: 1 cm laceration through tip of fingernail of left thumb.  Hemostasis achieved.  Neurological:       General: No focal deficit present.     Mental Status: She is alert.  Psychiatric:        Mood and Affect: Mood normal.        Behavior: Behavior normal.         ED Results / Procedures / Treatments  Labs (all labs ordered are listed, but only abnormal results are displayed) Labs Reviewed - No data to display  EKG None  Radiology No results found.  Procedures Procedures (including critical care time)  Medications Ordered in ED Medications - No data to display  ED Course  I have reviewed the triage vital signs and the nursing notes.  Pertinent labs & imaging results that were available during my care of the patient were reviewed by me and considered in my medical decision making (see chart for details).    MDM Rules/Calculators/A&P                     39 year old female presents to the ED due to laceration to left thumb after cutting it with a knife just prior to arrival.  Patient is not currently on any blood thinners.  Hemostasis achieved.  Vitals all within normal limits.  Patient in no acute distress and non-ill-appearing.  1 cm laceration through tip of fingernail on left thumb to palmar aspect. See photos above. No active bleeding.  Full range of motion of left thumb.  Good capillary refill.  Radial pulse intact.  Left thumb thoroughly cleaned here in the ED. Left thumb placed in bandage and splint.  Advised patient to take over-the-counter ibuprofen or Tylenol as needed for pain.  Laceration too shallow to warrant suture repair.  Discussed case with Dr. Sabra Heck who agrees with assessment and plan. Strict ED precautions discussed with patient. Patient states understanding and agrees to plan. Patient discharged home in no acute distress and stable vitals.  Final Clinical Impression(s) / ED Diagnoses Final diagnoses:  Laceration of left thumb without foreign body with damage to nail, initial encounter    Rx / DC Orders ED Discharge Orders    None        Karie Kirks 09/25/19 1706    Noemi Chapel, MD 09/26/19 1451

## 2019-09-25 NOTE — Progress Notes (Signed)
Orthopedic Tech Progress Note Patient Details:  Kelsey [name] 11-Sep-1980 [id number]  Ortho Devices Type of Ortho Device: Finger splint Ortho Device/Splint Interventions: Application   Post Interventions Patient Tolerated: Well   [name] 09/25/2019, 5:23 PM

## 2019-09-25 NOTE — ED Triage Notes (Signed)
Onset 30 minutes ago pt cut top of left index finger with knife, oozing minimal blood.

## 2019-10-06 ENCOUNTER — Encounter: Payer: Self-pay | Admitting: Family Medicine

## 2020-01-03 ENCOUNTER — Other Ambulatory Visit: Payer: Self-pay | Admitting: Family Medicine

## 2020-01-06 ENCOUNTER — Other Ambulatory Visit: Payer: Self-pay

## 2020-01-06 MED ORDER — AMITRIPTYLINE HCL 25 MG PO TABS: 12.5000 mg | ORAL_TABLET | Freq: Every day | ORAL | 0 refills | Status: DC

## 2020-01-21 ENCOUNTER — Other Ambulatory Visit: Payer: Self-pay | Admitting: Family Medicine

## 2020-02-04 ENCOUNTER — Other Ambulatory Visit: Payer: Self-pay | Admitting: Medical-Surgical

## 2020-02-08 ENCOUNTER — Encounter: Payer: Self-pay | Admitting: Medical-Surgical

## 2020-02-08 ENCOUNTER — Other Ambulatory Visit: Payer: Self-pay

## 2020-02-08 ENCOUNTER — Ambulatory Visit (INDEPENDENT_AMBULATORY_CARE_PROVIDER_SITE_OTHER): Payer: 59 | Admitting: Medical-Surgical

## 2020-02-08 ENCOUNTER — Other Ambulatory Visit: Payer: Self-pay | Admitting: Medical-Surgical

## 2020-02-08 VITALS — BP 123/82 | HR 97 | Temp 98.1°F | Ht 65.5 in | Wt 140.3 lb

## 2020-02-08 DIAGNOSIS — Z1329 Encounter for screening for other suspected endocrine disorder: Secondary | ICD-10-CM

## 2020-02-08 DIAGNOSIS — R42 Dizziness and giddiness: Secondary | ICD-10-CM

## 2020-02-08 DIAGNOSIS — Z0000 Encounter for general adult medical examination without abnormal findings: Secondary | ICD-10-CM

## 2020-02-08 DIAGNOSIS — Z23 Encounter for immunization: Secondary | ICD-10-CM | POA: Diagnosis not present

## 2020-02-08 DIAGNOSIS — K580 Irritable bowel syndrome with diarrhea: Secondary | ICD-10-CM

## 2020-02-08 DIAGNOSIS — K219 Gastro-esophageal reflux disease without esophagitis: Secondary | ICD-10-CM

## 2020-02-08 DIAGNOSIS — R519 Headache, unspecified: Secondary | ICD-10-CM

## 2020-02-08 MED ORDER — AMITRIPTYLINE HCL 25 MG PO TABS: 25.0000 mg | ORAL_TABLET | Freq: Every day | ORAL | 0 refills | Status: DC

## 2020-02-08 MED ORDER — MECLIZINE HCL 25 MG PO TABS: 25.0000 mg | ORAL_TABLET | Freq: Three times a day (TID) | ORAL | 0 refills | Status: DC | PRN

## 2020-02-08 MED ORDER — ONDANSETRON 4 MG PO TBDP: 4.0000 mg | ORAL_TABLET | Freq: Three times a day (TID) | ORAL | 0 refills | Status: DC | PRN

## 2020-02-08 MED ORDER — PANTOPRAZOLE SODIUM 40 MG PO TBEC: 40.0000 mg | DELAYED_RELEASE_TABLET | Freq: Every day | ORAL | 3 refills | Status: DC

## 2020-02-08 NOTE — Patient Instructions (Addendum)
Preventive Care 21-37 Years Old, Female Preventive care refers to visits with your health care provider and lifestyle choices that can promote health and wellness. This includes:  A yearly physical exam. This may also be called an annual well check.  Regular dental visits and eye exams.  Immunizations.  Screening for certain conditions.  Healthy lifestyle choices, such as eating a healthy diet, getting regular exercise, not using drugs or products that contain nicotine and tobacco, and limiting alcohol use. What can I expect for my preventive care visit? Physical exam Your health care provider will check your:  Height and weight. This may be used to calculate body mass index (BMI), which tells if you are at a healthy weight.  Heart rate and blood pressure.  Skin for abnormal spots. Counseling Your health care provider may ask you questions about your:  Alcohol, tobacco, and drug use.  Emotional well-being.  Home and relationship well-being.  Sexual activity.  Eating habits.  Work and work environment.  Method of birth control.  Menstrual cycle.  Pregnancy history. What immunizations do I need?  Influenza (flu) vaccine  This is recommended every year. Tetanus, diphtheria, and pertussis (Tdap) vaccine  You may need a Td booster every 10 years. Varicella (chickenpox) vaccine  You may need this if you have not been vaccinated. Human papillomavirus (HPV) vaccine  If recommended by your health care provider, you may need three doses over 6 months. Measles, mumps, and rubella (MMR) vaccine  You may need at least one dose of MMR. You may also need a second dose. Meningococcal conjugate (MenACWY) vaccine  One dose is recommended if you are age 19-21 years and a first-year college student living in a residence hall, or if you have one of several medical conditions. You may also need additional booster doses. Pneumococcal conjugate (PCV13) vaccine  You may need  this if you have certain conditions and were not previously vaccinated. Pneumococcal polysaccharide (PPSV23) vaccine  You may need one or two doses if you smoke cigarettes or if you have certain conditions. Hepatitis A vaccine  You may need this if you have certain conditions or if you travel or work in places where you may be exposed to hepatitis A. Hepatitis B vaccine  You may need this if you have certain conditions or if you travel or work in places where you may be exposed to hepatitis B. Haemophilus influenzae type b (Hib) vaccine  You may need this if you have certain conditions. You may receive vaccines as individual doses or as more than one vaccine together in one shot (combination vaccines). Talk with your health care provider about the risks and benefits of combination vaccines. What tests do I need?  Blood tests  Lipid and cholesterol levels. These may be checked every 5 years starting at age 20.  Hepatitis C test.  Hepatitis B test. Screening  Diabetes screening. This is done by checking your blood sugar (glucose) after you have not eaten for a while (fasting).  Sexually transmitted disease (STD) testing.  BRCA-related cancer screening. This may be done if you have a family history of breast, ovarian, tubal, or peritoneal cancers.  Pelvic exam and Pap test. This may be done every 3 years starting at age 21. Starting at age 30, this may be done every 5 years if you have a Pap test in combination with an HPV test. Talk with your health care provider about your test results, treatment options, and if necessary, the need for more tests.   Follow these instructions at home: Eating and drinking   Eat a diet that includes fresh fruits and vegetables, whole grains, lean protein, and low-fat dairy.  Take vitamin and mineral supplements as recommended by your health care provider.  Do not drink alcohol if: ? Your health care provider tells you not to drink. ? You are  pregnant, may be pregnant, or are planning to become pregnant.  If you drink alcohol: ? Limit how much you have to 0-1 drink a day. ? Be aware of how much alcohol is in your drink. In the [place] one drink equals one 12 oz bottle of beer (355 mL), one 5 oz glass of wine (148 mL), or one 1 oz glass of hard liquor (44 mL). Lifestyle  Take daily care of your teeth and gums.  Stay active. Exercise for at least 30 minutes on 5 or more days each week.  Do not use any products that contain nicotine or tobacco, such as cigarettes, e-cigarettes, and chewing tobacco. If you need help quitting, ask your health care provider.  If you are sexually active, practice safe sex. Use a condom or other form of birth control (contraception) in order to prevent pregnancy and STIs (sexually transmitted infections). If you plan to become pregnant, see your health care provider for a preconception visit. What's next?  Visit your health care provider once a year for a well check visit.  Ask your health care provider how often you should have your eyes and teeth checked.  Stay up to date on all vaccines. This information is not intended to replace advice given to you by your health care provider. Make sure you discuss any questions you have with your health care provider. Document Revised: 01/15/2018 Document Reviewed: 01/15/2018 Elsevier Patient Education  [place]  FODMAPs (fermentable oligosaccharides, disaccharides, monosaccharides, and polyols) are sugars that are hard for some people to digest. A low-FODMAP eating plan may help some people who have bowel (intestinal) diseases to manage their symptoms. This meal plan can be complicated to follow. Work with a diet and nutrition specialist (dietitian) to make a low-FODMAP eating plan that is right for you. A dietitian can make sure that you get enough nutrition from this diet. What are tips for following this plan? Reading  food labels  Check labels for hidden FODMAPs such as: ? High-fructose syrup. ? Honey. ? Agave. ? Natural fruit flavors. ? Onion or garlic powder.  Choose low-FODMAP foods that contain 3-4 grams of fiber per serving.  Check food labels for serving sizes. Eat only one serving at a time to make sure FODMAP levels stay low. Meal planning  Follow a low-FODMAP eating plan for up to 6 weeks, or as told by your health care provider or dietitian.  To follow the eating plan: 1. Eliminate high-FODMAP foods from your diet completely. 2. Gradually reintroduce high-FODMAP foods into your diet one at a time. Most people should wait a few days after introducing one high-FODMAP food before they introduce the next high-FODMAP food. Your dietitian can recommend how quickly you may reintroduce foods. 3. Keep a daily record of what you eat and drink, and make note of any symptoms that you have after eating. 4. Review your daily record with a dietitian regularly. Your dietitian can help you identify which foods you can eat and which foods you should avoid. General tips  Drink enough fluid each day to keep your urine pale yellow.  Avoid processed foods.  These often have added sugar and may be high in FODMAPs.  Avoid most dairy products, whole grains, and sweeteners.  Work with a dietitian to make sure you get enough fiber in your diet. Recommended foods Grains  Gluten-free grains, such as rice, oats, buckwheat, quinoa, corn, polenta, and millet. Gluten-free pasta, bread, or cereal. Rice noodles. Corn tortillas. Vegetables  Eggplant, zucchini, cucumber, peppers, green beans, Brussels sprouts, bean sprouts, lettuce, arugula, kale, Swiss chard, spinach, collard greens, bok choy, summer squash, potato, and tomato. Limited amounts of corn, carrot, and sweet potato. Green parts of scallions. Fruits  Bananas, oranges, lemons, limes, blueberries, raspberries, strawberries, grapes, cantaloupe, honeydew melon,  kiwi, papaya, passion fruit, and pineapple. Limited amounts of dried cranberries, banana chips, and shredded coconut. Dairy  Lactose-free milk, yogurt, and kefir. Lactose-free cottage cheese and ice cream. Non-dairy milks, such as almond, coconut, hemp, and rice milk. Yogurts made of non-dairy milks. Limited amounts of goat cheese, brie, mozzarella, parmesan, swiss, and other hard cheeses. Meats and other protein foods  Unseasoned beef, pork, poultry, or fish. Eggs. Berniece Salines. Tofu (firm) and tempeh. Limited amounts of nuts and seeds, such as almonds, walnuts, [place] nuts, pecans, peanuts, pumpkin seeds, chia seeds, and sunflower seeds. Fats and oils  Butter-free spreads. Vegetable oils, such as olive, canola, and sunflower oil. Seasoning and other foods  Artificial sweeteners with names that do not end in "ol" such as aspartame, saccharine, and stevia. Maple syrup, white table sugar, raw sugar, brown sugar, and molasses. Fresh basil, coriander, parsley, rosemary, and thyme. Beverages  Water and mineral water. Sugar-sweetened soft drinks. Small amounts of orange juice or cranberry juice. Black and green tea. Most dry wines. Coffee. This may not be a complete list of low-FODMAP foods. Talk with your dietitian for more information. Foods to avoid Grains  Wheat, including kamut, durum, and semolina. Barley and bulgur. Couscous. Wheat-based cereals. Wheat noodles, bread, crackers, and pastries. Vegetables  Chicory root, artichoke, asparagus, cabbage, snow peas, sugar snap peas, mushrooms, and cauliflower. Onions, garlic, leeks, and the white part of scallions. Fruits  Fresh, dried, and juiced forms of apple, pear, watermelon, peach, plum, cherries, apricots, blackberries, boysenberries, figs, nectarines, and mango. Avocado. Dairy  Milk, yogurt, ice cream, and soft cheese. Cream and sour cream. Milk-based sauces. Custard. Meats and other protein foods  Fried or fatty meat. Sausage. Cashews and  pistachios. Soybeans, baked beans, black beans, chickpeas, kidney beans, fava beans, navy beans, lentils, and split peas. Seasoning and other foods  Any sugar-free gum or candy. Foods that contain artificial sweeteners such as sorbitol, mannitol, isomalt, or xylitol. Foods that contain honey, high-fructose corn syrup, or agave. Bouillon, vegetable stock, beef stock, and chicken stock. Garlic and onion powder. Condiments made with onion, such as hummus, chutney, pickles, relish, salad dressing, and salsa. Tomato paste. Beverages  Chicory-based drinks. Coffee substitutes. Chamomile tea. Fennel tea. Sweet or fortified wines such as port or sherry. Diet soft drinks made with isomalt, mannitol, maltitol, sorbitol, or xylitol. Apple, pear, and mango juice. Juices with high-fructose corn syrup. This may not be a complete list of high-FODMAP foods. Talk with your dietitian to discuss what dietary choices are best for you.  Summary  A low-FODMAP eating plan is a short-term diet that eliminates FODMAPs from your diet to help ease symptoms of certain bowel diseases.  The eating plan usually lasts up to 6 weeks. After that, high-FODMAP foods are restarted gradually, one at a time, so you can find out which may be causing symptoms.  A low-FODMAP eating plan can be complicated. It is best to work with a dietitian who has experience with this type of plan. This information is not intended to replace advice given to you by your health care provider. Make sure you discuss any questions you have with your health care provider. Document Revised: 04/18/2017 Document Reviewed: 12/31/2016 Elsevier Patient Education  [place].  Influenza (Flu) Vaccine (Inactivated or Recombinant): What You Need to Know 1. Why get vaccinated? Influenza vaccine can prevent influenza (flu). Flu is a contagious disease that spreads around the [place] every year, usually between October and May. Anyone can get the flu, but  it is more dangerous for some people. Infants and young children, people 55 years of age and older, pregnant women, and people with certain health conditions or a weakened immune system are at greatest risk of flu complications. Pneumonia, bronchitis, sinus infections and ear infections are examples of flu-related complications. If you have a medical condition, such as heart disease, cancer or diabetes, flu can make it worse. Flu can cause fever and chills, sore throat, muscle aches, fatigue, cough, headache, and runny or stuffy nose. Some people may have vomiting and diarrhea, though this is more common in children than adults. Each year thousands of people in the [place] die from flu, and many more are hospitalized. Flu vaccine prevents millions of illnesses and flu-related visits to the doctor each year. 2. Influenza vaccine CDC recommends everyone 26 months of age and older get vaccinated every flu season. Children 6 months through 37 years of age may need 2 doses during a single flu season. Everyone else needs only 1 dose each flu season. It takes about 2 weeks for protection to develop after vaccination. There are many flu viruses, and they are always changing. Each year a new flu vaccine is made to protect against three or four viruses that are likely to cause disease in the upcoming flu season. Even when the vaccine doesn't exactly match these viruses, it may still provide some protection. Influenza vaccine does not cause flu. Influenza vaccine may be given at the same time as other vaccines. 3. Talk with your health care provider Tell your vaccine provider if the person getting the vaccine:  Has had an allergic reaction after a previous dose of influenza vaccine, or has any severe, life-threatening allergies.  Has ever had Guillain-Barr Syndrome (also called GBS). In some cases, your health care provider may decide to postpone influenza vaccination to a future visit. People with minor  illnesses, such as a cold, may be vaccinated. People who are moderately or severely ill should usually wait until they recover before getting influenza vaccine. Your health care provider can give you more information. 4. Risks of a vaccine reaction  Soreness, redness, and swelling where shot is given, fever, muscle aches, and headache can happen after influenza vaccine.  There may be a very small increased risk of Guillain-Barr Syndrome (GBS) after inactivated influenza vaccine (the flu shot). Young children who get the flu shot along with pneumococcal vaccine (PCV13), and/or DTaP vaccine at the same time might be slightly more likely to have a seizure caused by fever. Tell your health care provider if a child who is getting flu vaccine has ever had a seizure. People sometimes faint after medical procedures, including vaccination. Tell your provider if you feel dizzy or have vision changes or ringing in the ears. As with any medicine, there is a very remote chance of a vaccine causing  a severe allergic reaction, other serious injury, or death. 5. What if there is a serious problem? An allergic reaction could occur after the vaccinated person leaves the clinic. If you see signs of a severe allergic reaction (hives, swelling of the face and throat, difficulty breathing, a fast heartbeat, dizziness, or weakness), call 9-1-1 and get the person to the nearest hospital. For other signs that concern you, call your health care provider. Adverse reactions should be reported to the Vaccine Adverse Event Reporting System (VAERS). Your health care provider will usually file this report, or you can do it yourself. Visit the VAERS website at www.vaers.SamedayNews.es or call (937) 831-4181.VAERS is only for reporting reactions, and VAERS staff do not give medical advice. 6. The National Vaccine Injury Compensation Program The [employer] Vaccine Injury Compensation Program (VICP) is a federal program that was created to  compensate people who may have been injured by certain vaccines. Visit the VICP website at GoldCloset.com.ee or call 5636637332 to learn about the program and about filing a claim. There is a time limit to file a claim for compensation. 7. How can I learn more?  Ask your healthcare provider.  Call your local or state health department.  Contact the Centers for Disease Control and Prevention (CDC): ? Call 309-425-1952 (1-800-CDC-INFO) or ? Visit CDC's https://gibson.com/ Vaccine Information Statement (Interim) Inactivated Influenza Vaccine (01/01/2018) This information is not intended to replace advice given to you by your health care provider. Make sure you discuss any questions you have with your health care provider. Document Revised: 08/25/2018 Document Reviewed: 01/05/2018 Elsevier Patient Education  [place].

## 2020-02-08 NOTE — Progress Notes (Signed)
HPI: Kelsey [name] is a 39 y.o. female who  has a past medical history of Anxiety and depression, Asthma, Depression, Endometriosis, Frequent headaches, GERD (gastroesophageal reflux disease), varicella, IBS (irritable bowel syndrome), Migraines, Panic attacks, Postpartum care following cesarean delivery (2/18) (07/08/2015), and Vertigo (12/31/2013).  she presents to [facility] today, 02/08/20,  for chief complaint of:  Annual physical exam  Dentist- every 6 months, over due at the moment Eye exam- last done in November 2020, wears glasses Exercise- none intentional Diet- tries to make healthy choices, limited meats, low fats, high fiber, fish once a week, lots of salads  Concerns:   IBS- flaring up recently. Worse lately, especially when on the go. Nausea but no vomiting.   Vestibular issues- while driving, had to pull over due to dizziness, nausea. Has intermittent episodes but not as severe. Happened the other night and the room started spinning, usually rolls over and it helps but this time it didn't; had vestibular rehab years ago. Friend who is a PT said she thought was sensory integration.  Certain sounds getting to her, especially water noises. Gets anxious and tense when she hears something like that.  Headaches- daily headache for the last week. Starts at the left side of her neck and radiates up the side of her head. Resolved today. Took Tylenol once and it helped. Gets headaches when on the placebo pills of her birth control. Started on Elavil for prophylaxis but only takes 12.2m nightly. Never started Aimovig that was prescribed last year.  Past medical, surgical, social and family history reviewed:  Patient Active Problem List   Diagnosis Date Noted  . Encounter for female birth control 06/01/2019  . 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 Arthralgia 06/09/2017  . Abnormal laboratory test 09/07/2014  . Asthma, mild persistent 03/08/2014  .  GERD (gastroesophageal reflux disease) 03/08/2014  . Seasonal allergies 03/08/2014  . IBS (irritable bowel syndrome) 03/08/2014  . Anxiety and depression 03/08/2014  . Chronic daily headache 12/31/2013  . Vertigo 12/31/2013  . Postconcussive syndrome 12/31/2013    Past Surgical History:  Procedure Laterality Date  . CESAREAN SECTION N/A 07/08/2015   Procedure: CESAREAN SECTION;  Surgeon: KAloha Gell MD;  [facility];  Service: Obstetrics;  Laterality: N/A;  . NO PAST SURGERIES      Social History   Tobacco Use  . Smoking status: Never Smoker  . Smokeless tobacco: Never Used  Substance Use Topics  . Alcohol use: No    Alcohol/week: 0.0 standard drinks    Family History  Problem Relation Age of Onset  . Hypertension Mother   . Cancer Mother        multiple myeloma  . Hypertension Father   . Stroke Father   . Cancer Maternal Grandmother        CLL  . Post-traumatic stress disorder Maternal Uncle      Current medication list and allergy/intolerance information reviewed:    Current Outpatient Medications  Medication Sig Dispense Refill  . albuterol (PROVENTIL HFA;VENTOLIN HFA) 108 (90 Base) MCG/ACT inhaler INHALE 2 PUFFS INTO THE LUNGS EVERY 4 (FOUR) HOURS AS NEEDED FOR WHEEZING. 6.7 Inhaler 1  . amitriptyline (ELAVIL) 25 MG tablet Take 1 tablet (25 mg total) by mouth at bedtime. 15 tablet 0  . BIOTIN PO Take by mouth.    . diclofenac sodium (VOLTAREN) 1 % GEL Apply 4 g topically 4 (four) times daily. To affected joint. 100 g 11  . fluticasone (FLONASE) 50 MCG/ACT  nasal spray SPRAY 2 SPRAYS INTO EACH NOSTRIL EVERY DAY 16 mL 12  . JUNEL 1/20 1-20 MG-MCG tablet TAKE 1 (ONE) TABLET DAILY CONTINUOUS ACTIVE PILLS 4 Package 3  . meclizine (ANTIVERT) 25 MG tablet Take 1 tablet (25 mg total) by mouth 3 (three) times daily as needed for dizziness. 30 tablet 0  . ondansetron (ZOFRAN-ODT) 4 MG disintegrating tablet Take 1 tablet (4 mg total) by mouth every 8 (eight) hours as  needed for nausea or vomiting. 30 tablet 0  . pantoprazole (PROTONIX) 40 MG tablet Take 1 tablet (40 mg total) by mouth daily. 90 tablet 3  . Prenatal Vit-Fe Fumarate-FA (PRENATAL MULTIVITAMIN) TABS tablet Take 1 tablet by mouth daily at 12 noon.     No current facility-administered medications for this visit.    Allergies  Allergen Reactions  . Catfish [Fish Allergy] Shortness Of Breath  . Fish-Derived Products Shortness Of Breath  . Pineapple Hives  . Citric Acid     Per patient report  . Monosodium Glutamate     Headache  . Tomato Flavor  [Flavoring Agent]     Mouth sores  . Phenergan [Promethazine Hcl] Other (See Comments)    Restless motion      Review of Systems:  Constitutional:  No  fever, no chills, No recent illness, No unintentional weight changes. No significant fatigue.   HEENT: +  headache, no vision change, no hearing change, No sore throat, No  sinus pressure  Cardiac: No  chest pain, No  pressure, No palpitations, No  Orthopnea  Respiratory:  No  shortness of breath. No  Cough  Gastrointestinal: + abdominal pain, +  nausea, No  vomiting,  No  blood in stool, + diarrhea, No constipation   Musculoskeletal: No new myalgia/arthralgia  Skin: No  Rash, No other wounds/concerning lesions  Genitourinary: No  incontinence, No  abnormal genital bleeding, No abnormal genital discharge  Hem/Onc: No  easy bruising/bleeding, No  abnormal lymph node  Endocrine: No cold intolerance,  No heat intolerance. No polyuria/polydipsia/polyphagia   Neurologic: No  weakness, + dizziness, No  slurred speech/focal weakness/facial droop  Psychiatric: No concerns with depression, + concerns with anxiety, No sleep problems, No mood problems  Exam:  BP 123/82   Pulse 97   Temp 98.1 F (36.7 C) (Oral)   Ht 5' 5.5" (1.664 m)   Wt 140 lb 4.8 oz (63.6 kg)   SpO2 100%   BMI 22.99 kg/m   Constitutional: VS see above. General Appearance: alert, well-developed, well-nourished,  NAD  Eyes: Normal lids and conjunctive, non-icteric sclera  Ears, Nose, Mouth, Throat: MMM, Normal external inspection ears/nares/mouth/lips/gums. TM normal bilaterally. Pharynx/tonsils no erythema, no exudate. Nasal mucosa normal.   Neck: No masses, trachea midline. No thyroid enlargement. No tenderness/mass appreciated. No lymphadenopathy  Respiratory: Normal respiratory effort. no wheeze, no rhonchi, no rales  Cardiovascular: S1/S2 normal, no murmur, no rub/gallop auscultated. RRR. No lower extremity edema. Pedal pulse II/IV bilaterally DP and PT. No carotid bruit or JVD. No abdominal aortic bruit.  Gastrointestinal: Nontender, no masses. No hepatomegaly, no splenomegaly. No hernia appreciated. Bowel sounds normal. Rectal exam deferred.   Musculoskeletal: Gait normal. No clubbing/cyanosis of digits.   Neurological: Normal balance/coordination. No tremor. No cranial nerve deficit on limited exam. Motor and sensation intact and symmetric. Cerebellar reflexes intact.   Skin: warm, dry, intact. No rash/ulcer. No concerning nevi or subq nodules on limited exam.    Psychiatric: Normal judgment/insight. Normal mood and affect. Oriented x3.  No results found for this or any previous visit (from the past 72 hour(s)).  No results found.   ASSESSMENT/PLAN:   1. Annual physical exam Checking CBC, CMP, and Lipid panel. Has a work form to be completed. Once lab results are back, we will get this taken care of.   2. Need for influenza vaccination Flu vaccine given in office by MA.  - Flu Vaccine QUAD 36+ mos IM  3. IBS  Referring to GI at patient's request.   4. Screening for endocrine disorder Checking TSH.  5. Vertigo Referring for formal vestibular rehab. Continue Meclizine as needed.   6. GERD Ran out of Protonix and switched to Omeprazole. Notes her symptoms have gotten worse and would like to restart Protonix. Refills provided.   7. Chronic daily headache Increase Elavil  to 83m nightly. If this is not effective, may need to consider abortive agents, other prophylaxis agents, or alternative birth control measures.   Orders Placed This Encounter  Procedures  . Flu Vaccine QUAD 36+ mos IM  . CBC  . COMPLETE METABOLIC PANEL WITH GFR  . Lipid panel  . TSH  . Ambulatory referral to Gastroenterology  . Ambulatory referral to Physical Therapy    Meds ordered this encounter  Medications  . meclizine (ANTIVERT) 25 MG tablet    Sig: Take 1 tablet (25 mg total) by mouth 3 (three) times daily as needed for dizziness.    Dispense:  30 tablet    Refill:  0    Order Specific Question:   Supervising Provider    Answer:   AEmeterio Reeve[[id number] . amitriptyline (ELAVIL) 25 MG tablet    Sig: Take 1 tablet (25 mg total) by mouth at bedtime.    Dispense:  15 tablet    Refill:  0    Order Specific Question:   Supervising Provider    Answer:   AEmeterio Reeve[[id number] . pantoprazole (PROTONIX) 40 MG tablet    Sig: Take 1 tablet (40 mg total) by mouth daily.    Dispense:  90 tablet    Refill:  3    Order Specific Question:   Supervising Provider    Answer:   AEmeterio Reeve[[id number] . ondansetron (ZOFRAN-ODT) 4 MG disintegrating tablet    Sig: Take 1 tablet (4 mg total) by mouth every 8 (eight) hours as needed for nausea or vomiting.    Dispense:  30 tablet    Refill:  0    Order Specific Question:   Supervising Provider    Answer:   AEmeterio Reeve[[[id number]]   Patient Instructions  Preventive Care [id number] Old, Female Preventive care refers to visits with your health care provider and lifestyle choices that can promote health and wellness. This includes:  A yearly physical exam. This may also be called an annual well check.  Regular dental visits and eye exams.  Immunizations.  Screening for certain conditions.  Healthy lifestyle choices, such as eating a healthy diet, getting regular exercise, not using drugs or products that  contain nicotine and tobacco, and limiting alcohol use. What can I expect for my preventive care visit? Physical exam Your health care provider will check your:  Height and weight. This may be used to calculate body mass index (BMI), which tells if you are at a healthy weight.  Heart rate and blood pressure.  Skin for abnormal spots. Counseling Your health care provider may ask you questions about your:  Alcohol, tobacco, and drug  use.  Emotional well-being.  Home and relationship well-being.  Sexual activity.  Eating habits.  Work and work Statistician.  Method of birth control.  Menstrual cycle.  Pregnancy history. What immunizations do I need?  Influenza (flu) vaccine  This is recommended every year. Tetanus, diphtheria, and pertussis (Tdap) vaccine  You may need a Td booster every 10 years. Varicella (chickenpox) vaccine  You may need this if you have not been vaccinated. Human papillomavirus (HPV) vaccine  If recommended by your health care provider, you may need three doses over 6 months. Measles, mumps, and rubella (MMR) vaccine  You may need at least one dose of MMR. You may also need a second dose. Meningococcal conjugate (MenACWY) vaccine  One dose is recommended if you are age 72-21 years and a first-year college student living in a residence hall, or if you have one of several medical conditions. You may also need additional booster doses. Pneumococcal conjugate (PCV13) vaccine  You may need this if you have certain conditions and were not previously vaccinated. Pneumococcal polysaccharide (PPSV23) vaccine  You may need one or two doses if you smoke cigarettes or if you have certain conditions. Hepatitis A vaccine  You may need this if you have certain conditions or if you travel or work in places where you may be exposed to hepatitis A. Hepatitis B vaccine  You may need this if you have certain conditions or if you travel or work in places  where you may be exposed to hepatitis B. Haemophilus influenzae type b (Hib) vaccine  You may need this if you have certain conditions. You may receive vaccines as individual doses or as more than one vaccine together in one shot (combination vaccines). Talk with your health care provider about the risks and benefits of combination vaccines. What tests do I need?  Blood tests  Lipid and cholesterol levels. These may be checked every 5 years starting at age 54.  Hepatitis C test.  Hepatitis B test. Screening  Diabetes screening. This is done by checking your blood sugar (glucose) after you have not eaten for a while (fasting).  Sexually transmitted disease (STD) testing.  BRCA-related cancer screening. This may be done if you have a family history of breast, ovarian, tubal, or peritoneal cancers.  Pelvic exam and Pap test. This may be done every 3 years starting at age 9. Starting at age 17, this may be done every 5 years if you have a Pap test in combination with an HPV test. Talk with your health care provider about your test results, treatment options, and if necessary, the need for more tests. Follow these instructions at home: Eating and drinking   Eat a diet that includes fresh fruits and vegetables, whole grains, lean protein, and low-fat dairy.  Take vitamin and mineral supplements as recommended by your health care provider.  Do not drink alcohol if: ? Your health care provider tells you not to drink. ? You are pregnant, may be pregnant, or are planning to become pregnant.  If you drink alcohol: ? Limit how much you have to 0-1 drink a day. ? Be aware of how much alcohol is in your drink. In the [place] one drink equals one 12 oz bottle of beer (355 mL), one 5 oz glass of wine (148 mL), or one 1 oz glass of hard liquor (44 mL). Lifestyle  Take daily care of your teeth and gums.  Stay active. Exercise for at least 30 minutes on 5 or more  days each week.  Do not use  any products that contain nicotine or tobacco, such as cigarettes, e-cigarettes, and chewing tobacco. If you need help quitting, ask your health care provider.  If you are sexually active, practice safe sex. Use a condom or other form of birth control (contraception) in order to prevent pregnancy and STIs (sexually transmitted infections). If you plan to become pregnant, see your health care provider for a preconception visit. What's next?  Visit your health care provider once a year for a well check visit.  Ask your health care provider how often you should have your eyes and teeth checked.  Stay up to date on all vaccines. This information is not intended to replace advice given to you by your health care provider. Make sure you discuss any questions you have with your health care provider. Document Revised: 01/15/2018 Document Reviewed: 01/15/2018 Elsevier Patient Education  [place]  FODMAPs (fermentable oligosaccharides, disaccharides, monosaccharides, and polyols) are sugars that are hard for some people to digest. A low-FODMAP eating plan may help some people who have bowel (intestinal) diseases to manage their symptoms. This meal plan can be complicated to follow. Work with a diet and nutrition specialist (dietitian) to make a low-FODMAP eating plan that is right for you. A dietitian can make sure that you get enough nutrition from this diet. What are tips for following this plan? Reading food labels  Check labels for hidden FODMAPs such as: ? High-fructose syrup. ? Honey. ? Agave. ? Natural fruit flavors. ? Onion or garlic powder.  Choose low-FODMAP foods that contain 3-4 grams of fiber per serving.  Check food labels for serving sizes. Eat only one serving at a time to make sure FODMAP levels stay low. Meal planning  Follow a low-FODMAP eating plan for up to 6 weeks, or as told by your health care provider or dietitian.  To follow the  eating plan: 1. Eliminate high-FODMAP foods from your diet completely. 2. Gradually reintroduce high-FODMAP foods into your diet one at a time. Most people should wait a few days after introducing one high-FODMAP food before they introduce the next high-FODMAP food. Your dietitian can recommend how quickly you may reintroduce foods. 3. Keep a daily record of what you eat and drink, and make note of any symptoms that you have after eating. 4. Review your daily record with a dietitian regularly. Your dietitian can help you identify which foods you can eat and which foods you should avoid. General tips  Drink enough fluid each day to keep your urine pale yellow.  Avoid processed foods. These often have added sugar and may be high in FODMAPs.  Avoid most dairy products, whole grains, and sweeteners.  Work with a dietitian to make sure you get enough fiber in your diet. Recommended foods Grains  Gluten-free grains, such as rice, oats, buckwheat, quinoa, corn, polenta, and millet. Gluten-free pasta, bread, or cereal. Rice noodles. Corn tortillas. Vegetables  Eggplant, zucchini, cucumber, peppers, green beans, Brussels sprouts, bean sprouts, lettuce, arugula, kale, Swiss chard, spinach, collard greens, bok choy, summer squash, potato, and tomato. Limited amounts of corn, carrot, and sweet potato. Green parts of scallions. Fruits  Bananas, oranges, lemons, limes, blueberries, raspberries, strawberries, grapes, cantaloupe, honeydew melon, kiwi, papaya, passion fruit, and pineapple. Limited amounts of dried cranberries, banana chips, and shredded coconut. Dairy  Lactose-free milk, yogurt, and kefir. Lactose-free cottage cheese and ice cream. Non-dairy milks, such as almond, coconut, hemp, and rice  milk. Yogurts made of non-dairy milks. Limited amounts of goat cheese, brie, mozzarella, parmesan, swiss, and other hard cheeses. Meats and other protein foods  Unseasoned beef, pork, poultry, or fish.  Eggs. Berniece Salines. Tofu (firm) and tempeh. Limited amounts of nuts and seeds, such as almonds, walnuts, [place] nuts, pecans, peanuts, pumpkin seeds, chia seeds, and sunflower seeds. Fats and oils  Butter-free spreads. Vegetable oils, such as olive, canola, and sunflower oil. Seasoning and other foods  Artificial sweeteners with names that do not end in "ol" such as aspartame, saccharine, and stevia. Maple syrup, white table sugar, raw sugar, brown sugar, and molasses. Fresh basil, coriander, parsley, rosemary, and thyme. Beverages  Water and mineral water. Sugar-sweetened soft drinks. Small amounts of orange juice or cranberry juice. Black and green tea. Most dry wines. Coffee. This may not be a complete list of low-FODMAP foods. Talk with your dietitian for more information. Foods to avoid Grains  Wheat, including kamut, durum, and semolina. Barley and bulgur. Couscous. Wheat-based cereals. Wheat noodles, bread, crackers, and pastries. Vegetables  Chicory root, artichoke, asparagus, cabbage, snow peas, sugar snap peas, mushrooms, and cauliflower. Onions, garlic, leeks, and the white part of scallions. Fruits  Fresh, dried, and juiced forms of apple, pear, watermelon, peach, plum, cherries, apricots, blackberries, boysenberries, figs, nectarines, and mango. Avocado. Dairy  Milk, yogurt, ice cream, and soft cheese. Cream and sour cream. Milk-based sauces. Custard. Meats and other protein foods  Fried or fatty meat. Sausage. Cashews and pistachios. Soybeans, baked beans, black beans, chickpeas, kidney beans, fava beans, navy beans, lentils, and split peas. Seasoning and other foods  Any sugar-free gum or candy. Foods that contain artificial sweeteners such as sorbitol, mannitol, isomalt, or xylitol. Foods that contain honey, high-fructose corn syrup, or agave. Bouillon, vegetable stock, beef stock, and chicken stock. Garlic and onion powder. Condiments made with onion, such as hummus, chutney,  pickles, relish, salad dressing, and salsa. Tomato paste. Beverages  Chicory-based drinks. Coffee substitutes. Chamomile tea. Fennel tea. Sweet or fortified wines such as port or sherry. Diet soft drinks made with isomalt, mannitol, maltitol, sorbitol, or xylitol. Apple, pear, and mango juice. Juices with high-fructose corn syrup. This may not be a complete list of high-FODMAP foods. Talk with your dietitian to discuss what dietary choices are best for you.  Summary  A low-FODMAP eating plan is a short-term diet that eliminates FODMAPs from your diet to help ease symptoms of certain bowel diseases.  The eating plan usually lasts up to 6 weeks. After that, high-FODMAP foods are restarted gradually, one at a time, so you can find out which may be causing symptoms.  A low-FODMAP eating plan can be complicated. It is best to work with a dietitian who has experience with this type of plan. This information is not intended to replace advice given to you by your health care provider. Make sure you discuss any questions you have with your health care provider. Document Revised: 04/18/2017 Document Reviewed: 12/31/2016 Elsevier Patient Education  [place].  Influenza (Flu) Vaccine (Inactivated or Recombinant): What You Need to Know 1. Why get vaccinated? Influenza vaccine can prevent influenza (flu). Flu is a contagious disease that spreads around the [place] every year, usually between October and May. Anyone can get the flu, but it is more dangerous for some people. Infants and young children, people 13 years of age and older, pregnant women, and people with certain health conditions or a weakened immune system are at greatest risk of flu complications. Pneumonia,  bronchitis, sinus infections and ear infections are examples of flu-related complications. If you have a medical condition, such as heart disease, cancer or diabetes, flu can make it worse. Flu can cause fever and chills, sore  throat, muscle aches, fatigue, cough, headache, and runny or stuffy nose. Some people may have vomiting and diarrhea, though this is more common in children than adults. Each year thousands of people in the [place] die from flu, and many more are hospitalized. Flu vaccine prevents millions of illnesses and flu-related visits to the doctor each year. 2. Influenza vaccine CDC recommends everyone 92 months of age and older get vaccinated every flu season. Children 6 months through 3 years of age may need 2 doses during a single flu season. Everyone else needs only 1 dose each flu season. It takes about 2 weeks for protection to develop after vaccination. There are many flu viruses, and they are always changing. Each year a new flu vaccine is made to protect against three or four viruses that are likely to cause disease in the upcoming flu season. Even when the vaccine doesn't exactly match these viruses, it may still provide some protection. Influenza vaccine does not cause flu. Influenza vaccine may be given at the same time as other vaccines. 3. Talk with your health care provider Tell your vaccine provider if the person getting the vaccine:  Has had an allergic reaction after a previous dose of influenza vaccine, or has any severe, life-threatening allergies.  Has ever had Guillain-Barr Syndrome (also called GBS). In some cases, your health care provider may decide to postpone influenza vaccination to a future visit. People with minor illnesses, such as a cold, may be vaccinated. People who are moderately or severely ill should usually wait until they recover before getting influenza vaccine. Your health care provider can give you more information. 4. Risks of a vaccine reaction  Soreness, redness, and swelling where shot is given, fever, muscle aches, and headache can happen after influenza vaccine.  There may be a very small increased risk of Guillain-Barr Syndrome (GBS) after  inactivated influenza vaccine (the flu shot). Young children who get the flu shot along with pneumococcal vaccine (PCV13), and/or DTaP vaccine at the same time might be slightly more likely to have a seizure caused by fever. Tell your health care provider if a child who is getting flu vaccine has ever had a seizure. People sometimes faint after medical procedures, including vaccination. Tell your provider if you feel dizzy or have vision changes or ringing in the ears. As with any medicine, there is a very remote chance of a vaccine causing a severe allergic reaction, other serious injury, or death. 5. What if there is a serious problem? An allergic reaction could occur after the vaccinated person leaves the clinic. If you see signs of a severe allergic reaction (hives, swelling of the face and throat, difficulty breathing, a fast heartbeat, dizziness, or weakness), call [id number] and get the person to the nearest hospital. For other signs that concern you, call your health care provider. Adverse reactions should be reported to the Vaccine Adverse Event Reporting System (VAERS). Your health care provider will usually file this report, or you can do it yourself. Visit the VAERS website at www.vaers.SamedayNews.es or call (609) 600-4221.VAERS is only for reporting reactions, and VAERS staff do not give medical advice. 6. The National Vaccine Injury Compensation Program The National Vaccine Injury Compensation Program (VICP) is a federal program that was created to compensate people who may  have been injured by certain vaccines. Visit the VICP website at GoldCloset.com.ee or call 506-173-0645 to learn about the program and about filing a claim. There is a time limit to file a claim for compensation. 7. How can I learn more?  Ask your healthcare provider.  Call your local or state health department.  Contact the Centers for Disease Control and Prevention (CDC): ? Call (843)672-8229  (1-800-CDC-INFO) or ? Visit CDC's https://gibson.com/ Vaccine Information Statement (Interim) Inactivated Influenza Vaccine (01/01/2018) This information is not intended to replace advice given to you by your health care provider. Make sure you discuss any questions you have with your health care provider. Document Revised: 08/25/2018 Document Reviewed: 01/05/2018 Elsevier Patient Education  [place].   Follow-up plan: Return in about 4 weeks (around 03/07/2020) for headaches follow up.  Clearnce Sorrel, DNP, APRN, FNP-BC [facility]

## 2020-02-11 LAB — TSH: TSH: 1.5 mIU/L

## 2020-02-11 LAB — COMPLETE METABOLIC PANEL WITH GFR
AG Ratio: 1.4 (calc) (ref 1.0–2.5)
ALT: 8 U/L (ref 6–29)
AST: 14 U/L (ref 10–30)
Albumin: 3.9 g/dL (ref 3.6–5.1)
Alkaline phosphatase (APISO): 34 U/L (ref 31–125)
BUN: 11 mg/dL (ref 7–25)
CO2: 27 mmol/L (ref 20–32)
Calcium: 9 mg/dL (ref 8.6–10.2)
Chloride: 104 mmol/L (ref 98–110)
Creat: 1.01 mg/dL (ref 0.50–1.10)
GFR, Est African American: 82 mL/min/{1.73_m2} (ref >=60)
GFR, Est Non African American: 71 mL/min/{1.73_m2} (ref >=60)
Globulin: 2.7 g/dL (calc) (ref 1.9–3.7)
Glucose, Bld: 78 mg/dL (ref 65–99)
Potassium: 4.1 mmol/L (ref 3.5–5.3)
Sodium: 137 mmol/L (ref 135–146)
Total Bilirubin: 0.6 mg/dL (ref 0.2–1.2)
Total Protein: 6.6 g/dL (ref 6.1–8.1)

## 2020-02-11 LAB — LIPID PANEL
Cholesterol: 137 mg/dL (ref <200)
HDL: 37 mg/dL — ABNORMAL LOW (ref >=50)
LDL Cholesterol (Calc): 85 mg/dL (calc)
Non-HDL Cholesterol (Calc): 100 mg/dL (calc) (ref <130)
Total CHOL/HDL Ratio: 3.7 (calc) (ref <5.0)
Triglycerides: 60 mg/dL (ref <150)

## 2020-02-11 LAB — CBC
HCT: 41.3 % (ref 35.0–45.0)
Hemoglobin: 14 g/dL (ref 11.7–15.5)
MCH: 29.5 pg (ref 27.0–33.0)
MCHC: 33.9 g/dL (ref 32.0–36.0)
MCV: 87.1 fL (ref 80.0–100.0)
MPV: 10.9 fL (ref 7.5–12.5)
Platelets: 186 10*3/uL (ref 140–400)
RBC: 4.74 10*6/uL (ref 3.80–5.10)
RDW: 12.4 % (ref 11.0–15.0)
WBC: 3.8 10*3/uL (ref 3.8–10.8)

## 2020-02-16 ENCOUNTER — Other Ambulatory Visit: Payer: Self-pay | Admitting: Medical-Surgical

## 2020-03-06 ENCOUNTER — Ambulatory Visit (INDEPENDENT_AMBULATORY_CARE_PROVIDER_SITE_OTHER): Payer: 59 | Admitting: Rehabilitative and Restorative Service Providers"

## 2020-03-06 ENCOUNTER — Other Ambulatory Visit: Payer: Self-pay

## 2020-03-06 DIAGNOSIS — R29818 Other symptoms and signs involving the nervous system: Secondary | ICD-10-CM

## 2020-03-06 DIAGNOSIS — R42 Dizziness and giddiness: Secondary | ICD-10-CM

## 2020-03-06 NOTE — Therapy (Signed)
[facility] [place] [address] [place], [place], [address] Phone: [phone]   Fax:  [phone]  Physical Therapy Evaluation  Patient Details  Name: Shardee Dieu MRN: [id number] Date of Birth: 08-05-1980 Referring Provider (PT): Christen Butter   Encounter Date: 03/06/2020   PT End of Session - 03/06/20 1324    Visit Number 1    Number of Visits 6    Date for PT Re-Evaluation 04/17/20    Authorization Type UHC    PT Start Time 0930    PT Stop Time 1017    PT Time Calculation (min) 47 min    Activity Tolerance Patient tolerated treatment well    Behavior During Therapy [facility] for tasks assessed/performed           Past Medical History:  Diagnosis Date   Anxiety and depression    Asthma    Depression    Endometriosis    Frequent headaches    GERD (gastroesophageal reflux disease)    Hx of varicella    IBS (irritable bowel syndrome)    Migraines    Panic attacks    Postpartum care following cesarean delivery (2/18) 07/08/2015   Vertigo 12/31/2013    Past Surgical History:  Procedure Laterality Date   CESAREAN SECTION N/A 07/08/2015   Procedure: CESAREAN SECTION;  Surgeon: Noland Fordyce, MD;  Location: [facility];  Service: Obstetrics;  Laterality: N/A;   NO PAST SURGERIES      There were no vitals filed for this visit.    Subjective Assessment - 03/06/20 0927    Subjective The patient reports onset of vertigo while driving noticing "motion data that I couldn't filter out" in July 2021.  She notes she had not driven a lot during covid when things were shut down.  These episodes would lead to a panic attack and she has limited her driving.  She is driving short distances, but is not tolerating highway driving well.  She also had some vertigo with rolling, or during quick head motions.  She describes difficulty watching her husband play video games.    Pertinent History Vestibular neuritis 2002, Anxiety and  depression, Asthma, Depression, Endometriosis, Frequent headaches, IBS (irritable bowel syndrome), Migraines, Panic attacks    Patient Stated Goals return to driving    Currently in Pain? No/denies              [facility] PT Assessment - 03/06/20 5027      Assessment   Medical Diagnosis vertigo    Referring Provider (PT) Christen Butter    Onset Date/Surgical Date 02/08/20      Balance Screen   Has the patient fallen in the past 6 months No    Has the patient had a decrease in activity level because of a fear of falling?  No    Is the patient reluctant to leave their home because of a fear of falling?  No      Home [employer] information [employer] residence      Prior Function   Level of Independence Independent      Observation/Other Assessments   Focus on Therapeutic Outcomes (FOTO)  36% limitation      High Level Balance   High Level Balance Comments eyes open solid surface x 30 seconds/ eyes closed x 3 seconds; eyes open compliant surface x 30 seconds, eyes closed x 3 seconds.  Vestibular Assessment - 03/06/20 0938      Vestibular Assessment   General Observation The patient walks independently without a device.  Diagonal movements (unloading dishes) is very challenging.      Symptom Behavior   Subjective history of current problem begin in July 2021 after having months of being at home more    Type of Dizziness  Spinning;Unsteady with head/body turns   "sensation of motion" like being on a boat   Frequency of Dizziness intermittent and episodic    Duration of Dizziness can las up to all day; symptoms reduce in intensity, but "feels off"    Symptom Nature Motion provoked;Variable    Aggravating Factors Turning head quickly;Rolling to right;Rolling to left;Driving    Relieving Factors Head stationary    History of similar episodes h/o vertigo after passing out in 2015      Oculomotor Exam   Oculomotor Alignment Normal    Ocular ROM WFLs     Spontaneous Absent    Gaze-induced  Absent    Smooth Pursuits Intact    Saccades Intact    Comment Convergence = has double vision at 8" from nose; Has a stygmatism *Has eye exam in  November for updated lens prescription      Vestibulo-Ocular Reflex   VOR 1 Head Only (x 1 viewing) Head impulse test= negative for corrective saccade; however     VOR 2 Head and Object (x 2 viewing) able to tolerate    VOR Cancellation Normal      Positional Testing   Dix-Hallpike Dix-Hallpike Right;Dix-Hallpike Left    Horizontal Canal Testing Horizontal Canal Right;Horizontal Canal Left      Dix-Hallpike Right   Dix-Hallpike Right Duration Patient experiences symtpoms of lightheadedness and off balance returning to sitting.    Dix-Hallpike Right Symptoms No nystagmus      Dix-Hallpike Left   Dix-Hallpike Left Duration Patient experiences symtpoms of lightheadedness and off balance returning to sitting.    Dix-Hallpike Left Symptoms No nystagmus      Horizontal Canal Right   Horizontal Canal Right Duration Gets a sense of wall movement that is mild in nature    Horizontal Canal Right Symptoms Normal      Horizontal Canal Left   Horizontal Canal Left Duration none    Horizontal Canal Left Symptoms Normal              Objective measurements completed on examination: See above findings.        Vestibular Treatment/Exercise - 03/06/20 1011      Vestibular Treatment/Exercise   Vestibular Treatment Provided Habituation;Gaze    Habituation Exercises Standing Horizontal Head Turns;Standing Vertical Head Turns    Gaze Exercises X1 Viewing Horizontal;X1 Viewing Vertical      Standing Horizontal Head Turns   Number of Reps  10      Standing Vertical Head Turns   Number of Reps  10      X1 Viewing Horizontal   Foot Position seated    Comments cues for speed and range of motion      X1 Viewing Vertical   Foot Position seated    Comments cues for speed and ROM                  PT Education - 03/06/20 1324    Education Details HEP    Person(s) Educated Patient    Methods Explanation;Demonstration;Handout    Comprehension Returned demonstration;Verbalized understanding  PT Long Term Goals - 03/06/20 1326      PT LONG TERM GOAL #1   Title The patient will be indep with HEP for gaze adaptation, multi-sensory balance.    Time 6    Period Weeks    Target Date 04/17/20      PT LONG TERM GOAL #2   Title The patient will reduce functional limitation from 36% to < or equal to 23%.    Time 6    Period Weeks    Target Date 04/17/20      PT LONG TERM GOAL #3   Title The patient will tolerate gaze adaptation x 60 seconds.    Time 6    Period Weeks    Target Date 04/17/20      PT LONG TERM GOAL #4   Title The patient will return to driving on highways and around town without reports of dizziness.    Time 6    Period Weeks    Target Date 04/17/20      PT LONG TERM GOAL #5   Title The patient will maintain standing with eyes closed x 30 seconds to demo dec'd reliance on visual system for balance inputs.    Time 6    Period Weeks    Target Date 04/17/20                  Plan - 03/06/20 1329    Clinical Impression Statement The patient is a 39 yo female presenting to OP physical therapy with h/o vertigo from a neuritis in 2002, and a fall in 2015 presenting with return of symptoms.  During evaluation, she has dec'd tolerance to VOR activities, visual reliance for balance with difficulty maintaining standing with eyes closed, and motion sensitivity.  PT to address to return to prior functional status and driving activities.    Personal Factors and Comorbidities Comorbidity 1    Comorbidities migraines    Examination-Activity Limitations Bed Mobility    Examination-Participation Restrictions Driving;Community Activity    Stability/Clinical Decision Making Stable/Uncomplicated    Clinical Decision Making Low    Rehab Potential Good     PT Frequency 1x / week    PT Duration 6 weeks    PT Treatment/Interventions ADLs/Self Care Home Management;Neuromuscular re-education;Balance training;Canalith Repostioning;Vestibular;Patient/family education;Therapeutic exercise    PT Next Visit Plan habituation for diagonals, gaze progression, multi-sensory balance, optokinetics    PT Home Exercise Plan Access Code: [email or web address]    Consulted and Agree with Plan of Care Patient           Patient will benefit from skilled therapeutic intervention in order to improve the following deficits and impairments:  Dizziness, Decreased balance, Decreased activity tolerance  Visit Diagnosis: Dizziness and giddiness  Other symptoms and signs involving the nervous system     Problem List Patient Active Problem List   Diagnosis Date Noted   Encounter for female birth control 06/01/2019   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   Arthralgia 06/09/2017   Abnormal laboratory test 09/07/2014   Asthma, mild persistent 03/08/2014   GERD (gastroesophageal reflux disease) 03/08/2014   Seasonal allergies 03/08/2014   IBS (irritable bowel syndrome) 03/08/2014   Anxiety and depression 03/08/2014   Chronic daily headache 12/31/2013   Vertigo 12/31/2013   Postconcussive syndrome 12/31/2013    Velvet Moomaw, PT 03/06/2020, 1:37 PM  [facility] [address] [place], [place], [address] Phone: [phone]   Fax:  [phone]  Name: Jhoselin Crume MRN: [id number] Date  of Birth: 1981-02-27

## 2020-03-06 NOTE — Patient Instructions (Signed)
Access Code: [email or web address] URL: [email or web address] Date: 03/06/2020 Prepared by: Margretta Ditty  Exercises Standing Gaze Stabilization with Head Rotation - 2 x daily - 7 x weekly - 1 sets - 1 reps - 30 seconds hold Standing Gaze Stabilization with Head Nod - 2 x daily - 7 x weekly - 1 sets - 1 reps - 30 seconds hold Standing with Head Rotation - 2 x daily - 7 x weekly - 1 sets - 10 reps Standing with Head Nod - 2 x daily - 7 x weekly - 1 sets - 10 reps Wide Stance with Eyes Closed - 2 x daily - 7 x weekly - 1 sets - 5 reps - 10 seconds hold

## 2020-03-07 ENCOUNTER — Ambulatory Visit: Payer: 59 | Admitting: Medical-Surgical

## 2020-03-17 ENCOUNTER — Other Ambulatory Visit: Payer: Self-pay | Admitting: Medical-Surgical

## 2020-03-20 ENCOUNTER — Encounter: Payer: Self-pay | Admitting: Medical-Surgical

## 2020-03-20 ENCOUNTER — Other Ambulatory Visit: Payer: Self-pay

## 2020-03-20 ENCOUNTER — Ambulatory Visit (INDEPENDENT_AMBULATORY_CARE_PROVIDER_SITE_OTHER): Payer: 59 | Admitting: Rehabilitative and Restorative Service Providers"

## 2020-03-20 DIAGNOSIS — R29818 Other symptoms and signs involving the nervous system: Secondary | ICD-10-CM | POA: Diagnosis not present

## 2020-03-20 DIAGNOSIS — R42 Dizziness and giddiness: Secondary | ICD-10-CM | POA: Diagnosis not present

## 2020-03-20 MED ORDER — ALBUTEROL SULFATE HFA 108 (90 BASE) MCG/ACT IN AERS: INHALATION_SPRAY | RESPIRATORY_TRACT | 3 refills | Status: DC

## 2020-03-20 NOTE — Patient Instructions (Signed)
Access Code: [email or web address] URL: [email or web address] Date: 03/20/2020 Prepared by: Margretta Ditty  Program Notes VESTIBULAR EXERCISE: These should only bring on symptoms that remain 4/10 or less. We will begin with lower repetitions.  If symptoms last for more than 15 minutes after exercise, then we will reduce further.   The ways to modify: 1) Divide exercises to do 1 exercise/ then rest for a few minutes to let symptoms settle to baseline.   2) Continue at 1x/per day to allow yourself to spread the exercises out.   If symptoms last >15 minutes or make dizziness >5/10, then try to do one exercise per day.   Exercises Standing Gaze Stabilization with Head Rotation - 1 x daily - 7 x weekly - 1 sets - 5 reps Standing with Head Nod - 1 x daily - 7 x weekly - 1 sets - 5 reps Wide Stance with Eyes Closed - 1 x daily - 7 x weekly - 1 sets - 3 reps - 10 seconds hold

## 2020-03-20 NOTE — Therapy (Signed)
[facility] [place] [address] [place], [place], [address] Phone: [phone]   Fax:  [phone]  Physical Therapy Treatment  Patient Details  Name: Kelsey [name] MRN: [id number] Date of Birth: [birth year] Referring Provider (PT): Christen Butter   Encounter Date: 03/20/2020   PT End of Session - 03/20/20 0930    Visit Number 2    Number of Visits 6    Date for PT Re-Evaluation 04/17/20    Authorization Type UHC    PT Start Time 0850    PT Stop Time 0922    PT Time Calculation (min) 32 min    Activity Tolerance Patient tolerated treatment well    Behavior During Therapy [facility] for tasks assessed/performed           Past Medical History:  Diagnosis Date   Anxiety and depression    Asthma    Depression    Endometriosis    Frequent headaches    GERD (gastroesophageal reflux disease)    Hx of varicella    IBS (irritable bowel syndrome)    Migraines    Panic attacks    Postpartum care following cesarean delivery (2/18) 07/08/2015   Vertigo 12/31/2013    Past Surgical History:  Procedure Laterality Date   CESAREAN SECTION N/A 07/08/2015   Procedure: CESAREAN SECTION;  Surgeon: Noland Fordyce, MD;  Location: [facility];  Service: Obstetrics;  Laterality: N/A;   NO PAST SURGERIES      There were no vitals filed for this visit.   Subjective Assessment - 03/20/20 0854    Subjective The patient reports that the exercises provoke a headache for her.  She also has noticed a correlation between headache and vestibular symptoms with a major episode on 10/23 (she had IBS, diarrhea, nausea, dizzy).  She initially thought it was a panic attack, but didn't have heart palpitations typically associated.  The headache after that episode lasted x days.    Pertinent History Vestibular neuritis 2002, Anxiety and depression, Asthma, Depression, Endometriosis, Frequent headaches, IBS (irritable bowel syndrome), Migraines, Panic attacks     Patient Stated Goals return to driving    Currently in Pain? No/denies              [facility] PT Assessment - 03/20/20 0857      Assessment   Medical Diagnosis vertigo    Referring Provider (PT) Christen Butter    Onset Date/Surgical Date 02/08/20    Next MD Visit eye exam on 11/12               Vestibular Assessment - 03/20/20 0857      Vestibular Assessment   General Observation The patient is taking medication for migraines (just doubled dose).  She reports mild pressure in her head (not painful, just aware of it).  No dizziness at rest.                    [facility] Adult PT Treatment/Exercise - 03/20/20 7096      Neuro Re-ed    Neuro Re-ed Details  Standing in corner with eyes closed; discussed modifications to current HEP to improve tolerance.            Vestibular Treatment/Exercise - 03/20/20 0859      Vestibular Treatment/Exercise   Vestibular Treatment Provided Habituation;Gaze    Habituation Exercises Standing Vertical Head Turns    Gaze Exercises X1 Viewing Horizontal      Standing Vertical Head Turns   Number of Reps  5      X1 Viewing Horizontal   Foot Position standing    Comments 8 repetitions; patient notes increased pressure.  PT was modifying to reduce intensity and duration of symptoms.  Also, removed x 1 vertical to reduce stimulation in a way that is more tolerable for the patient.  The patient notes visual blurring of target.  *Recommended 5 reps of this exercise at home.                 PT Education - 03/20/20 0930    Education Details modified HEP to imrpove tolerance    Person(s) Educated Patient    Methods Explanation;Demonstration;Handout    Comprehension Verbalized understanding;Returned demonstration               PT Long Term Goals - 03/06/20 1326      PT LONG TERM GOAL #1   Title The patient will be indep with HEP for gaze adaptation, multi-sensory balance.    Time 6    Period Weeks    Target Date 04/17/20       PT LONG TERM GOAL #2   Title The patient will reduce functional limitation from 36% to < or equal to 23%.    Time 6    Period Weeks    Target Date 04/17/20      PT LONG TERM GOAL #3   Title The patient will tolerate gaze adaptation x 60 seconds.    Time 6    Period Weeks    Target Date 04/17/20      PT LONG TERM GOAL #4   Title The patient will return to driving on highways and around town without reports of dizziness.    Time 6    Period Weeks    Target Date 04/17/20      PT LONG TERM GOAL #5   Title The patient will maintain standing with eyes closed x 30 seconds to demo dec'd reliance on visual system for balance inputs.    Time 6    Period Weeks    Target Date 04/17/20                 Plan - 03/20/20 0920    Clinical Impression Statement PT modified home exercise program to reduce intensity of exercises to allow for increased tolerance.  Patient is continuing with regular HA, worsening symptoms with HEP, and intermittent dizziness.  Highway driving is still most provocative stimulus.    Personal Factors and Comorbidities --    Comorbidities --    Examination-Activity Limitations --    Examination-Participation Restrictions --    Stability/Clinical Decision Making Stable/Uncomplicated    Rehab Potential Good    PT Frequency 1x / week    PT Duration 6 weeks    PT Treatment/Interventions ADLs/Self Care Home Management;Neuromuscular re-education;Balance training;Canalith Repostioning;Vestibular;Patient/family education;Therapeutic exercise    PT Next Visit Plan check on update after eye exam, modified HEP-- can we slowly increase intensity or reps?    PT Home Exercise Plan Access Code: [email or web address]    Consulted and Agree with Plan of Care Patient           Patient will benefit from skilled therapeutic intervention in order to improve the following deficits and impairments:  Dizziness, Decreased balance, Decreased activity tolerance  Visit Diagnosis: Dizziness  and giddiness  Other symptoms and signs involving the nervous system     Problem List Patient Active Problem List   Diagnosis Date Noted   Encounter for female  birth control 06/01/2019   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   Arthralgia 06/09/2017   Abnormal laboratory test 09/07/2014   Asthma, mild persistent 03/08/2014   GERD (gastroesophageal reflux disease) 03/08/2014   Seasonal allergies 03/08/2014   IBS (irritable bowel syndrome) 03/08/2014   Anxiety and depression 03/08/2014   Chronic daily headache 12/31/2013   Vertigo 12/31/2013   Postconcussive syndrome 12/31/2013    Kamalani Mastro , PT 03/20/2020, 9:31 AM  [facility] [address] [place], [place], [address] Phone: [phone]   Fax:  [phone]  Name: Dion Parrow MRN: [id number] Date of Birth: Jun 02, 1980

## 2020-03-27 ENCOUNTER — Encounter: Payer: 59 | Admitting: Rehabilitative and Restorative Service Providers"

## 2020-04-03 ENCOUNTER — Ambulatory Visit (INDEPENDENT_AMBULATORY_CARE_PROVIDER_SITE_OTHER): Payer: 59 | Admitting: Rehabilitative and Restorative Service Providers"

## 2020-04-03 ENCOUNTER — Other Ambulatory Visit: Payer: Self-pay

## 2020-04-03 DIAGNOSIS — R29818 Other symptoms and signs involving the nervous system: Secondary | ICD-10-CM | POA: Diagnosis not present

## 2020-04-03 DIAGNOSIS — R42 Dizziness and giddiness: Secondary | ICD-10-CM

## 2020-04-03 NOTE — Patient Instructions (Signed)
Access Code: [email or web address] URL: [email or web address] Date: 04/03/2020 Prepared by: Margretta Ditty  Program Notes WALKING: begin at 10 minute intervals 1x/day to increase tolerance to visual flow.  VESTIBULAR EXERCISE: These should only bring on symptoms that remain 4/10 or less. We will begin with lower repetitions.  If symptoms last for more than 15 minutes after exercise, then we will reduce further.   The ways to modify: 1) Divide exercises to do 1 exercise/ then rest for a few minutes to let symptoms settle to baseline.   2) Continue at 1x/per day to allow yourself to spread the exercises out.   If symptoms last >15 minutes or make dizziness >5/10, then try to do one exercise per day.   Exercises Standing Gaze Stabilization with Head Rotation - 1 x daily - 7 x weekly - 1 sets - 10 reps Standing with Head Nod - 1 x daily - 7 x weekly - 1 sets - 5 reps Wide Stance with Eyes Closed - 1 x daily - 7 x weekly - 1 sets - 3 reps - 10 seconds hold Half turn in place - 1 x daily - 7 x weekly - 1 sets - 3 reps Brandt-Daroff Vestibular Exercise - 2 x daily - 7 x weekly - 1 sets - 2-3 reps

## 2020-04-03 NOTE — Therapy (Signed)
[facility] [address] [place], [place], [address] Phone: [phone]   Fax:  [phone]  Physical Therapy Treatment  Patient Details  Name: Kelsey [name] MRN: [id number] Date of Birth: 23-Nov-1980 Referring Provider (PT): Christen Butter   Encounter Date: 04/03/2020   PT End of Session - 04/03/20 1256    Visit Number 3    Number of Visits 6    Date for PT Re-Evaluation 04/17/20    Authorization Type UHC    PT Start Time 0935    PT Stop Time 1017    PT Time Calculation (min) 42 min    Activity Tolerance Patient tolerated treatment well    Behavior During Therapy [facility] for tasks assessed/performed           Past Medical History:  Diagnosis Date   Anxiety and depression    Asthma    Depression    Endometriosis    Frequent headaches    GERD (gastroesophageal reflux disease)    Hx of varicella    IBS (irritable bowel syndrome)    Migraines    Panic attacks    Postpartum care following cesarean delivery (2/18) 07/08/2015   Vertigo 12/31/2013    Past Surgical History:  Procedure Laterality Date   CESAREAN SECTION N/A 07/08/2015   Procedure: CESAREAN SECTION;  Surgeon: Noland Fordyce, MD;  Location: [facility];  Service: Obstetrics;  Laterality: N/A;   NO PAST SURGERIES      There were no vitals filed for this visit.   Subjective Assessment - 04/03/20 0937    Subjective The patient reports she is not driving as much over the past 2 weeks.  She finds it easier to drive at night and attributes it to less visual input coming in.  She notices occasional sensation of spinning with eyes closed.  She had her eye exam last week and feels current symptoms are not visually related.  She is getting a HA with driving 8 minutes to pick her son up during the week.    Pertinent History Vestibular neuritis 2002, Anxiety and depression, Asthma, Depression, Endometriosis, Frequent headaches, IBS (irritable bowel syndrome),  Migraines, Panic attacks    Patient Stated Goals return to driving    Currently in Pain? Yes    Pain Score 4     Pain Location Head    Pain Descriptors / Indicators Headache              [facility] PT Assessment - 04/03/20 0949      Assessment   Medical Diagnosis vertigo    Referring Provider (PT) Christen Butter    Onset Date/Surgical Date 02/08/20                          Vestibular Treatment/Exercise - 04/03/20 0950      Vestibular Treatment/Exercise   Vestibular Treatment Provided Gaze;Habituation    Habituation Exercises 360 degree Turns;180 degree Turns;Standing Horizontal Head Turns;Standing Vertical Head Turns    Gaze Exercises X1 Viewing Horizontal;X1 Viewing Vertical      Standing Horizontal Head Turns   Number of Reps  5      Standing Vertical Head Turns   Number of Reps  5      180 degree Turns   Number of Reps  3    Symptom Description  more manageable than 360 degree turn      360 degree Turns   Number of Reps  3  COMMENT improves with 3rd repetition to the right; gets pressure message to the L on rep 1.       X1 Viewing Horizontal   Foot Position standing    Comments letter blurs opposite the direction of head motion; tolerates 10 reps      X1 Viewing Vertical   Foot Position standing    Comments letter blurs opposite the direction of head motion; tolerates 10 reps                 PT Education - 04/03/20 1256    Education Details modified HEP adding 180 degree turns, habituation brandt daroff    Person(s) Educated Patient    Methods Explanation;Demonstration;Handout    Comprehension Returned demonstration;Verbalized understanding               PT Long Term Goals - 03/06/20 1326      PT LONG TERM GOAL #1   Title The patient will be indep with HEP for gaze adaptation, multi-sensory balance.    Time 6    Period Weeks    Target Date 04/17/20      PT LONG TERM GOAL #2   Title The patient will reduce functional  limitation from 36% to < or equal to 23%.    Time 6    Period Weeks    Target Date 04/17/20      PT LONG TERM GOAL #3   Title The patient will tolerate gaze adaptation x 60 seconds.    Time 6    Period Weeks    Target Date 04/17/20      PT LONG TERM GOAL #4   Title The patient will return to driving on highways and around town without reports of dizziness.    Time 6    Period Weeks    Target Date 04/17/20      PT LONG TERM GOAL #5   Title The patient will maintain standing with eyes closed x 30 seconds to demo dec'd reliance on visual system for balance inputs.    Time 6    Period Weeks    Target Date 04/17/20                 Plan - 04/03/20 1257    Clinical Impression Statement The patient is tolerating increased activity today in therapy.  Overall, she notes HA onset in relation to dizziness and HA is provoked with driving tasks.  PT recommended f/u with MD to consider return to neurology to manage HAs.  Patient has recently had vision assessed.    Stability/Clinical Decision Making Stable/Uncomplicated    Rehab Potential Good    PT Frequency 1x / week    PT Duration 6 weeks    PT Treatment/Interventions ADLs/Self Care Home Management;Neuromuscular re-education;Balance training;Canalith Repostioning;Vestibular;Patient/family education;Therapeutic exercise    PT Next Visit Plan modified HEP-- can we slowly increase intensity or reps?    PT Home Exercise Plan Access Code: [email or web address]    Recommended Other Services possible f/u for HAs due to HA when driving.    Consulted and Agree with Plan of Care Patient           Patient will benefit from skilled therapeutic intervention in order to improve the following deficits and impairments:  Dizziness, Decreased balance, Decreased activity tolerance  Visit Diagnosis: Dizziness and giddiness  Other symptoms and signs involving the nervous system     Problem List Patient Active Problem List   Diagnosis Date Noted    Encounter for  female birth control 06/01/2019   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   Arthralgia 06/09/2017   Abnormal laboratory test 09/07/2014   Asthma, mild persistent 03/08/2014   GERD (gastroesophageal reflux disease) 03/08/2014   Seasonal allergies 03/08/2014   IBS (irritable bowel syndrome) 03/08/2014   Anxiety and depression 03/08/2014   Chronic daily headache 12/31/2013   Vertigo 12/31/2013   Postconcussive syndrome 12/31/2013    Skyler Carel, PT 04/03/2020, 1:01 PM  [facility] [address] [place], [place], [address] Phone: [phone]   Fax:  [phone]  Name: Kelsey [name] MRN: [id number] Date of Birth: 06-Nov-1980

## 2020-04-11 ENCOUNTER — Other Ambulatory Visit: Payer: Self-pay | Admitting: Medical-Surgical

## 2020-04-21 ENCOUNTER — Encounter: Payer: Self-pay | Admitting: Rehabilitative and Restorative Service Providers"

## 2020-04-21 ENCOUNTER — Other Ambulatory Visit: Payer: Self-pay

## 2020-04-21 ENCOUNTER — Ambulatory Visit (INDEPENDENT_AMBULATORY_CARE_PROVIDER_SITE_OTHER): Payer: 59 | Admitting: Rehabilitative and Restorative Service Providers"

## 2020-04-21 DIAGNOSIS — R42 Dizziness and giddiness: Secondary | ICD-10-CM | POA: Diagnosis not present

## 2020-04-21 DIAGNOSIS — R29818 Other symptoms and signs involving the nervous system: Secondary | ICD-10-CM

## 2020-04-21 NOTE — Therapy (Addendum)
[facility] [place], [place], [address] Phone: [phone]   Fax:  [phone]  Physical Therapy Treatment and On hold note/ Discharge Summary  Patient Details  Name: Kelsey [name] MRN: [id number] Date of Birth: 07-16-1980 Referring Provider (PT): Samuel Bouche   Encounter Date: 04/21/2020   PT End of Session - 04/21/20 0945    Visit Number 4    Number of Visits 6    Date for PT Re-Evaluation 06/02/20    Authorization Type UHC    PT Start Time [phone]    PT Stop Time 1016    PT Time Calculation (min) 38 min    Activity Tolerance Patient tolerated treatment well    Behavior During Therapy [facility] for tasks assessed/performed           Past Medical History:  Diagnosis Date  . Anxiety and depression   . Asthma   . Depression   . Endometriosis   . Frequent headaches   . GERD (gastroesophageal reflux disease)   . Hx of varicella   . IBS (irritable bowel syndrome)   . Migraines   . Panic attacks   . Postpartum care following cesarean delivery (2/18) 07/08/2015  . Vertigo 12/31/2013    Past Surgical History:  Procedure Laterality Date  . CESAREAN SECTION N/A 07/08/2015   Procedure: CESAREAN SECTION;  Surgeon: Aloha Gell, MD;  Location: [facility];  Service: Obstetrics;  Laterality: N/A;  . NO PAST SURGERIES      There were no vitals filed for this visit.   Subjective Assessment - 04/21/20 0940    Subjective The patient reports she drove 20 minutes into [place] with husband in the car (in case she felt bad).  She notes she has IBS, dizziness, and multiple factors contribute to difficulties driving.  She has daily head pressure, severe head pain is every 3 months.    Pertinent History Vestibular neuritis 2002, Anxiety and depression, Asthma, Depression, Endometriosis, Frequent headaches, IBS (irritable bowel syndrome), Migraines, Panic attacks    Patient Stated Goals return to driving    Currently in Pain?  Yes   nausea and HA from yesterday due to IBS   Pain Score 2     Pain Location Head    Pain Descriptors / Indicators Headache    Pain Type Chronic pain    Pain Onset More than a month ago    Aggravating Factors  has nausea at rest              [facility] PT Assessment - 04/21/20 0945      Assessment   Medical Diagnosis vertigo    Referring Provider (PT) Samuel Bouche    Onset Date/Surgical Date 02/08/20                          Vestibular Treatment/Exercise - 04/21/20 0945      Vestibular Treatment/Exercise   Vestibular Treatment Provided Habituation;Gaze    Habituation Exercises Laruth Bouchard Daroff;Standing Horizontal Head Turns;Standing Vertical Head Turns;180 degree Turns    Gaze Exercises X1 Viewing Horizontal;X1 Viewing Vertical      Nestor Lewandowsky   Number of Reps  3    Symptom Description  gets a sensation of nausea and dizziness with return to sitting; sits to allow symptoms to settle      Standing Horizontal Head Turns   Number of Reps  5      Standing Vertical Head Turns   Number of  Reps  5      180 degree Turns   Number of Reps  3      360 degree Turns   Number of Reps  3      X1 Viewing Horizontal   Foot Position standing    Reps 10    Comments letter feels like it is vibrating and moving today with this exercise; performing at a slow pace      X1 Viewing Vertical   Foot Position standing    Reps 10    Comments vertical is able to be performed at a faster pace                 PT Education - 04/21/20 1301    Education Details modfied HEP    Person(s) Educated Patient    Methods Explanation;Demonstration;Handout    Comprehension Verbalized understanding;Returned demonstration               PT Long Term Goals - 04/21/20 1301      PT LONG TERM GOAL #1   Title The patient will be indep with HEP for gaze adaptation, multi-sensory balance.  (addended date on all unmet LTGs)    Time 6    Period Weeks    Status Achieved    Target  Date 06/02/20      PT LONG TERM GOAL #2   Title The patient will reduce functional limitation from 36% to < or equal to 23%.    Time 6    Period Weeks    Status Revised    Target Date 06/02/20      PT LONG TERM GOAL #3   Title The patient will tolerate gaze adaptation x 60 seconds.    Baseline can tolerate 10 reps of VOR x 1 viewing    Time 6    Period Weeks    Status On-going    Target Date 06/02/20      PT LONG TERM GOAL #4   Title The patient will return to driving on highways and around town without reports of dizziness.    Time 6    Period Weeks    Status On-going    Target Date 06/02/20      PT LONG TERM GOAL #5   Title The patient will maintain standing with eyes closed x 30 seconds to demo dec'd reliance on visual system for balance inputs.    Time 6    Period Weeks    Status Achieved                 Plan - 04/21/20 1302    Clinical Impression Statement The patient has met 2 LTG.  PT renewing to cover today's visit and plan to hold to allow time for patient to f/u with MD regarding regular HA as this may be contributing to symptoms.    Stability/Clinical Decision Making Stable/Uncomplicated    Rehab Potential Good    PT Frequency 1x / week    PT Duration 6 weeks    PT Treatment/Interventions ADLs/Self Care Home Management;Neuromuscular re-education;Balance training;Canalith Repostioning;Vestibular;Patient/family education;Therapeutic exercise    PT Next Visit Plan on hold    PT Home Exercise Plan Access Code: [email or web address]    Consulted and Agree with Plan of Care Patient           Patient will benefit from skilled therapeutic intervention in order to improve the following deficits and impairments:  Dizziness, Decreased balance, Decreased activity tolerance  Visit Diagnosis: Other symptoms and  signs involving the nervous system  Dizziness and giddiness     Problem List Patient Active Problem List   Diagnosis Date Noted  . Encounter for female birth  control 06/01/2019  . 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 Arthralgia 06/09/2017  . Abnormal laboratory test 09/07/2014  . Asthma, mild persistent 03/08/2014  . GERD (gastroesophageal reflux disease) 03/08/2014  . Seasonal allergies 03/08/2014  . IBS (irritable bowel syndrome) 03/08/2014  . Anxiety and depression 03/08/2014  . Chronic daily headache 12/31/2013  . Vertigo 12/31/2013  . Postconcussive syndrome 12/31/2013    PHYSICAL THERAPY DISCHARGE SUMMARY  Visits from Start of Care: 4  Current functional level related to goals / functional outcomes: See goals above-partially met   Remaining deficits: Continued HA limiting intervention by PT.   Education / Equipment: HEP  Plan: Patient agrees to discharge.  Patient goals were partially met. Patient is being discharged due to meeting the stated rehab goals.  ?????         Thank you for the referral of this patient. Rudell Cobb, MPT  [place], PT 04/21/2020, 1:07 PM  [facility] [address] [place], [place], [address] Phone: [phone]   Fax:  [phone]  Name: Kelsey [name] MRN: [id number] Date of Birth: 10-22-1980

## 2020-04-21 NOTE — Patient Instructions (Signed)
Access Code: [email or web address] URL: [email or web address] Date: 04/21/2020 Prepared by: Margretta Ditty  Program Notes WALKING: begin at 10 minute intervals 1x/day to increase tolerance to visual flow.  VESTIBULAR EXERCISE: These should only bring on symptoms that remain 4/10 or less. We will begin with lower repetitions.  If symptoms last for more than 15 minutes after exercise, then we will reduce further.   The ways to modify: 1) Divide exercises to do 1 exercise/ then rest for a few minutes to let symptoms settle to baseline.   2) Continue at 1x/per day to allow yourself to spread the exercises out.   If symptoms last >15 minutes or make dizziness >5/10, then try to do one exercise per day.   Exercises Standing Gaze Stabilization with Head Rotation - 1 x daily - 7 x weekly - 1 sets - 10 reps Standing with Head Nod - 1 x daily - 7 x weekly - 1 sets - 5 reps Romberg Stance with Eyes Closed - 2 x daily - 7 x weekly - 1 sets - 3 reps - 10-15 seconds hold Turning in Corner 360 - 1 x daily - 7 x weekly - 1 sets - 3 reps Brandt-Daroff Vestibular Exercise - 2 x daily - 7 x weekly - 1 sets - 2-3 reps

## 2020-04-28 ENCOUNTER — Other Ambulatory Visit: Payer: Self-pay | Admitting: Medical-Surgical

## 2020-05-01 ENCOUNTER — Telehealth (INDEPENDENT_AMBULATORY_CARE_PROVIDER_SITE_OTHER): Payer: 59 | Admitting: Medical-Surgical

## 2020-05-01 ENCOUNTER — Encounter: Payer: Self-pay | Admitting: Medical-Surgical

## 2020-05-01 DIAGNOSIS — R519 Headache, unspecified: Secondary | ICD-10-CM

## 2020-05-01 DIAGNOSIS — J069 Acute upper respiratory infection, unspecified: Secondary | ICD-10-CM

## 2020-05-01 DIAGNOSIS — K580 Irritable bowel syndrome with diarrhea: Secondary | ICD-10-CM | POA: Diagnosis not present

## 2020-05-01 MED ORDER — AMITRIPTYLINE HCL 25 MG PO TABS: 25.0000 mg | ORAL_TABLET | Freq: Every day | ORAL | 1 refills | Status: DC

## 2020-05-01 MED ORDER — DICYCLOMINE HCL 10 MG PO CAPS
10.0000 mg | ORAL_CAPSULE | Freq: Two times a day (BID) | ORAL | 1 refills | Status: DC | PRN
Start: 2020-05-01 — End: 2020-05-26

## 2020-05-01 NOTE — Progress Notes (Signed)
Virtual Visit via Video Note  I connected with Kelsey [name] on 05/01/20 at  1:20 PM EST by a video enabled telemedicine application and verified that I am speaking with the correct person using two identifiers.   I discussed the limitations of evaluation and management by telemedicine and the availability of in person appointments. The patient expressed understanding and agreed to proceed.  Patient location: home Provider locations: office  Subjective:    CC: migraine, IBS, upper respiratory symptoms  HPI: Pleasant 39 year old female presenting via MyChart video visit for the following:  Migraine: has been seeing PT for vertigo/dizziness. Now on pause as the dizziness seems to be related to her migraines. Taking Amitriptyline 25mg  nightly, tolerating well but notes some grogginess in the mornings. Does not see that the Amitriptyline has helped with her migraines. Would like a referral to the headache clinic for further evaluation.   IBS: notes the amitriptyline has been helpful with the IBS but she has flares of diarrhea if she has to take her dose later. 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   Her 8 year old son tested + for Flu B last Monday. She developed upper respiratory symptoms on Friday including sinus pain, right jaw pain, PND, sore throat, malaise, and body aches. Has not taken any OTC medications. Had her flu shot this year.   Past medical history, Surgical history, Family history not pertinant except as noted below, Social history, Allergies, and medications have been entered into the medical record, reviewed, and corrections made.   Review of Systems: See HPI for pertinent positives and negatives.   Objective:    General: Speaking clearly in complete sentences without any shortness of breath.  Alert and oriented x3.  Normal judgment. No apparent acute distress.  Impression and  Recommendations:    1. Irritable bowel syndrome with diarrhea Referring to GI at [facility] per patient request. Trial Bentyl 10-20mg  prn to see if this helps on afternoons where she has to delay the amitriptyline dose.  - Ambulatory referral to Gastroenterology  2. Chronic daily headache The amitriptyline is sedating and leaves her groggy so increasing the dose is not feasible. Discussed various alternatives to get better control of her headaches and therefore reduce the vestibular symptoms. Referring to headache clinic at patient request.  - AMB referral to headache clinic  3. Viral upper respiratory tract infection With her close proximity to her son and his recent Flu +, her symptoms are likely related to Flu. Discussed expected progress of illness. If no improvement in 7-10 days, advised to return for further evaluation.   I discussed the assessment and treatment plan with the patient. The patient was provided an opportunity to ask questions and all were answered. The patient agreed with the plan and demonstrated an understanding of the instructions.   The patient was advised to call back or seek an in-person evaluation if the symptoms worsen or if the condition fails to improve as anticipated.  20 minutes of non-face-to-face time was provided during this encounter.  Return if symptoms worsen or fail to improve.  9-10, DNP, APRN, FNP-BC [facility]

## 2020-05-03 ENCOUNTER — Other Ambulatory Visit: Payer: Self-pay | Admitting: Medical-Surgical

## 2020-05-03 ENCOUNTER — Ambulatory Visit: Payer: 59 | Admitting: Medical-Surgical

## 2020-05-03 DIAGNOSIS — Z30019 Encounter for initial prescription of contraceptives, unspecified: Secondary | ICD-10-CM

## 2020-05-16 ENCOUNTER — Encounter: Payer: Self-pay | Admitting: Gastroenterology

## 2020-05-25 ENCOUNTER — Other Ambulatory Visit: Payer: Self-pay | Admitting: Medical-Surgical

## 2020-06-06 ENCOUNTER — Ambulatory Visit: Payer: 59 | Admitting: Gastroenterology

## 2020-06-16 ENCOUNTER — Ambulatory Visit (INDEPENDENT_AMBULATORY_CARE_PROVIDER_SITE_OTHER): Payer: 59 | Admitting: Gastroenterology

## 2020-06-16 ENCOUNTER — Encounter: Payer: Self-pay | Admitting: Gastroenterology

## 2020-06-16 ENCOUNTER — Encounter: Payer: Self-pay | Admitting: Medical-Surgical

## 2020-06-16 VITALS — BP 110/76 | HR 89 | Ht 65.0 in | Wt 143.1 lb

## 2020-06-16 DIAGNOSIS — K589 Irritable bowel syndrome without diarrhea: Secondary | ICD-10-CM

## 2020-06-16 DIAGNOSIS — F4000 Agoraphobia, unspecified: Secondary | ICD-10-CM

## 2020-06-16 DIAGNOSIS — R110 Nausea: Secondary | ICD-10-CM | POA: Diagnosis not present

## 2020-06-16 DIAGNOSIS — F411 Generalized anxiety disorder: Secondary | ICD-10-CM

## 2020-06-16 DIAGNOSIS — R197 Diarrhea, unspecified: Secondary | ICD-10-CM | POA: Diagnosis not present

## 2020-06-16 NOTE — Patient Instructions (Signed)
If you are age 38 or older, your body mass index should be between 23-30. Your Body mass index is 23.82 kg/m. If this is out of the aforementioned range listed, please consider follow up with your Primary Care Provider.  If you are age 41 or younger, your body mass index should be between 19-25. Your Body mass index is 23.82 kg/m. If this is out of the aformentioned range listed, please consider follow up with your Primary Care Provider.   We will send a referral to behavior health  Call with any questions.   It was a pleasure to see you today!  Vito Cirigliano, D.O.

## 2020-06-16 NOTE — Progress Notes (Signed)
Chief Complaint: IBS, diarrhea, nausea   Referring Provider:     Samuel Bouche, NP   HPI:     Kelsey [name] is a 40 y.o. female with a history of anxiety/depression, asthma, endometriosis, GERD, migraines, panic attacks, history of seizures, IBS, referred to the [facility]ology Clinic for evaluation of reported history of IBS-D, and nausea.  Reports was diagnosed with IBS-D in 2009, started on Elavil with good control. Reports sxs recurred after going back out in public after pandemic shut downs. Worse with being in public places, public speaking, travel. Described as increased urgency, frequency. No pain.  Occasional +nausea without emesis. No hematochezia.  Will have associated panic attacks- shaking hands, palpitations/racing heart, facial flushing.   When in quarantine, feels fine. Has gone back to self quarantining for last month or so since she feels better.  Last episode of GI symptoms was about 6 weeks ago.   Still taking Elavil 25 mg qhs.   Had previously used Ativan for panic attacks, but no current prn Rx.     -Normal CBC and CMP in 01/2020.  No recent abdominal imaging for review.  EGD in 2009 at [place] only n/f [facility] per patient. No prior colonoscopy.   Was seen by Dr. Deatra Ina in 08/2014 for similar symptoms of anxiety related nausea, but was also having constipation controlled with fiber.  Also with history of asymptomatic lipase elevation, which was normal on repeat and normal again in 2019.   Past Medical History:  Diagnosis Date  . Anxiety and depression   . Asthma   . Brain injury ([place]) 2014  . Depression   . Endometriosis   . Frequent headaches   . GERD (gastroesophageal reflux disease)   . Hx of varicella   . IBS (irritable bowel syndrome)   . Migraines   . Panic attacks   . Postpartum care following cesarean delivery (2/18) 07/08/2015  . Seizure ([place])   . Vertigo 12/31/2013     Past Surgical History:  Procedure Laterality Date  . CESAREAN  SECTION N/A 07/08/2015   Procedure: CESAREAN SECTION;  Surgeon: Aloha Gell, MD;  Location: [facility];  Service: Obstetrics;  Laterality: N/A;  . ESOPHAGOGASTRODUODENOSCOPY     [facility] 2009-2010  . NO PAST SURGERIES     Family History  Problem Relation Age of Onset  . Hypertension Mother   . Cancer Mother        multiple myeloma  . Diabetes Mother   . Hypertension Father   . Stroke Father   . Cancer Maternal Grandmother        CLL  . Post-traumatic stress disorder Maternal Uncle   . Crohn's disease Cousin        maternal grandfather sister daughter  . Colon cancer Neg Hx   . Esophageal cancer Neg Hx    Social History   Tobacco Use  . Smoking status: Never Smoker  . Smokeless tobacco: Never Used  Vaping Use  . Vaping Use: Never used  Substance Use Topics  . Alcohol use: No    Alcohol/week: 0.0 standard drinks  . Drug use: No   Current Outpatient Medications  Medication Sig Dispense Refill  . albuterol (VENTOLIN HFA) 108 (90 Base) MCG/ACT inhaler INHALE 2 PUFFS INTO THE LUNGS EVERY 4 (FOUR) HOURS AS NEEDED FOR WHEEZING. 6.7 g 3  . amitriptyline (ELAVIL) 25 MG tablet Take 1 tablet (25 mg total) by mouth at bedtime.  90 tablet 1  . BIOTIN PO Take by mouth.    . cetirizine (ZYRTEC) 10 MG tablet Take 10 mg by mouth daily.    . diclofenac sodium (VOLTAREN) 1 % GEL Apply 4 g topically 4 (four) times daily. To affected joint. 100 g 11  . fluticasone (FLONASE) 50 MCG/ACT nasal spray SPRAY 2 SPRAYS INTO EACH NOSTRIL EVERY DAY 16 mL 12  . JUNEL 1/20 1-20 MG-MCG tablet TAKE 1 (ONE) TABLET DAILY CONTINUOUS ACTIVE PILLS 84 tablet 3  . meclizine (ANTIVERT) 25 MG tablet Take 1 tablet (25 mg total) by mouth 3 (three) times daily as needed for dizziness. 30 tablet 0  . ondansetron (ZOFRAN-ODT) 4 MG disintegrating tablet Take 1 tablet (4 mg total) by mouth every 8 (eight) hours as needed for nausea or vomiting. 30 tablet 0  . pantoprazole (PROTONIX) 40 MG tablet Take 1 tablet (40  mg total) by mouth daily. 90 tablet 3  . Prenatal Vit-Fe Fumarate-FA (PRENATAL MULTIVITAMIN) TABS tablet Take 1 tablet by mouth daily at 12 noon.    . dicyclomine (BENTYL) 10 MG capsule TAKE 1-2 CAPSULES (10-20 MG TOTAL) BY MOUTH 2 (TWO) TIMES DAILY AS NEEDED FOR SPASMS. (Patient not taking: Reported on 06/16/2020) 60 capsule 1   No current facility-administered medications for this visit.   Allergies  Allergen Reactions  . Catfish [Fish Allergy] Shortness Of Breath  . Fish-Derived Products Shortness Of Breath  . Pineapple Hives  . Citric Acid     Per patient report  . Monosodium Glutamate     Headache  . Tomato Flavor  [Flavoring Agent]     Mouth sores  . Phenergan [Promethazine Hcl] Other (See Comments)    Restless motion     Review of Systems: All systems reviewed and negative except where noted in HPI.     Physical Exam:    Wt Readings from Last 3 Encounters:  06/16/20 143 lb 2 oz (64.9 kg)  02/08/20 140 lb 4.8 oz (63.6 kg)  09/25/19 140 lb (63.5 kg)    BP 110/76   Pulse 89   Ht '5\' 5"'  (1.651 m)   Wt 143 lb 2 oz (64.9 kg)   BMI 23.82 kg/m  Constitutional:  Pleasant, in no acute distress. Psychiatric: Normal mood and affect. Behavior is normal. EENT: Pupils normal.  Conjunctivae are normal. No scleral icterus. Neck supple. No cervical LAD. Cardiovascular: Normal rate, regular rhythm. No edema Pulmonary/chest: Effort normal and breath sounds normal. No wheezing, rales or rhonchi. Abdominal: Soft, nondistended, nontender. Bowel sounds active throughout. There are no masses palpable. No hepatomegaly. Neurological: Alert and oriented to person place and time. Skin: Skin is warm and dry. No rashes noted.   ASSESSMENT AND PLAN;   1) IBS-D 2) Anxiety disorder 3) Agoraphobia 4) Nausea without emesis  - Referral to [place] for assistance with stress coping mechanisms and possible additional medical management -Since her symptoms are essentially provoked  by anxiety, do not feel that additional GI medications, stool studies, or endoscopic procedures is required at this time - RTC prn    Lavena Bullion, DO, FACG  06/16/2020, 3:48 PM   Samuel Bouche, NP

## 2020-06-23 ENCOUNTER — Other Ambulatory Visit: Payer: Self-pay | Admitting: Medical-Surgical

## 2020-08-01 ENCOUNTER — Telehealth (INDEPENDENT_AMBULATORY_CARE_PROVIDER_SITE_OTHER): Payer: 59 | Admitting: Psychiatry

## 2020-08-01 ENCOUNTER — Encounter (HOSPITAL_COMMUNITY): Payer: Self-pay | Admitting: Psychiatry

## 2020-08-01 DIAGNOSIS — F410 Panic disorder [episodic paroxysmal anxiety] without agoraphobia: Secondary | ICD-10-CM

## 2020-08-01 DIAGNOSIS — F419 Anxiety disorder, unspecified: Secondary | ICD-10-CM

## 2020-08-01 MED ORDER — LORAZEPAM 0.5 MG PO TABS: 0.5000 mg | ORAL_TABLET | Freq: Every day | ORAL | 0 refills | Status: DC | PRN

## 2020-08-01 NOTE — Progress Notes (Signed)
Psychiatric Initial Adult Assessment   Patient Identification: Kelsey [name] MRN:  [id number] Date of Evaluation:  08/01/2020 Referral Source: GI  Chief Complaint:  establish care, anxiety Visit Diagnosis:    ICD-10-CM   1. Panic attacks  F41.0   2. Anxiety disorder, unspecified type  F41.9    Virtual Visit via Video Note  I connected with Kelsey [name] on 08/01/20 at 11:00 AM EDT by a video enabled telemedicine application and verified that I am speaking with the correct person using two identifiers.  Location: Patient: home Provider: office   I discussed the limitations of evaluation and management by telemedicine and the availability of in person appointments. The patient expressed understanding and agreed to proceed.     I discussed the assessment and treatment plan with the patient. The patient was provided an opportunity to ask questions and all were answered. The patient agreed with the plan and demonstrated an understanding of the instructions.   The patient was advised to call back or seek an in-person evaluation if the symptoms worsen or if the condition fails to improve as anticipated.  I provided 35 minutes of non-face-to-face time during this encounter.    History of Present Illness: Patient is a 40 years old currently married African-American female referred by GI doctor for establishment of care for possible anxiety.  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  She has noticed panic attacks triggers leading to panic attack if she has to present in that crowds in the past.  She is tolerating  Anxiety fair while doing webinar since 2021 or during the pandemic which are virtually  and she does not have significant anxiety while doing that.  While having the anxiety  symptoms in the past her GI symptoms got worse for which reason GI doctor wanted her to get evaluated for possible link of anxiety 2.irritable bowel syndrome She endorses worries but not excessive there are some psychosocial stressors related to some distant relationship at times with her husband who also exhibits OCD-like symptoms also has had a difficult time growing up with her mom. She suffers from vertigo, migraines being managed but at times anxiety was making it worse or vulnerable .  She endorses volunteer work keeping her busy and engaged but wants to have something to cope for her anxiety and also not to panic.  She was also having some difficulty traveling or panic attacks when traveling in car but she is not experiencing that significantly She does not endorse hopelessness helplessness or depressive symptoms on a day-to-day basis No psychotic symptoms  Aggravating factor: when in public to give webinar, past head injury, relationship at times Modifying factor: family,  Patient does not endorse using drugs or alcohol No past psychiatric admission suicidal attempt  Duration more so last few years   Past Psychiatric History: anxiety  Previous Psychotropic Medications: No   Substance Abuse History in the last 12 months:  No.  Consequences of Substance Abuse: NA  Past Medical History:  Past Medical History:  Diagnosis Date  . Anxiety and depression   . Asthma   . Brain injury ([place]) 2014  . Depression   . Endometriosis   . Frequent headaches   . GERD (gastroesophageal reflux disease)   . Hx of varicella   . IBS (irritable bowel syndrome)   .  Migraines   . Panic attacks   . Postpartum care following cesarean delivery (2/18) 07/08/2015  . Seizure ([place])   . Vertigo 12/31/2013    Past Surgical History:  Procedure Laterality Date  . CESAREAN SECTION N/A 07/08/2015   Procedure: CESAREAN SECTION;  Surgeon: Aloha Gell, MD;  Location: [facility];  Service: Obstetrics;   Laterality: N/A;  . ESOPHAGOGASTRODUODENOSCOPY     [facility] 2009-2010  . NO PAST SURGERIES      Family Psychiatric History: Mom: OCD possibel  Family History:  Family History  Problem Relation Age of Onset  . Hypertension Mother   . Cancer Mother        multiple myeloma  . Diabetes Mother   . Hypertension Father   . Stroke Father   . Cancer Maternal Grandmother        CLL  . Post-traumatic stress disorder Maternal Uncle   . Crohn's disease Cousin        maternal grandfather sister daughter  . Colon cancer Neg Hx   . Esophageal cancer Neg Hx     Social History:   Social History   Socioeconomic History  . Marital status: Married    Spouse name: Not on file  . Number of children: 0  . Years of education: Not on file  . Highest education level: Not on file  Occupational History  . Not on file  Tobacco Use  . Smoking status: Never Smoker  . Smokeless tobacco: Never Used  Vaping Use  . Vaping Use: Never used  Substance and Sexual Activity  . Alcohol use: No    Alcohol/week: 0.0 standard drinks  . Drug use: No  . Sexual activity: Yes    Partners: Male    Birth control/protection: Pill  Other Topics Concern  . 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 Catfish [Fish Allergy] Shortness Of Breath  . Fish-Derived Products Shortness Of Breath  .  Pineapple Hives  . Citric Acid     Per patient report  . Monosodium Glutamate     Headache  . Tomato Flavor  [Flavoring Agent]     Mouth sores  . Phenergan [Promethazine Hcl] Other (See Comments)    Restless motion    Metabolic Disorder Labs: Lab Results  Component Value Date   HGBA1C 5.2 03/08/2014   No results found for: PROLACTIN Lab Results  Component Value Date   CHOL 137 02/11/2020   TRIG 60 02/11/2020   HDL 37 (L) 02/11/2020   CHOLHDL 3.7 02/11/2020   VLDL 11.4 03/08/2014   LDLCALC 85 02/11/2020   LDLCALC 75 03/08/2014   Lab Results  Component Value Date   TSH 1.50 02/11/2020    Therapeutic Level Labs: No results found for:  LITHIUM No results found for: CBMZ No results found for: VALPROATE  Current Medications: Current Outpatient Medications  Medication Sig Dispense Refill  . LORazepam (ATIVAN) 0.5 MG tablet Take 1 tablet (0.5 mg total) by mouth daily as needed for anxiety. 30 tablet 0  . albuterol (VENTOLIN HFA) 108 (90 Base) MCG/ACT inhaler INHALE 2 PUFFS INTO THE LUNGS EVERY 4 (FOUR) HOURS AS NEEDED FOR WHEEZING. 6.7 g 3  . amitriptyline (ELAVIL) 25 MG tablet Take 1 tablet (25 mg total) by mouth at bedtime. 90 tablet 1  . BIOTIN PO Take by mouth.    . cetirizine (ZYRTEC) 10 MG tablet Take 10 mg by mouth daily.    . diclofenac sodium (VOLTAREN) 1 % GEL Apply 4 g topically 4 (four) times daily. To affected joint. 100 g 11  . dicyclomine (BENTYL) 10 MG capsule Take 1-2 capsules (10-20 mg total) by mouth 2 (two) times daily as needed for spasms. **PATIENT NEEDS OFFICE VISIT FOR ADDITIONAL REFILLS** 120 capsule 0  . fluticasone (FLONASE) 50 MCG/ACT nasal spray SPRAY 2 SPRAYS INTO EACH NOSTRIL EVERY DAY 16 mL 12  . JUNEL 1/20 1-20 MG-MCG tablet TAKE 1 (ONE) TABLET DAILY CONTINUOUS ACTIVE PILLS 84 tablet 3  . meclizine (ANTIVERT) 25 MG tablet Take 1 tablet (25 mg total) by mouth 3 (three) times daily as needed for dizziness. 30 tablet 0  . ondansetron (ZOFRAN-ODT)  4 MG disintegrating tablet Take 1 tablet (4 mg total) by mouth every 8 (eight) hours as needed for nausea or vomiting. 30 tablet 0  . pantoprazole (PROTONIX) 40 MG tablet Take 1 tablet (40 mg total) by mouth daily. 90 tablet 3  . Prenatal Vit-Fe Fumarate-FA (PRENATAL MULTIVITAMIN) TABS tablet Take 1 tablet by mouth daily at 12 noon.     No current facility-administered medications for this visit.     Psychiatric Specialty Exam: Review of Systems  Psychiatric/Behavioral: Negative for hallucinations and self-injury. The patient is nervous/anxious.     currently breastfeeding.There is no height or weight on file to calculate BMI.  General Appearance: Casual  Eye Contact:  Fair  Speech:  Normal Rate  Volume:  Normal  Mood:  Euthymic  Affect:  Full Range  Thought Process:  Goal Directed  Orientation:  Full (Time, Place, and Person)  Thought Content:  Rumination  Suicidal Thoughts:  No  Homicidal Thoughts:  No  Memory:  Immediate;   Fair Recent;   Fair  Judgement:  Fair  Insight:  Fair  Psychomotor Activity:  Normal  Concentration:  Concentration: Good and Attention Span: Good  Recall:  Good  Fund of Knowledge:Good  Language: Good  Akathisia:  No  Handed:    AIMS (if indicated):  not done  Assets:  Communication Skills Desire for Improvement Financial Resources/Insurance Housing  ADL's:  Intact  Cognition: WNL  Sleep:  Fair   Screenings: GAD-7   Flowsheet Row Office Visit from 11/19/2017 in [place]  Total GAD-7 Score 3    PHQ2-9   Flowsheet Row Video Visit from 08/01/2020 in [place] Office Visit from 02/08/2020 in [facility] Visit from 11/19/2017 in [place] Visit from 08/26/2016 in [place]  PHQ-2 Total Score 1 0 1 2  PHQ-9 Total Score -- -- 6 5    Flowsheet Row  Video Visit from 08/01/2020 in [place]  C-SSRS RISK CATEGORY No Risk      Assessment and Plan: as follows  Panic attacks; ativan has helped, since they are more so when doing webinar, can consider prn SSRI may cause headaches  anxiety unspecified: ellavil has helped, but sedating can consider lowering dose after discussion with Pcp as it helps migraines  Consider therapy to work on triggers, coping skills and distraction for anxiety  Fu 6 weeks or earlier if needed   Merian Capron, MD 3/15/202211:34 AM

## 2020-10-10 ENCOUNTER — Encounter (HOSPITAL_COMMUNITY): Payer: Self-pay | Admitting: Psychiatry

## 2020-10-10 ENCOUNTER — Ambulatory Visit (INDEPENDENT_AMBULATORY_CARE_PROVIDER_SITE_OTHER): Payer: 59 | Admitting: Psychiatry

## 2020-10-10 DIAGNOSIS — F419 Anxiety disorder, unspecified: Secondary | ICD-10-CM

## 2020-10-10 DIAGNOSIS — F410 Panic disorder [episodic paroxysmal anxiety] without agoraphobia: Secondary | ICD-10-CM

## 2020-10-10 NOTE — Progress Notes (Signed)
[place] Follow up visit  Patient Identification: Kelsey [name] MRN:  [id number] Date of Evaluation:  10/10/2020 Referral Source: GI  Chief Complaint:  establish care, anxiety Visit Diagnosis:    ICD-10-CM   1. Panic attacks  F41.0   2. Anxiety disorder, unspecified type  F41.9    Virtual Visit via Telephone Note  I connected with Kelsey [name] on 10/10/20 at  9:30 AM EDT by telephone and verified that I am speaking with the correct person using two identifiers.  Location: Patient: home Provider: office   I discussed the limitations, risks, security and privacy concerns of performing an evaluation and management service by telephone and the availability of in person appointments. I also discussed with the patient that there may be a patient responsible charge related to this service. The patient expressed understanding and agreed to proceed.       I discussed the assessment and treatment plan with the patient. The patient was provided an opportunity to ask questions and all were answered. The patient agreed with the plan and demonstrated an understanding of the instructions.   The patient was advised to call back or seek an in-person evaluation if the symptoms worsen or if the condition fails to improve as anticipated.  I provided 12  minutes of non-face-to-face time during this encounter.    History of Present Illness: Patient is a 40 years old currently married African-American female referred by GI doctor for establishment of care for possible anxiety.  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 does volunteer work Her anxiety is somewhat better GI symptoms are better also she is having a new medication for migraines  that is helpful  She continues take Elavil at night that helps her sleep Her anxiety while driving has improved Still difficult relationship with her husband trying to get a couple counselor  Has tried other medications or SSRIs that has not worked   Aggravating factor: when in public to give webinar, past head injury, relationship at times Modifying factor: Family Patient does not endorse using drugs or alcohol No past psychiatric admission suicidal attempt  Duration more so last few years   Past Psychiatric History: anxiety  Consequences of Substance Abuse: NA  Past Medical History:  Past Medical History:  Diagnosis Date  . Anxiety and depression   . Asthma   . Brain injury ([place]) 2014  . Depression   . Endometriosis   . Frequent headaches   . GERD (gastroesophageal reflux disease)   . Hx of varicella   . IBS (irritable bowel syndrome)   . Migraines   . Panic attacks   . Postpartum care following cesarean delivery (2/18) 07/08/2015  . Seizure ([place])   . Vertigo 12/31/2013    Past Surgical History:  Procedure Laterality Date  . CESAREAN SECTION N/A 07/08/2015   Procedure: CESAREAN SECTION;  Surgeon: Aloha Gell, MD;  Location: [facility];  Service: Obstetrics;  Laterality: N/A;  . ESOPHAGOGASTRODUODENOSCOPY     [facility] 2009-2010  . NO PAST SURGERIES      Family Psychiatric History: Mom: OCD possibel  Family History:  Family History  Problem Relation Age of Onset  . Hypertension Mother   . Cancer Mother  multiple myeloma  . Diabetes Mother   . Hypertension Father   . Stroke Father   . Cancer Maternal Grandmother        CLL  . Post-traumatic stress disorder Maternal Uncle   . Crohn's disease Cousin        maternal grandfather sister daughter  . Colon cancer Neg Hx   . Esophageal cancer Neg Hx     Social History:   Social History   Socioeconomic History  . Marital status: Married    Spouse name: Not on file  . Number of children: 0   . Years of education: Not on file  . Highest education level: Not on file  Occupational History  . Not on file  Tobacco Use  . Smoking status: Never Smoker  . Smokeless tobacco: Never Used  Vaping Use  . Vaping Use: Never used  Substance and Sexual Activity  . Alcohol use: No    Alcohol/week: 0.0 standard drinks  . Drug use: No  . Sexual activity: Yes    Partners: Male    Birth control/protection: Pill  Other Topics Concern  . 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 Catfish [Fish Allergy] Shortness Of Breath  . Fish-Derived Products Shortness Of Breath  . Pineapple Hives  . Citric Acid     Per patient report  . Monosodium Glutamate     Headache  . Tomato Flavor  [Flavoring Agent]     Mouth sores  . Phenergan [Promethazine Hcl] Other (See Comments)    Restless motion    Metabolic Disorder Labs: Lab Results  Component Value Date   HGBA1C 5.2 03/08/2014   No results found for: PROLACTIN Lab Results  Component Value Date   CHOL 137 02/11/2020   TRIG 60 02/11/2020   HDL 37 (L) 02/11/2020   CHOLHDL 3.7 02/11/2020   VLDL 11.4 03/08/2014   LDLCALC 85 02/11/2020   [place] 75 03/08/2014   Lab Results  Component Value Date   TSH 1.50 02/11/2020    Therapeutic Level Labs: No results found for: LITHIUM No results found for: CBMZ No results found for: VALPROATE  Current Medications: Current Outpatient Medications  Medication Sig Dispense Refill  .  albuterol (VENTOLIN HFA) 108 (90 Base) MCG/ACT inhaler INHALE 2 PUFFS INTO THE LUNGS EVERY 4 (FOUR) HOURS AS NEEDED FOR WHEEZING. 6.7 g 3  . amitriptyline (ELAVIL) 25 MG tablet Take 1 tablet (25 mg total) by mouth at bedtime. 90 tablet 1  . BIOTIN PO Take by mouth.    . cetirizine (ZYRTEC) 10 MG tablet Take 10 mg by mouth daily.    . diclofenac sodium (VOLTAREN) 1 % GEL Apply 4 g topically 4 (four) times daily. To affected joint. 100 g 11  . dicyclomine (BENTYL) 10 MG capsule Take 1-2 capsules (10-20 mg total) by mouth 2 (two) times daily  as needed for spasms. **PATIENT NEEDS OFFICE VISIT FOR ADDITIONAL REFILLS** 120 capsule 0  . fluticasone (FLONASE) 50 MCG/ACT nasal spray SPRAY 2 SPRAYS INTO EACH NOSTRIL EVERY DAY 16 mL 12  . JUNEL 1/20 1-20 MG-MCG tablet TAKE 1 (ONE) TABLET DAILY CONTINUOUS ACTIVE PILLS 84 tablet 3  . LORazepam (ATIVAN) 0.5 MG tablet Take 1 tablet (0.5 mg total) by mouth daily as needed for anxiety. 30 tablet 0  . meclizine (ANTIVERT) 25 MG tablet Take 1 tablet (25 mg total) by mouth 3 (three) times daily as needed for dizziness. 30 tablet 0  . ondansetron (ZOFRAN-ODT) 4 MG disintegrating tablet Take 1 tablet (4 mg total) by mouth every 8 (eight) hours as needed for nausea or vomiting. 30 tablet 0  . pantoprazole (PROTONIX) 40 MG tablet Take 1 tablet (40 mg total) by mouth daily. 90 tablet 3  . Prenatal Vit-Fe Fumarate-FA (PRENATAL MULTIVITAMIN) TABS tablet Take 1 tablet by mouth daily at 12 noon.     No current facility-administered medications for this visit.     Psychiatric Specialty Exam: Review of Systems  Psychiatric/Behavioral: Negative for hallucinations and self-injury.    currently breastfeeding.There is no height or weight on file to calculate BMI.  General Appearance:   Eye Contact:    Speech:  Normal Rate  Volume:  Normal  Mood:  Euthymic  Affect:  Full Range  Thought Process:  Goal Directed  Orientation:  Full (Time, Place, and Person)  Thought  Content:  Rumination  Suicidal Thoughts:  No  Homicidal Thoughts:  No  Memory:  Immediate;   Fair Recent;   Fair  Judgement:  Fair  Insight:  Fair  Psychomotor Activity:  Normal  Concentration:  Concentration: Good and Attention Span: Good  Recall:  Good  Fund of Knowledge:Good  Language: Good  Akathisia:  No  Handed:    AIMS (if indicated):  not done  Assets:  Communication Skills Desire for Improvement Financial Resources/Insurance Housing  ADL's:  Intact  Cognition: WNL  Sleep:  Fair   Screenings: GAD-7   [place] Office Visit from 11/19/2017 in [place]  Total GAD-7 Score 3    PHQ2-9   Flowsheet Row Video Visit from 08/01/2020 in [place] Office Visit from 02/08/2020 in [place] Visit from 11/19/2017 in [place] Visit from 08/26/2016 in [place]  PHQ-2 Total Score 1 0 1 2  PHQ-9 Total Score -- -- 6 5    [place] Office Visit from 10/10/2020 in [place] Video Visit from 08/01/2020 in [place] No Risk No Risk      Assessment and Plan: as follows  Panic attacks; improved she takes as needed Ativan still has medication left she can take half during the time when she has to present something but cautioned about it may be sedating  anxiety unspecified: Continue Elavil and a small dose of Ativan   Schedule therapy for individual and couples counseling she will give the office a call   Follow-up in 2 months or earlier if needed  Merian Capron, MD 5/24/20229:47 AM

## 2020-10-28 ENCOUNTER — Other Ambulatory Visit: Payer: Self-pay | Admitting: Medical-Surgical

## 2020-11-06 ENCOUNTER — Encounter: Payer: Self-pay | Admitting: Medical-Surgical

## 2020-11-06 ENCOUNTER — Other Ambulatory Visit: Payer: Self-pay

## 2020-11-06 MED ORDER — AMITRIPTYLINE HCL 25 MG PO TABS: 25.0000 mg | ORAL_TABLET | Freq: Every day | ORAL | 0 refills | Status: DC

## 2020-12-05 ENCOUNTER — Encounter (HOSPITAL_COMMUNITY): Payer: Self-pay | Admitting: Psychiatry

## 2020-12-05 ENCOUNTER — Ambulatory Visit (INDEPENDENT_AMBULATORY_CARE_PROVIDER_SITE_OTHER): Payer: 59 | Admitting: Psychiatry

## 2020-12-05 VITALS — BP 108/78 | Temp 98.3°F | Ht 65.0 in | Wt 142.0 lb

## 2020-12-05 DIAGNOSIS — F410 Panic disorder [episodic paroxysmal anxiety] without agoraphobia: Secondary | ICD-10-CM | POA: Diagnosis not present

## 2020-12-05 DIAGNOSIS — F419 Anxiety disorder, unspecified: Secondary | ICD-10-CM | POA: Diagnosis not present

## 2020-12-05 MED ORDER — LORAZEPAM 0.5 MG PO TABS: 0.5000 mg | ORAL_TABLET | Freq: Every day | ORAL | 0 refills | Status: DC | PRN

## 2020-12-05 MED ORDER — AMITRIPTYLINE HCL 25 MG PO TABS: 25.0000 mg | ORAL_TABLET | Freq: Every day | ORAL | 2 refills | Status: DC

## 2020-12-05 NOTE — Progress Notes (Signed)
[place] Follow up visit  Patient Identification: Kelsey [name] MRN:  [id number] Date of Evaluation:  12/05/2020 Referral Source: GI  Chief Complaint:  establish care, anxiety Visit Diagnosis:    ICD-10-CM   1. Anxiety disorder, unspecified type  F41.9     2. Panic attacks  F41.0         History of Present Illness: Patient is a 40 years old currently married African-American female referred by GI doctor for establishment of care for possible anxiety.  Patient has been suffering from irritable bowel syndrome She also has a head injury in the past leading to migraines and now is currently on disability since 2015  She does volunteer work,gets anxious around seminar times. Ativan has helped Still struggles with anxiety at times , related to relationship and son starting school , he is quite in school  Scheduled for therapy   Has tried other medications or SSRIs that has not worked   Aggravating factor: when in public to give webinar, past head injury,relationship stress Modifying factor: Family Patient does not endorse using drugs or alcohol No past psychiatric admission suicidal attempt  Duration more so last few years   Past Psychiatric History: anxiety  Consequences of Substance Abuse: NA  Past Medical History:  Past Medical History:  Diagnosis Date   Anxiety and depression    Asthma    Brain injury ([place]) 2014   Depression    Endometriosis    Frequent headaches    GERD (gastroesophageal reflux disease)    Hx of varicella    IBS (irritable bowel syndrome)    Migraines    Panic attacks    Postpartum care following cesarean delivery (2/18) 07/08/2015   Seizure ([place])    Vertigo 12/31/2013    Past Surgical History:  Procedure Laterality Date   CESAREAN SECTION N/A 07/08/2015   Procedure: CESAREAN SECTION;  Surgeon: Aloha Gell, MD;  Location: [facility];  Service: Obstetrics;  Laterality: N/A;   ESOPHAGOGASTRODUODENOSCOPY     [facility] 2009-2010   NO  PAST SURGERIES      Family Psychiatric History: Mom: OCD possibel  Family History:  Family History  Problem Relation Age of Onset   Hypertension Mother    Cancer Mother        multiple myeloma   Diabetes Mother    Hypertension Father    Stroke Father    Cancer Maternal Grandmother        CLL   Post-traumatic stress disorder Maternal Uncle    Crohn's disease Cousin        maternal grandfather sister daughter   Colon cancer Neg Hx    Esophageal cancer Neg Hx     Social History:   Social History   Socioeconomic History   Marital status: Married    Spouse name: Not on file   Number of children: 0   Years of education: Not on file   Highest education level: Not on file  Occupational History   Not on file  Tobacco Use   Smoking status: Never   Smokeless tobacco: Never  Vaping Use   Vaping Use: Never used  Substance and Sexual Activity   Alcohol use: No    Alcohol/week: 0.0 standard drinks   Drug use: No   Sexual activity: Yes    Partners: Male    Birth control/protection: Pill  Other Topics Concern   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  Home Situation: lives with husband      Spiritual Beliefs: Christian      Lifestyle: doing 20 minutes of exercise and a little weight lifting daily; diet is healthy            Social Determinants of Radio broadcast assistant Strain: Not on file  Food Insecurity: Not on file  Transportation Needs: Not on file  Physical Activity: Not on file  Stress: Not on file  Social Connections: Not on file     Allergies:   Allergies  Allergen Reactions   Catfish [Fish Allergy] Shortness Of Breath   Fish-Derived Products Shortness Of Breath   Pineapple Hives   Citric Acid     Per patient report   Monosodium Glutamate     Headache   Tomato Flavor  [Flavoring Agent]     Mouth sores   Phenergan [Promethazine Hcl] Other (See Comments)    Restless motion    Metabolic  Disorder Labs: Lab Results  Component Value Date   HGBA1C 5.2 03/08/2014   No results found for: PROLACTIN Lab Results  Component Value Date   CHOL 137 02/11/2020   TRIG 60 02/11/2020   HDL 37 (L) 02/11/2020   CHOLHDL 3.7 02/11/2020   VLDL 11.4 03/08/2014   LDLCALC 85 02/11/2020   LDLCALC 75 03/08/2014   Lab Results  Component Value Date   TSH 1.50 02/11/2020    Therapeutic Level Labs: No results found for: LITHIUM No results found for: CBMZ No results found for: VALPROATE  Current Medications: Current Outpatient Medications  Medication Sig Dispense Refill   albuterol (VENTOLIN HFA) 108 (90 Base) MCG/ACT inhaler INHALE 2 PUFFS INTO THE LUNGS EVERY 4 (FOUR) HOURS AS NEEDED FOR WHEEZING. 6.7 g 3   amitriptyline (ELAVIL) 25 MG tablet Take 1 tablet (25 mg total) by mouth at bedtime. **PATIENT NEEDS OFFICE VISIT FOR ADDITIONAL REFILLS** 30 tablet 2   BIOTIN PO Take by mouth.     cetirizine (ZYRTEC) 10 MG tablet Take 10 mg by mouth daily.     diclofenac sodium (VOLTAREN) 1 % GEL Apply 4 g topically 4 (four) times daily. To affected joint. 100 g 11   dicyclomine (BENTYL) 10 MG capsule Take 1-2 capsules (10-20 mg total) by mouth 2 (two) times daily as needed for spasms. **PATIENT NEEDS OFFICE VISIT FOR ADDITIONAL REFILLS** 120 capsule 0   fluticasone (FLONASE) 50 MCG/ACT nasal spray SPRAY 2 SPRAYS INTO EACH NOSTRIL EVERY DAY 16 mL 12   JUNEL 1/20 1-20 MG-MCG tablet TAKE 1 (ONE) TABLET DAILY CONTINUOUS ACTIVE PILLS 84 tablet 3   LORazepam (ATIVAN) 0.5 MG tablet Take 1 tablet (0.5 mg total) by mouth daily as needed for anxiety. 30 tablet 0   meclizine (ANTIVERT) 25 MG tablet Take 1 tablet (25 mg total) by mouth 3 (three) times daily as needed for dizziness. 30 tablet 0   ondansetron (ZOFRAN-ODT) 4 MG disintegrating tablet Take 1 tablet (4 mg total) by mouth every 8 (eight) hours as needed for nausea or vomiting. 30 tablet 0   pantoprazole (PROTONIX) 40 MG tablet Take 1 tablet (40 mg  total) by mouth daily. 90 tablet 3   Prenatal Vit-Fe Fumarate-FA (PRENATAL MULTIVITAMIN) TABS tablet Take 1 tablet by mouth daily at 12 noon.     No current facility-administered medications for this visit.     Psychiatric Specialty Exam: Review of Systems  Psychiatric/Behavioral:  Negative for hallucinations and self-injury.    Blood pressure 108/78, temperature 98.3 F (36.8 C), height  '5\' 5"'  (1.651 m), weight 142 lb (64.4 kg), currently breastfeeding.Body mass index is 23.63 kg/m.  General Appearance: casual  Eye Contact:  fair  Speech:  Normal Rate  Volume:  Normal  Mood:  Euthymic  Affect:  Full Range  Thought Process:  Goal Directed  Orientation:  Full (Time, Place, and Person)  Thought Content:  Rumination  Suicidal Thoughts:  No  Homicidal Thoughts:  No  Memory:  Immediate;   Fair Recent;   Fair  Judgement:  Fair  Insight:  Fair  Psychomotor Activity:  Normal  Concentration:  Concentration: Good and Attention Span: Good  Recall:  Good  Fund of Knowledge:Good  Language: Good  Akathisia:  No  Handed:    AIMS (if indicated):  not done  Assets:  Communication Skills Desire for Improvement Financial Resources/Insurance Housing  ADL's:  Intact  Cognition: WNL  Sleep:  Fair   Screenings: GAD-7    [place] Office Visit from 11/19/2017 in [place]  Total GAD-7 Score 3      PHQ2-9    Flowsheet Row Video Visit from 08/01/2020 in [place] Office Visit from 02/08/2020 in [place] Visit from 11/19/2017 in [place] Visit from 08/26/2016 in [place]  PHQ-2 Total Score 1 0 1 2  PHQ-9 Total Score -- -- 6 5      [place] Office Visit from 12/05/2020 in [place] Office Visit from 10/10/2020 in [place] Video Visit from 08/01/2020 in [place] No Risk No Risk No Risk       Assessment and Plan: as follows  Panic attacks; sporadic, ativan helps, discuss to take it before events   anxiety unspecified: better , work in therapy, continue elavil, ativan   Schedule therapy for individual and couples counseling she will give the office a call  Time spent face to face 91mn Follow-up in 2 months or earlier if needed  NMerian Capron MD 7/19/20229:18 AM

## 2020-12-08 ENCOUNTER — Encounter: Payer: Self-pay | Admitting: Medical-Surgical

## 2020-12-14 ENCOUNTER — Other Ambulatory Visit: Payer: Self-pay

## 2020-12-14 ENCOUNTER — Emergency Department (INDEPENDENT_AMBULATORY_CARE_PROVIDER_SITE_OTHER)
Admission: RE | Admit: 2020-12-14 | Discharge: 2020-12-14 | Disposition: A | Discharge status: Home / Self Care | Payer: 59 | Source: Ambulatory Visit | Attending: Family Medicine | Admitting: Family Medicine

## 2020-12-14 VITALS — BP 118/84 | HR 98 | Temp 99.4°F | Resp 14

## 2020-12-14 DIAGNOSIS — J22 Unspecified acute lower respiratory infection: Secondary | ICD-10-CM | POA: Diagnosis not present

## 2020-12-14 DIAGNOSIS — Z8709 Personal history of other diseases of the respiratory system: Secondary | ICD-10-CM

## 2020-12-14 MED ORDER — AZITHROMYCIN 250 MG PO TABS: 250.0000 mg | ORAL_TABLET | Freq: Every day | ORAL | 0 refills | Status: DC

## 2020-12-14 MED ORDER — PREDNISONE 20 MG PO TABS: 20.0000 mg | ORAL_TABLET | Freq: Two times a day (BID) | ORAL | 0 refills | Status: DC

## 2020-12-14 NOTE — Discharge Instructions (Addendum)
Continue to drink plenty of fluids Take the Z-Pak as prescribed Take the prednisone 2 times a day for 5 days May continue to use over-the-counter cough and cold medicine as needed Get plenty of rest Call or return for problems.  Here or PCP.

## 2020-12-14 NOTE — ED Triage Notes (Addendum)
5 days ago developed fever, chills and cough. Did an e-visit 7/23. Started Tamiflu 3-4 days ago. C/o non-productive cough and nasal drainage. Fever has resolved. H/o asthma

## 2020-12-14 NOTE — ED Provider Notes (Signed)
[name]    CSN: [id number] Arrival date & time: 12/14/20  1058      History   Chief Complaint No chief complaint on file.   HPI Kelsey [name] is a 40 y.o. female.   HPI  Patient is a 30-year-old son.  He has been very sick with an ear infection, cough, runny nose, vomiting for a week.  He has seen the pediatrician on Friday and again on Monday.  COVID testing was negative.  He is on antibiotics.  In spite of this mother states that he is still sick.  She has been having symptoms also.  Sinus congestion.  Postnasal drip.  Scratchy sore throat.  Hoarse voice.  Unremitting coughing that keeps her awake at night.  She is COVID vaccinated.  COVID test was negative.  She thinks her son was tested for flu as well.  He had a video visit and was given Tamiflu.  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   Arthralgia 06/09/2017   Abnormal laboratory test 09/07/2014   Asthma, mild persistent 03/08/2014   GERD (gastroesophageal reflux disease) 03/08/2014   Seasonal allergies 03/08/2014   Irritable bowel syndrome with diarrhea 03/08/2014   Anxiety and depression 03/08/2014   Chronic daily headache 12/31/2013   Vertigo 12/31/2013   Postconcussive syndrome 12/31/2013    Past Surgical History:  Procedure Laterality Date   CESAREAN SECTION N/A 07/08/2015   Procedure: CESAREAN SECTION;  Surgeon: Aloha Gell, MD;  Location: [facility];  Service: Obstetrics;  Laterality:  N/A;   ESOPHAGOGASTRODUODENOSCOPY     [facility] [id number]   NO PAST SURGERIES      OB History     Gravida  1   Para  1   Term  1   Preterm      AB      Living  1      SAB      IAB      Ectopic      Multiple  0   Live Births  1            Home Medications    Prior to Admission medications   Medication Sig Start Date End Date Taking? Authorizing Provider  azithromycin (ZITHROMAX) 250 MG tablet Take 1 tablet (250 mg total) by mouth daily. Take first 2 tablets together, then 1 every day until finished. 12/14/20  Yes Raylene Everts, MD  predniSONE (DELTASONE) 20 MG tablet Take 1 tablet (20 mg total) by mouth 2 (two) times daily with a meal. 12/14/20  Yes Raylene Everts, MD  albuterol (VENTOLIN HFA) 108 (90 Base) MCG/ACT inhaler INHALE 2 PUFFS INTO THE LUNGS EVERY 4 (FOUR) HOURS AS NEEDED FOR WHEEZING. 03/20/20   Samuel Bouche, NP  amitriptyline (ELAVIL) 25 MG tablet Take 1 tablet (25 mg total) by mouth at bedtime. **PATIENT NEEDS OFFICE VISIT  FOR ADDITIONAL REFILLS** 12/05/20   Merian Capron, MD  BIOTIN PO Take by mouth.    [provider]  cetirizine (ZYRTEC) 10 MG tablet Take 10 mg by mouth daily.    [provider]  diclofenac sodium (VOLTAREN) 1 % GEL Apply 4 g topically 4 (four) times daily. To affected joint. 06/09/17   Gregor Hams, MD  dicyclomine (BENTYL) 10 MG capsule Take 1-2 capsules (10-20 mg total) by mouth 2 (two) times daily as needed for spasms. **PATIENT NEEDS OFFICE VISIT FOR ADDITIONAL REFILLS** 06/23/20   Samuel Bouche, NP  fluticasone [facility]) 50 MCG/ACT nasal spray SPRAY 2 SPRAYS INTO EACH NOSTRIL EVERY DAY 12/30/18   Gregor Hams, MD  JUNEL 1/20 1-20 MG-MCG tablet TAKE 1 (ONE) TABLET DAILY CONTINUOUS ACTIVE PILLS 05/03/20   Samuel Bouche, NP  LORazepam (ATIVAN) 0.5 MG tablet Take 1 tablet (0.5 mg total) by mouth daily as needed for anxiety. 12/05/20   Merian Capron, MD  meclizine (ANTIVERT) 25 MG tablet Take 1 tablet  (25 mg total) by mouth 3 (three) times daily as needed for dizziness. 02/08/20   Samuel Bouche, NP  ondansetron (ZOFRAN-ODT) 4 MG disintegrating tablet Take 1 tablet (4 mg total) by mouth every 8 (eight) hours as needed for nausea or vomiting. 02/08/20   Samuel Bouche, NP  pantoprazole (PROTONIX) 40 MG tablet Take 1 tablet (40 mg total) by mouth daily. 02/08/20   Samuel Bouche, NP  Prenatal Vit-Fe Fumarate-FA (PRENATAL MULTIVITAMIN) TABS tablet Take 1 tablet by mouth daily at 12 noon.    [provider]    Family History Family History  Problem Relation Age of Onset   Hypertension Mother    Cancer Mother        multiple myeloma   Diabetes Mother    Hypertension Father    Stroke Father    Cancer Maternal Grandmother        CLL   Post-traumatic stress disorder Maternal Uncle    Crohn's disease Cousin        maternal grandfather sister daughter   Colon cancer Neg Hx    Esophageal cancer Neg Hx     Social History Social History   Tobacco Use   Smoking status: Never   Smokeless tobacco: Never  Vaping Use   Vaping Use: Never used  Substance Use Topics   Alcohol use: No    Alcohol/week: 0.0 standard drinks   Drug use: No     Allergies   Catfish [fish allergy], Fish-derived products, Pineapple, Amoxicillin, Citric acid, Monosodium glutamate, Tomato flavor  [flavoring agent], and Phenergan [promethazine hcl]   Review of Systems Review of Systems  See HPI Physical Exam Triage Vital Signs ED Triage Vitals  Enc Vitals Group     BP 12/14/20 1106 118/84     Pulse Rate 12/14/20 1106 98     Resp 12/14/20 1106 14     Temp 12/14/20 1106 99.4 F (37.4 C)     Temp Source 12/14/20 1106 Oral     SpO2 12/14/20 1106 100 %     Weight --      Height --      Head Circumference --      Peak Flow --      Pain Score 12/14/20 1110 5     Pain Loc --      Pain Edu? --      Excl. in [place]? --    No data found.  Updated Vital Signs BP 118/84 (BP Location: Left  Arm)   Pulse 98   Temp  99.4 F (37.4 C) (Oral)   Resp 14   SpO2 100%     Physical Exam Constitutional:      General: She is not in acute distress.    Appearance: She is well-developed.     Comments: Hoarse voice  HENT:     Head: Normocephalic and atraumatic.     Right Ear: Tympanic membrane and ear canal normal.     Left Ear: Tympanic membrane and ear canal normal.     Nose: Congestion and rhinorrhea present.     Mouth/Throat:     Mouth: Mucous membranes are moist.     Pharynx: Posterior oropharyngeal erythema present.  Eyes:     Conjunctiva/sclera: Conjunctivae normal.     Pupils: Pupils are equal, round, and reactive to light.  Cardiovascular:     Rate and Rhythm: Normal rate and regular rhythm.     Heart sounds: Normal heart sounds.  Pulmonary:     Effort: Pulmonary effort is normal. No respiratory distress.     Breath sounds: Wheezing present. No rales.     Comments: Few rhonchi.  Few scattered wheeze.  Majority anterior Abdominal:     General: There is no distension.     Palpations: Abdomen is soft.  Musculoskeletal:        General: Normal range of motion.     Cervical back: Normal range of motion and neck supple.  Lymphadenopathy:     Cervical: No cervical adenopathy.  Skin:    General: Skin is warm and dry.  Neurological:     Mental Status: She is alert.  Psychiatric:        Mood and Affect: Mood normal.        Behavior: Behavior normal.     UC Treatments / Results  Labs (all labs ordered are listed, but only abnormal results are displayed) Labs Reviewed - No data to display  EKG   Radiology No results found.  Procedures Procedures (including critical care time)  Medications Ordered in UC Medications - No data to display  Initial Impression / Assessment and Plan / UC Course  I have reviewed the triage vital signs and the nursing notes.  Pertinent labs & imaging results that were available during my care of the patient were reviewed by me and considered in my medical  decision making (see chart for details).     Given history of asthma and allergies, and worsening symptoms over a 1 week period of time we will give a trial of antibiotics and prednisone.  Push fluids.  See PCP if not improving by next week Final Clinical Impressions(s) / UC Diagnoses   Final diagnoses:  LRTI (lower respiratory tract infection)  History of asthma     Discharge Instructions      Continue to drink plenty of fluids Take the Z-Pak as prescribed Take the prednisone 2 times a day for 5 days May continue to use over-the-counter cough and cold medicine as needed Get plenty of rest Call or return for problems.  Here or PCP.   ED Prescriptions     Medication Sig Dispense Auth. Provider   azithromycin (ZITHROMAX) 250 MG tablet Take 1 tablet (250 mg total) by mouth daily. Take first 2 tablets together, then 1 every day until finished. 6 tablet Raylene Everts, MD   predniSONE (DELTASONE) 20 MG tablet Take 1 tablet (20 mg total) by mouth 2 (two) times daily with a meal. 10 tablet Meda Coffee,  Jennette Banker, MD      PDMP not reviewed this encounter.   Raylene Everts, MD 12/14/20 [phone]

## 2020-12-15 ENCOUNTER — Encounter: Payer: Self-pay | Admitting: Medical-Surgical

## 2021-01-27 ENCOUNTER — Other Ambulatory Visit (HOSPITAL_COMMUNITY): Payer: Self-pay | Admitting: Psychiatry

## 2021-01-31 ENCOUNTER — Other Ambulatory Visit: Payer: Self-pay | Admitting: Medical-Surgical

## 2021-02-08 ENCOUNTER — Encounter: Payer: Self-pay | Admitting: Medical-Surgical

## 2021-02-09 ENCOUNTER — Encounter: Payer: Self-pay | Admitting: Medical-Surgical

## 2021-02-09 ENCOUNTER — Other Ambulatory Visit: Payer: Self-pay | Admitting: Medical-Surgical

## 2021-02-09 ENCOUNTER — Other Ambulatory Visit (HOSPITAL_COMMUNITY)
Admission: RE | Admit: 2021-02-09 | Discharge: 2021-02-09 | Disposition: A | Discharge status: Home / Self Care | Payer: 59 | Source: Ambulatory Visit | Attending: Medical-Surgical | Admitting: Medical-Surgical

## 2021-02-09 ENCOUNTER — Other Ambulatory Visit: Payer: Self-pay

## 2021-02-09 ENCOUNTER — Ambulatory Visit (INDEPENDENT_AMBULATORY_CARE_PROVIDER_SITE_OTHER): Payer: 59 | Admitting: Medical-Surgical

## 2021-02-09 VITALS — BP 118/86 | HR 92 | Resp 20 | Ht 65.0 in | Wt 144.9 lb

## 2021-02-09 DIAGNOSIS — Z23 Encounter for immunization: Secondary | ICD-10-CM | POA: Diagnosis not present

## 2021-02-09 DIAGNOSIS — Z124 Encounter for screening for malignant neoplasm of cervix: Secondary | ICD-10-CM | POA: Diagnosis present

## 2021-02-09 DIAGNOSIS — G43709 Chronic migraine without aura, not intractable, without status migrainosus: Secondary | ICD-10-CM

## 2021-02-09 DIAGNOSIS — G4719 Other hypersomnia: Secondary | ICD-10-CM

## 2021-02-09 DIAGNOSIS — Z0000 Encounter for general adult medical examination without abnormal findings: Secondary | ICD-10-CM | POA: Diagnosis not present

## 2021-02-09 MED ORDER — NURTEC 75 MG PO TBDP
1.0000 | ORAL_TABLET | Freq: Every day | ORAL | 0 refills | Status: DC | PRN
Start: 2021-02-09 — End: 2021-04-25

## 2021-02-09 NOTE — Patient Instructions (Signed)
Preventive Care 21-39 Years Old, Female Preventive care refers to lifestyle choices and visits with your health care provider that can promote health and wellness. This includes: A yearly physical exam. This is also called an annual wellness visit. Regular dental and eye exams. Immunizations. Screening for certain conditions. Healthy lifestyle choices, such as: Eating a healthy diet. Getting regular exercise. Not using drugs or products that contain nicotine and tobacco. Limiting alcohol use. What can I expect for my preventive care visit? Physical exam Your health care provider may check your: Height and weight. These may be used to calculate your BMI (body mass index). BMI is a measurement that tells if you are at a healthy weight. Heart rate and blood pressure. Body temperature. Skin for abnormal spots. Counseling Your health care provider may ask you questions about your: Past medical problems. Family's medical history. Alcohol, tobacco, and drug use. Emotional well-being. Home life and relationship well-being. Sexual activity. Diet, exercise, and sleep habits. Work and work environment. Access to firearms. Method of birth control. Menstrual cycle. Pregnancy history. What immunizations do I need? Vaccines are usually given at various ages, according to a schedule. Your health care provider will recommend vaccines for you based on your age, medical history, and lifestyle or other factors, such as travel or where you work. What tests do I need? Blood tests Lipid and cholesterol levels. These may be checked every 5 years starting at age 20. Hepatitis C test. Hepatitis B test. Screening Diabetes screening. This is done by checking your blood sugar (glucose) after you have not eaten for a while (fasting). STD (sexually transmitted disease) testing, if you are at risk. BRCA-related cancer screening. This may be done if you have a family history of breast, ovarian, tubal, or  peritoneal cancers. Pelvic exam and Pap test. This may be done every 3 years starting at age 21. Starting at age 30, this may be done every 5 years if you have a Pap test in combination with an HPV test. Talk with your health care provider about your test results, treatment options, and if necessary, the need for more tests. Follow these instructions at home: Eating and drinking  Eat a healthy diet that includes fresh fruits and vegetables, whole grains, lean protein, and low-fat dairy products. Take vitamin and mineral supplements as recommended by your health care provider. Do not drink alcohol if: Your health care provider tells you not to drink. You are pregnant, may be pregnant, or are planning to become pregnant. If you drink alcohol: Limit how much you have to 0-1 drink a day. Be aware of how much alcohol is in your drink. In the [place] one drink equals one 12 oz bottle of beer (355 mL), one 5 oz glass of wine (148 mL), or one 1 oz glass of hard liquor (44 mL). Lifestyle Take daily care of your teeth and gums. Brush your teeth every morning and night with fluoride toothpaste. Floss one time each day. Stay active. Exercise for at least 30 minutes 5 or more days each week. Do not use any products that contain nicotine or tobacco, such as cigarettes, e-cigarettes, and chewing tobacco. If you need help quitting, ask your health care provider. Do not use drugs. If you are sexually active, practice safe sex. Use a condom or other form of protection to prevent STIs (sexually transmitted infections). If you do not wish to become pregnant, use a form of birth control. If you plan to become pregnant, see your health care provider   for a prepregnancy visit. Find healthy ways to cope with stress, such as: Meditation, yoga, or listening to music. Journaling. Talking to a trusted person. Spending time with friends and family. Safety Always wear your seat belt while driving or riding in a  vehicle. Do not drive: If you have been drinking alcohol. Do not ride with someone who has been drinking. When you are tired or distracted. While texting. Wear a helmet and other protective equipment during sports activities. If you have firearms in your house, make sure you follow all gun safety procedures. Seek help if you have been physically or sexually abused. What's next? Go to your health care provider once a year for an annual wellness visit. Ask your health care provider how often you should have your eyes and teeth checked. Stay up to date on all vaccines. This information is not intended to replace advice given to you by your health care provider. Make sure you discuss any questions you have with your health care provider. Document Revised: 07/14/2020 Document Reviewed: 01/15/2018 Elsevier Patient Education  2022 [employer]  

## 2021-02-09 NOTE — Progress Notes (Signed)
HPI: Kelsey [name] is a 40 y.o. 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  she presents to [facility] today, 02/09/21,  for chief complaint of: Annual physical exam  Dentist: UTD Eye exam: UTD, glasses Exercise: none intentional  Diet: no special restrictions Pap smear: doing today Mammogram: NA Colon cancer screening: NA COVID vaccine: Done  Concerns:  Migraines- on Emgality injections once monthly but still having breakthrough headaches. Tried several triptans in the past without relief. OTC not helpful.   Not sleeping well. Has excessive daytime sleepiness. Sleep onset fine but wakes several times nightly and has difficulty getting back to sleep at times. Wonders if she may have sleep apnea. Has a hard time getting out of bed in the mornings which seems to be a chronic thing. Unsure if she snores.   Taking Ativan for performance anxiety which is helping with IBS symptoms associated with her presentations for work. Medication leaves her loopy and drowsy the rest of the day. Doesn't want to be on the medication forever.   Past medical, surgical, social and family history reviewed:  Patient Active Problem List   Diagnosis Date Noted   Encounter for female birth control 06/01/2019   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   Arthralgia 06/09/2017   Abnormal laboratory test 09/07/2014   Asthma, mild persistent 03/08/2014   GERD (gastroesophageal reflux disease) 03/08/2014   Seasonal allergies 03/08/2014   Irritable bowel syndrome with diarrhea 03/08/2014   Anxiety and depression 03/08/2014   Chronic daily headache 12/31/2013   Vertigo  12/31/2013   Postconcussive syndrome 12/31/2013    Past Surgical History:  Procedure Laterality Date   CESAREAN SECTION N/A 07/08/2015   Procedure: CESAREAN SECTION;  Surgeon: Aloha Gell, MD;  Location: [facility];  Service: Obstetrics;  Laterality: N/A;   ESOPHAGOGASTRODUODENOSCOPY     [facility] 2009-2010   NO PAST SURGERIES      Social History   Tobacco Use   Smoking status: Never   Smokeless tobacco: Never  Substance Use Topics   Alcohol use: No    Alcohol/week: 0.0 standard drinks    Family History  Problem Relation Age of Onset   Hypertension Mother    Cancer Mother        multiple myeloma   Diabetes Mother    Hypertension Father    Stroke Father    Cancer Maternal Grandmother        CLL   Post-traumatic stress disorder Maternal Uncle    Crohn's disease Cousin        maternal grandfather sister daughter   Colon cancer Neg Hx    Esophageal cancer Neg Hx      Current medication list and allergy/intolerance information reviewed:    Current Outpatient Medications  Medication Sig Dispense Refill   albuterol (VENTOLIN HFA) 108 (90 Base) MCG/ACT inhaler INHALE 2 PUFFS INTO THE LUNGS EVERY 4 (FOUR) HOURS AS NEEDED FOR WHEEZING. 6.7 g 3   amitriptyline (ELAVIL) 25 MG tablet TAKE 1 TABLET BY MOUTH AT BEDTIME. **PATIENT NEEDS OFFICE VISIT FOR ADDITIONAL REFILLS** 90 tablet 0   BIOTIN PO Take by mouth.     cetirizine (ZYRTEC) 10 MG tablet Take 10 mg by mouth daily.     diclofenac sodium (VOLTAREN) 1 % GEL Apply  4 g topically 4 (four) times daily. To affected joint. 100 g 11   dicyclomine (BENTYL) 10 MG capsule Take 1-2 capsules (10-20 mg total) by mouth 2 (two) times daily as needed for spasms. **PATIENT NEEDS OFFICE VISIT FOR ADDITIONAL REFILLS** 120 capsule 0   fluticasone (FLONASE) 50 MCG/ACT nasal spray SPRAY 2 SPRAYS INTO EACH NOSTRIL EVERY DAY 16 mL 12   JUNEL 1/20 1-20 MG-MCG tablet TAKE 1 (ONE) TABLET DAILY CONTINUOUS ACTIVE PILLS 84 tablet 3   LORazepam  (ATIVAN) 0.5 MG tablet Take 1 tablet (0.5 mg total) by mouth daily as needed for anxiety. 30 tablet 0   meclizine (ANTIVERT) 25 MG tablet Take 1 tablet (25 mg total) by mouth 3 (three) times daily as needed for dizziness. 30 tablet 0   ondansetron (ZOFRAN-ODT) 4 MG disintegrating tablet Take 1 tablet (4 mg total) by mouth every 8 (eight) hours as needed for nausea or vomiting. 30 tablet 0   pantoprazole (PROTONIX) 40 MG tablet TAKE 1 TABLET BY MOUTH EVERY DAY 30 tablet 11   Prenatal Vit-Fe Fumarate-FA (PRENATAL MULTIVITAMIN) TABS tablet Take 1 tablet by mouth daily at 12 noon.     Rimegepant Sulfate (NURTEC) 75 MG TBDP Take 1 tablet by mouth daily as needed. 10 tablet 0   No current facility-administered medications for this visit.    Allergies  Allergen Reactions   Catfish [Fish Allergy] Shortness Of Breath   Citric Acid Hives    Per patient report Per patient report   Fish-Derived Products Shortness Of Breath   Pineapple Hives   Banana Hives   Flavoring Agent Other (See Comments)    Mouth sores Headache   Monosodium Glutamate Other (See Comments)    Headache Headache   Promethazine Hcl Other (See Comments)    Restless motion Restless motion   Tomato Other (See Comments)   Amoxicillin Other (See Comments)    Stomach pain      Review of Systems: Constitutional:  No  fever, no chills, No recent illness, No unintentional weight changes. No significant fatigue.  HEENT: +  headache, no vision change, no hearing change, No sore throat, No  sinus pressure Cardiac: No  chest pain, No  pressure, No palpitations, No  Orthopnea Respiratory:  No  shortness of breath. No  Cough Gastrointestinal: No  abdominal pain, No  nausea, No  vomiting,  No  blood in stool, No  diarrhea, No  constipation  Musculoskeletal: No new myalgia/arthralgia Skin: No  Rash, No other wounds/concerning lesions Genitourinary: No  incontinence, No  abnormal genital bleeding, No abnormal genital discharge Hem/Onc:  No  easy bruising/bleeding, No  abnormal lymph node Endocrine: No cold intolerance,  No heat intolerance. No polyuria/polydipsia/polyphagia  Neurologic: No  weakness, No  dizziness, No  slurred speech/focal weakness/facial droop Psychiatric: No  concerns with depression, No  concerns with anxiety, + sleep problems, No mood problems  Exam:  BP 118/86 (BP Location: Left Arm, Patient Position: Sitting, Cuff Size: Normal)   Pulse 92   Resp 20   Ht '5\' 5"'  (1.651 m)   Wt 144 lb 14.4 oz (65.7 kg)   SpO2 100%   BMI 24.11 kg/m  Constitutional: VS see above. General Appearance: alert, well-developed, well-nourished, NAD Eyes: Normal lids and conjunctive, non-icteric sclera Ears, Nose, Mouth, Throat: MMM, Normal external inspection ears/nares/mouth/lips/gums. TM normal bilaterally.   Neck: No masses, trachea midline. No thyroid enlargement. No tenderness/mass appreciated. No lymphadenopathy Respiratory: Normal respiratory effort. no wheeze, no rhonchi, no rales Cardiovascular: S1/S2 normal,  no murmur, no rub/gallop auscultated. RRR. No lower extremity edema. Pedal pulse II/IV bilaterally PT. No carotid bruit or JVD. No abdominal aortic bruit. Gastrointestinal: Nontender, no masses. No hepatomegaly, no splenomegaly. No hernia appreciated. Bowel sounds normal. Rectal exam deferred.  Musculoskeletal: Gait normal. No clubbing/cyanosis of digits.  Neurological: Normal balance/coordination. No tremor. No cranial nerve deficit on limited exam. Motor and sensation intact and symmetric. Cerebellar reflexes intact.  Skin: warm, dry, intact. No rash/ulcer. No concerning nevi or subq nodules on limited exam.   Psychiatric: Normal judgment/insight. Normal mood and affect. Oriented x3.  Pelvic exam: normal external genitalia, vulva, vagina, cervix, uterus and adnexa, PAP: Pap smear done today, HPV test, exam chaperoned by Lynnea Ferrier, MA   ASSESSMENT/PLAN:   1. Encounter for Papanicolaou smear for  cervical cancer screening Pap with HPV cotesting today.  - Cytology - PAP  2. Annual physical exam Checking labs. Discussed preventative care. Recommend regular exercise.  - CBC with Differential/Platelet - Lipid panel - COMPLETE METABOLIC PANEL WITH GFR  3. Excessive daytime sleepiness Sleep study ordered. - Home sleep test; Future - Home sleep test  4. Chronic migraine without aura without status migrainosus, not intractable Trialing Nurtec 75 mg daily as needed.  Orders Placed This Encounter  Procedures   Flu Vaccine QUAD 73moIM (Fluarix, Fluzone & Alfiuria Quad PF)   CBC with Differential/Platelet   Lipid panel   COMPLETE METABOLIC PANEL WITH GFR   Home sleep test    Meds ordered this encounter  Medications   Rimegepant Sulfate (NURTEC) 75 MG TBDP    Sig: Take 1 tablet by mouth daily as needed.    Dispense:  10 tablet    Refill:  0    Has tried Maxalt, Sumatriptan, Amitriptyline. On emgality but still has breakthrough headaches.    Order Specific Question:   Supervising Provider    Answer:   MEstell Harpin   Patient Instructions  Preventive Care [id number] Old, Female Preventive care refers to lifestyle choices and visits with your health care provider that can promote health and wellness. This includes: A yearly physical exam. This is also called an annual wellness visit. Regular dental and eye exams. Immunizations. Screening for certain conditions. Healthy lifestyle choices, such as: Eating a healthy diet. Getting regular exercise. Not using drugs or products that contain nicotine and tobacco. Limiting alcohol use. What can I expect for my preventive care visit? Physical exam Your health care provider may check your: Height and weight. These may be used to calculate your BMI (body mass index). BMI is a measurement that tells if you are at a healthy weight. Heart rate and blood pressure. Body temperature. Skin for abnormal spots. Counseling Your  health care provider may ask you questions about your: Past medical problems. Family's medical history. Alcohol, tobacco, and drug use. Emotional well-being. Home life and relationship well-being. Sexual activity. Diet, exercise, and sleep habits. Work and work eStatistician Access to firearms. Method of birth control. Menstrual cycle. Pregnancy history. What immunizations do I need? Vaccines are usually given at various ages, according to a schedule. Your health care provider will recommend vaccines for you based on your age, medical history, and lifestyle or other factors, such as travel or where you work. What tests do I need? Blood tests Lipid and cholesterol levels. These may be checked every 5 years starting at age 38 Hepatitis C test. Hepatitis B test. Screening Diabetes screening. This is done by checking your blood sugar (glucose) after you have  not eaten for a while (fasting). STD (sexually transmitted disease) testing, if you are at risk. BRCA-related cancer screening. This may be done if you have a family history of breast, ovarian, tubal, or peritoneal cancers. Pelvic exam and Pap test. This may be done every 3 years starting at age 33. Starting at age 76, this may be done every 5 years if you have a Pap test in combination with an HPV test. Talk with your health care provider about your test results, treatment options, and if necessary, the need for more tests. Follow these instructions at home: Eating and drinking  Eat a healthy diet that includes fresh fruits and vegetables, whole grains, lean protein, and low-fat dairy products. Take vitamin and mineral supplements as recommended by your health care provider. Do not drink alcohol if: Your health care provider tells you not to drink. You are pregnant, may be pregnant, or are planning to become pregnant. If you drink alcohol: Limit how much you have to 0-1 drink a day. Be aware of how much alcohol is in your drink.  In the [place] one drink equals one 12 oz bottle of beer (355 mL), one 5 oz glass of wine (148 mL), or one 1 oz glass of hard liquor (44 mL). Lifestyle Take daily care of your teeth and gums. Brush your teeth every morning and night with fluoride toothpaste. Floss one time each day. Stay active. Exercise for at least 30 minutes 5 or more days each week. Do not use any products that contain nicotine or tobacco, such as cigarettes, e-cigarettes, and chewing tobacco. If you need help quitting, ask your health care provider. Do not use drugs. If you are sexually active, practice safe sex. Use a condom or other form of protection to prevent STIs (sexually transmitted infections). If you do not wish to become pregnant, use a form of birth control. If you plan to become pregnant, see your health care provider for a prepregnancy visit. Find healthy ways to cope with stress, such as: Meditation, yoga, or listening to music. Journaling. Talking to a trusted person. Spending time with friends and family. Safety Always wear your seat belt while driving or riding in a vehicle. Do not drive: If you have been drinking alcohol. Do not ride with someone who has been drinking. When you are tired or distracted. While texting. Wear a helmet and other protective equipment during sports activities. If you have firearms in your house, make sure you follow all gun safety procedures. Seek help if you have been physically or sexually abused. What's next? Go to your health care provider once a year for an annual wellness visit. Ask your health care provider how often you should have your eyes and teeth checked. Stay up to date on all vaccines. This information is not intended to replace advice given to you by your health care provider. Make sure you discuss any questions you have with your health care provider. Document Revised: 07/14/2020 Document Reviewed: 01/15/2018 Elsevier Patient Education  [place].  Follow-up plan: Return in about 6 months (around 08/09/2021) for chronic disease follow up.  Clearnce Sorrel, DNP, APRN, FNP-BC [facility]

## 2021-02-09 NOTE — Telephone Encounter (Signed)
PA submitted 9/23.

## 2021-02-10 LAB — COMPLETE METABOLIC PANEL WITH GFR
AG Ratio: 1.7 (calc) (ref 1.0–2.5)
ALT: 11 U/L (ref 6–29)
AST: 15 U/L (ref 10–30)
Albumin: 4.3 g/dL (ref 3.6–5.1)
Alkaline phosphatase (APISO): 35 U/L (ref 31–125)
BUN/Creatinine Ratio: 13 (calc) (ref 6–22)
BUN: 13 mg/dL (ref 7–25)
CO2: 25 mmol/L (ref 20–32)
Calcium: 9.6 mg/dL (ref 8.6–10.2)
Chloride: 105 mmol/L (ref 98–110)
Creat: 0.99 mg/dL — ABNORMAL HIGH (ref 0.50–0.97)
Globulin: 2.6 g/dL (calc) (ref 1.9–3.7)
Glucose, Bld: 68 mg/dL (ref 65–99)
Potassium: 4.4 mmol/L (ref 3.5–5.3)
Sodium: 139 mmol/L (ref 135–146)
Total Bilirubin: 0.6 mg/dL (ref 0.2–1.2)
Total Protein: 6.9 g/dL (ref 6.1–8.1)
eGFR: 75 mL/min/{1.73_m2} (ref >=60)

## 2021-02-10 LAB — CBC WITH DIFFERENTIAL/PLATELET
Absolute Monocytes: 228 cells/uL (ref 200–950)
Basophils Absolute: 38 cells/uL (ref 0–200)
Basophils Relative: 1 %
Eosinophils Absolute: 87 cells/uL (ref 15–500)
Eosinophils Relative: 2.3 %
HCT: 43.8 % (ref 35.0–45.0)
Hemoglobin: 14.7 g/dL (ref 11.7–15.5)
Lymphs Abs: 1497 cells/uL (ref 850–3900)
MCH: 29.5 pg (ref 27.0–33.0)
MCHC: 33.6 g/dL (ref 32.0–36.0)
MCV: 87.8 fL (ref 80.0–100.0)
MPV: 10.3 fL (ref 7.5–12.5)
Monocytes Relative: 6 %
Neutro Abs: 1949 cells/uL (ref 1500–7800)
Neutrophils Relative %: 51.3 %
Platelets: 268 10*3/uL (ref 140–400)
RBC: 4.99 10*6/uL (ref 3.80–5.10)
RDW: 12.6 % (ref 11.0–15.0)
Total Lymphocyte: 39.4 %
WBC: 3.8 10*3/uL (ref 3.8–10.8)

## 2021-02-10 LAB — LIPID PANEL
Cholesterol: 166 mg/dL (ref <200)
HDL: 47 mg/dL — ABNORMAL LOW (ref >=50)
LDL Cholesterol (Calc): 103 mg/dL (calc) — ABNORMAL HIGH
Non-HDL Cholesterol (Calc): 119 mg/dL (calc) (ref <130)
Total CHOL/HDL Ratio: 3.5 (calc) (ref <5.0)
Triglycerides: 74 mg/dL (ref <150)

## 2021-02-12 LAB — CYTOLOGY - PAP
Adequacy: ABSENT
Comment: NEGATIVE
Diagnosis: NEGATIVE
High risk HPV: NEGATIVE

## 2021-02-13 ENCOUNTER — Other Ambulatory Visit: Payer: Self-pay

## 2021-02-13 DIAGNOSIS — G4719 Other hypersomnia: Secondary | ICD-10-CM

## 2021-02-13 NOTE — Progress Notes (Signed)
Transformational zone was absent. No abnormal cells detected. Negative for HPV. Due to transformational zone being absent I will have patients come back in 1 year for repeat pap.

## 2021-02-22 ENCOUNTER — Encounter: Payer: Self-pay | Admitting: Medical-Surgical

## 2021-02-22 ENCOUNTER — Encounter (HOSPITAL_COMMUNITY): Payer: Self-pay | Admitting: Psychiatry

## 2021-02-22 ENCOUNTER — Ambulatory Visit (INDEPENDENT_AMBULATORY_CARE_PROVIDER_SITE_OTHER): Payer: 59 | Admitting: Psychiatry

## 2021-02-22 VITALS — BP 110/78 | Temp 98.4°F | Ht 65.0 in | Wt 145.0 lb

## 2021-02-22 DIAGNOSIS — F419 Anxiety disorder, unspecified: Secondary | ICD-10-CM

## 2021-02-22 DIAGNOSIS — F410 Panic disorder [episodic paroxysmal anxiety] without agoraphobia: Secondary | ICD-10-CM | POA: Diagnosis not present

## 2021-02-22 NOTE — Progress Notes (Signed)
[facility] Follow up visit  Patient Identification: Kelsey [name] MRN:  [id number] Date of Evaluation:  02/22/2021 Referral Source: GI  Chief Complaint:  establish care, anxiety Visit Diagnosis:    ICD-10-CM   1. Anxiety disorder, unspecified type  F41.9     2. Panic attacks  F41.0         History of Present Illness: Patient is a 40 years old currently married African-American female referred by GI doctor for establishment of care for possible anxiety.  Patient has been suffering from irritable bowel syndrome She also has a head injury in the past leading to migraines and now is currently on disability since 2015  She does volunteer work,gets anxious around seminar times. Ativan does help but she wants to consider to stop it and deal with anxiety.  She deals on anxiety but overall she only uses Ativan once a week   She is in therapy with her husband , has tried other medications or SSRIs that has not worked   Aggravating factor: when in public to give webinar, past head injury,relationship stress Modifying factor: Family Patient does not endorse using drugs or alcohol No past psychiatric admission suicidal attempt  Duration more so last few years   Past Psychiatric History: anxiety  Consequences of Substance Abuse: NA  Past Medical History:  Past Medical History:  Diagnosis Date   Anxiety and depression    Asthma    Brain injury 2014   Depression    Endometriosis    Frequent headaches    GERD (gastroesophageal reflux disease)    Hx of varicella    IBS (irritable bowel syndrome)    Migraines    Panic attacks    Postpartum care following cesarean delivery (2/18) 07/08/2015   Seizure ([place])    Vertigo 12/31/2013    Past Surgical History:  Procedure Laterality Date   CESAREAN SECTION N/A 07/08/2015   Procedure: CESAREAN SECTION;  Surgeon: Aloha Gell, MD;  Location: [facility];  Service: Obstetrics;  Laterality: N/A;   ESOPHAGOGASTRODUODENOSCOPY     [facility]  2009-2010   NO PAST SURGERIES      Family Psychiatric History: Mom: OCD possibel  Family History:  Family History  Problem Relation Age of Onset   Hypertension Mother    Cancer Mother        multiple myeloma   Diabetes Mother    Hypertension Father    Stroke Father    Cancer Maternal Grandmother        CLL   Post-traumatic stress disorder Maternal Uncle    Crohn's disease Cousin        maternal grandfather sister daughter   Colon cancer Neg Hx    Esophageal cancer Neg Hx     Social History:   Social History   Socioeconomic History   Marital status: Married    Spouse name: Not on file   Number of children: 0   Years of education: Not on file   Highest education level: Not on file  Occupational History   Not on file  Tobacco Use   Smoking status: Never   Smokeless tobacco: Never  Vaping Use   Vaping Use: Never used  Substance and Sexual Activity   Alcohol use: No    Alcohol/week: 0.0 standard drinks   Drug use: No   Sexual activity: Yes    Partners: Male    Birth control/protection: Pill  Other Topics Concern   Not on file  Social History Narrative   Work  or School: Restaurant manager, 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      Home Situation: lives with husband      Spiritual Beliefs: Christian      Lifestyle: doing 20 minutes of exercise and a little weight lifting daily; diet is healthy            Social Determinants of Radio broadcast assistant Strain: Not on file  Food Insecurity: Not on file  Transportation Needs: Not on file  Physical Activity: Not on file  Stress: Not on file  Social Connections: Not on file     Allergies:   Allergies  Allergen Reactions   Catfish [Fish Allergy] Shortness Of Breath   Citric Acid Hives    Per patient report Per patient report   Fish-Derived Products Shortness Of Breath   Pineapple Hives   Banana Hives   Flavoring Agent Other (See Comments)    Mouth sores Headache   Monosodium Glutamate Other (See Comments)     Headache Headache   Promethazine Hcl Other (See Comments)    Restless motion Restless motion   Tomato Other (See Comments)   Amoxicillin Other (See Comments)    Stomach pain    Metabolic Disorder Labs: Lab Results  Component Value Date   HGBA1C 5.2 03/08/2014   No results found for: PROLACTIN Lab Results  Component Value Date   CHOL 166 02/09/2021   TRIG 74 02/09/2021   HDL 47 (L) 02/09/2021   CHOLHDL 3.5 02/09/2021   VLDL 11.4 03/08/2014   LDLCALC 103 (H) 02/09/2021   LDLCALC 85 02/11/2020   Lab Results  Component Value Date   TSH 1.50 02/11/2020    Therapeutic Level Labs: No results found for: LITHIUM No results found for: CBMZ No results found for: VALPROATE  Current Medications: Current Outpatient Medications  Medication Sig Dispense Refill   albuterol (VENTOLIN HFA) 108 (90 Base) MCG/ACT inhaler INHALE 2 PUFFS INTO THE LUNGS EVERY 4 (FOUR) HOURS AS NEEDED FOR WHEEZING. 6.7 g 3   amitriptyline (ELAVIL) 25 MG tablet TAKE 1 TABLET BY MOUTH AT BEDTIME. **PATIENT NEEDS OFFICE VISIT FOR ADDITIONAL REFILLS** 90 tablet 0   BIOTIN PO Take by mouth.     cetirizine (ZYRTEC) 10 MG tablet Take 10 mg by mouth daily.     diclofenac sodium (VOLTAREN) 1 % GEL Apply 4 g topically 4 (four) times daily. To affected joint. 100 g 11   dicyclomine (BENTYL) 10 MG capsule Take 1-2 capsules (10-20 mg total) by mouth 2 (two) times daily as needed for spasms. **PATIENT NEEDS OFFICE VISIT FOR ADDITIONAL REFILLS** 120 capsule 0   fluticasone (FLONASE) 50 MCG/ACT nasal spray SPRAY 2 SPRAYS INTO EACH NOSTRIL EVERY DAY 16 mL 12   JUNEL 1/20 1-20 MG-MCG tablet TAKE 1 (ONE) TABLET DAILY CONTINUOUS ACTIVE PILLS 84 tablet 3   LORazepam (ATIVAN) 0.5 MG tablet Take 1 tablet (0.5 mg total) by mouth daily as needed for anxiety. 30 tablet 0   meclizine (ANTIVERT) 25 MG tablet Take 1 tablet (25 mg total) by mouth 3 (three) times daily as needed for dizziness. 30 tablet 0   ondansetron (ZOFRAN-ODT) 4 MG  disintegrating tablet Take 1 tablet (4 mg total) by mouth every 8 (eight) hours as needed for nausea or vomiting. 30 tablet 0   pantoprazole (PROTONIX) 40 MG tablet TAKE 1 TABLET BY MOUTH EVERY DAY 30 tablet 11   Prenatal Vit-Fe Fumarate-FA (PRENATAL MULTIVITAMIN) TABS tablet Take 1 tablet by mouth daily at 12 noon.     Rimegepant  Sulfate (NURTEC) 75 MG TBDP Take 1 tablet by mouth daily as needed. 10 tablet 0   No current facility-administered medications for this visit.     Psychiatric Specialty Exam: Review of Systems  Psychiatric/Behavioral:  Negative for hallucinations and self-injury.    Blood pressure 110/78, temperature 98.4 F (36.9 C), height '5\' 5"'  (1.651 m), weight 145 lb (65.8 kg), currently breastfeeding.Body mass index is 24.13 kg/m.  General Appearance: casual  Eye Contact:  fair  Speech:  Normal Rate  Volume:  Normal  Mood:  Euthymic  Affect:  Full Range  Thought Process:  Goal Directed  Orientation:  Full (Time, Place, and Person)  Thought Content:  Rumination  Suicidal Thoughts:  No  Homicidal Thoughts:  No  Memory:  Immediate;   Fair Recent;   Fair  Judgement:  Fair  Insight:  Fair  Psychomotor Activity:  Normal  Concentration:  Concentration: Good and Attention Span: Good  Recall:  Good  Fund of Knowledge:Good  Language: Good  Akathisia:  No  Handed:    AIMS (if indicated):  not done  Assets:  Communication Skills Desire for Improvement Financial Resources/Insurance Housing  ADL's:  Intact  Cognition: WNL  Sleep:  Fair   Screenings: GAD-7    [place] Office Visit from 11/19/2017 in [facility]  Total GAD-7 Score 3      PHQ2-9    [place] Visit from 02/09/2021 in [place] Video Visit from 08/01/2020 in [place] Office Visit from 02/08/2020 in [place] Visit from 11/19/2017  in [place] Visit from 08/26/2016 in [facility]  PHQ-2 Total Score 0 1 0 1 2  PHQ-9 Total Score 2 -- -- 6 5      [place] Visit from 02/22/2021 in [place] Office Visit from 12/05/2020 in [place] Office Visit from 10/10/2020 in [place] No Risk No Risk No Risk       Assessment and Plan: as follows Prior documentation reviewed Panic attacks; sporadic Ativan does help she can cut down the Ativan to half a tablet of 0.5 mg on Tuesdays when she has to give a webinar she agreed to that   anxiety unspecified: Improved continue work in therapy she is also on Elavil that helps sleep She is also scheduled for sleep study Follow-up in 2 to 3 months or earlier if needed she does have Ativan refill as of now Time spent in office face to face 25mn NMerian Capron MD 10/6/20229:25 AM

## 2021-02-26 ENCOUNTER — Encounter: Payer: Self-pay | Admitting: Medical-Surgical

## 2021-04-02 ENCOUNTER — Other Ambulatory Visit: Payer: Self-pay | Admitting: Medical-Surgical

## 2021-04-02 DIAGNOSIS — Z30019 Encounter for initial prescription of contraceptives, unspecified: Secondary | ICD-10-CM

## 2021-04-25 ENCOUNTER — Other Ambulatory Visit: Payer: Self-pay

## 2021-04-25 ENCOUNTER — Ambulatory Visit (INDEPENDENT_AMBULATORY_CARE_PROVIDER_SITE_OTHER): Payer: 59 | Admitting: Medical-Surgical

## 2021-04-25 ENCOUNTER — Encounter: Payer: Self-pay | Admitting: Medical-Surgical

## 2021-04-25 VITALS — BP 127/85 | HR 86 | Resp 20 | Ht 65.0 in | Wt 145.9 lb

## 2021-04-25 DIAGNOSIS — I10 Essential (primary) hypertension: Secondary | ICD-10-CM | POA: Diagnosis not present

## 2021-04-25 DIAGNOSIS — G4719 Other hypersomnia: Secondary | ICD-10-CM | POA: Diagnosis not present

## 2021-04-25 DIAGNOSIS — G4700 Insomnia, unspecified: Secondary | ICD-10-CM | POA: Diagnosis not present

## 2021-04-25 MED ORDER — PROPRANOLOL HCL 20 MG PO TABS: 20.0000 mg | ORAL_TABLET | Freq: Three times a day (TID) | ORAL | 0 refills | Status: DC | PRN

## 2021-04-25 MED ORDER — HYDROXYZINE HCL 25 MG PO TABS: 25.0000 mg | ORAL_TABLET | Freq: Every evening | ORAL | 0 refills | Status: DC | PRN

## 2021-04-25 NOTE — Progress Notes (Signed)
  HPI with pertinent ROS:   CC: Insomnia, BP check  HPI: Pleasant 40 year old female presenting today for the following:  Insomnia-continues to have difficulty sleeping.  She does take amitriptyline 25 mg nightly and notes that if she does not take it, she is unable to sleep at all.  She has difficulty falling asleep as well as staying asleep.  She notes that she sleeps very light and her husband gets up to go to the bathroom several times nightly.  Each time he gets up, she is awakened and has difficulty getting back to sleep.  Blood pressure concerns-has been checking her blood pressure regularly at home using an automated blood pressure cuff that measures on the arm.  She is using an app and tracking her blood pressures several times daily.  She has noticed that when she is stressed, her blood pressure does go up with the systolics in the 150s and the diastolics in the 90s.  Of course, she gets anxious about having her blood pressure that high and has difficulty getting it to come back down.  Inquiring about the status of her sleep study order.  I reviewed the past medical history, family history, social history, surgical history, and allergies today and no changes were needed.  Please see the problem list section below in epic for further details.   Physical exam:   General: Well Developed, well nourished, and in no acute distress.  Neuro: Alert and oriented x3.  HEENT: Normocephalic, atraumatic.  Skin: Warm and dry. Cardiac: Regular rate and rhythm, no murmurs rubs or gallops, no lower extremity edema.  Respiratory: Clear to auscultation bilaterally. Not using accessory muscles, speaking in full sentences.  Impression and Recommendations:    1. Hypertension, unspecified type Discussed the elevation of blood pressure and reaction to anxiety and stress.  This is not an unusual occurrence however if she is significantly worried about her blood pressure being that high and feels that it  is provoked by stress or anxiety, she could benefit from the use of as needed propranolol.  Sending in propranolol 20 mg instant release 3 times daily as needed.  Advised her to check her blood pressure after sitting for at least 10 minutes quietly with both feet flat on the floor.  If she rechecks it and it still high after this, okay to use propranolol on an as-needed basis.  2. Insomnia, unspecified type Adding hydroxyzine 12.5-25 mg nightly.  Continue amitriptyline as prescribed.  3. Excessive daytime sleepiness Sleep study order printed and taken to our referral coordinator for follow-up.  Return in about 3 months (around 07/24/2021) for general follow up. ___________________________________________ Thayer Ohm, DNP, APRN, FNP-BC Primary Care and Sports Medicine [facility]

## 2021-04-27 ENCOUNTER — Other Ambulatory Visit (HOSPITAL_COMMUNITY): Payer: Self-pay | Admitting: Psychiatry

## 2021-05-10 ENCOUNTER — Ambulatory Visit (HOSPITAL_COMMUNITY): Payer: 59 | Admitting: Psychiatry

## 2021-05-14 ENCOUNTER — Other Ambulatory Visit: Payer: Self-pay | Admitting: Medical-Surgical

## 2021-05-22 ENCOUNTER — Other Ambulatory Visit: Payer: Self-pay | Admitting: Medical-Surgical

## 2021-05-22 ENCOUNTER — Other Ambulatory Visit (HOSPITAL_COMMUNITY): Payer: Self-pay | Admitting: Psychiatry

## 2021-05-23 ENCOUNTER — Encounter: Payer: Self-pay | Admitting: Medical-Surgical

## 2021-05-28 ENCOUNTER — Encounter (HOSPITAL_COMMUNITY): Payer: Self-pay | Admitting: Psychiatry

## 2021-05-28 ENCOUNTER — Telehealth (INDEPENDENT_AMBULATORY_CARE_PROVIDER_SITE_OTHER): Payer: 59 | Admitting: Psychiatry

## 2021-05-28 DIAGNOSIS — F410 Panic disorder [episodic paroxysmal anxiety] without agoraphobia: Secondary | ICD-10-CM | POA: Diagnosis not present

## 2021-05-28 DIAGNOSIS — F419 Anxiety disorder, unspecified: Secondary | ICD-10-CM

## 2021-05-28 DIAGNOSIS — F5102 Adjustment insomnia: Secondary | ICD-10-CM | POA: Diagnosis not present

## 2021-05-28 MED ORDER — LORAZEPAM 0.5 MG PO TABS: 0.5000 mg | ORAL_TABLET | Freq: Every day | ORAL | 0 refills | Status: DC | PRN

## 2021-05-28 NOTE — Progress Notes (Signed)
[place] Follow up visit  Patient Identification: Kelsey [name] MRN:  [id number] Date of Evaluation:  05/28/2021 Referral Source: GI  Chief Complaint:  follow up sleep, anxiety Visit Diagnosis:    ICD-10-CM   1. Anxiety disorder, unspecified type  F41.9     2. Panic attacks  F41.0     3. Adjustment insomnia  F51.02      Virtual Visit via Video Note  I connected with Kelsey [name] on 05/28/21 at 11:30 AM EST by a video enabled telemedicine application and verified that I am speaking with the correct person using two identifiers.  Location: Patient: home Provider: home office   I discussed the limitations of evaluation and management by telemedicine and the availability of in person appointments. The patient expressed understanding and agreed to proceed.    I discussed the assessment and treatment plan with the patient. The patient was provided an opportunity to ask questions and all were answered. The patient agreed with the plan and demonstrated an understanding of the instructions.   The patient was advised to call back or seek an in-person evaluation if the symptoms worsen or if the condition fails to improve as anticipated.  I provided 19 minutes of non-face-to-face time during this encounter.    History of Present Illness: Patient is a 41 years old currently married African-American female referred by GI doctor for establishment of care for possible anxiety.  Patient has been suffering from irritable bowel syndrome She also has a head injury in the past leading to migraines and now is currently on disability since 2015  She does volunteer work,gets anxious around seminar times. She has considered to stop ativan but has been using 2 -3 times per week , it hellps Has had Bp fluctuations in past related to anxiety and inderal has helped Took vistaril for sleep but didn't work Now has appointment for home study sleep study next week Overall does ok with ativan when uses  it   She is in therapy with her husband , has tried other medications or SSRIs that has not worked   Aggravating factor: webinar presentations past head injury,relationship stress Modifying factor: family Patient does not endorse using drugs or alcohol No past psychiatric admission suicidal attempt  Duration more so last few years   Past Psychiatric History: anxiety  Consequences of Substance Abuse: NA  Past Medical History:  Past Medical History:  Diagnosis Date   Anxiety and depression    Asthma    Brain injury 2014   Depression    Endometriosis    Frequent headaches    GERD (gastroesophageal reflux disease)    Hx of varicella    IBS (irritable bowel syndrome)    Migraines    Panic attacks    Postpartum care following cesarean delivery (2/18) 07/08/2015   Seizure ([place])    Vertigo 12/31/2013    Past Surgical History:  Procedure Laterality Date   CESAREAN SECTION N/A 07/08/2015   Procedure: CESAREAN SECTION;  Surgeon: Aloha Gell, MD;  Location: [facility];  Service: Obstetrics;  Laterality: N/A;   ESOPHAGOGASTRODUODENOSCOPY     [facility] 2009-2010   NO PAST SURGERIES      Family Psychiatric History: Mom: OCD possibel  Family History:  Family History  Problem Relation Age of Onset   Hypertension Mother    Cancer Mother        multiple myeloma   Diabetes Mother    Hypertension Father    Stroke Father    Cancer  Maternal Grandmother        CLL   Post-traumatic stress disorder Maternal Uncle    Crohn's disease Cousin        maternal grandfather sister daughter   Colon cancer Neg Hx    Esophageal cancer Neg Hx     Social History:   Social History   Socioeconomic History   Marital status: Married    Spouse name: Not on file   Number of children: 0   Years of education: Not on file   Highest education level: Not on file  Occupational History   Not on file  Tobacco Use   Smoking status: Never   Smokeless tobacco: Never  Vaping Use    Vaping Use: Never used  Substance and Sexual Activity   Alcohol use: No    Alcohol/week: 0.0 standard drinks   Drug use: No   Sexual activity: Yes    Partners: Male    Birth control/protection: Pill  Other Topics Concern   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      Home Situation: lives with husband      Spiritual Beliefs: Christian      Lifestyle: doing 20 minutes of exercise and a little weight lifting daily; diet is healthy            Social Determinants of Radio broadcast assistant Strain: Not on file  Food Insecurity: Not on file  Transportation Needs: Not on file  Physical Activity: Not on file  Stress: Not on file  Social Connections: Not on file     Allergies:   Allergies  Allergen Reactions   Catfish [Fish Allergy] Shortness Of Breath   Citric Acid Hives    Per patient report Per patient report   Fish-Derived Products Shortness Of Breath   Pineapple Hives   Banana Hives   Flavoring Agent Other (See Comments)    Mouth sores Headache   Monosodium Glutamate Other (See Comments)    Headache Headache   Promethazine Hcl Other (See Comments)    Restless motion Restless motion   Tomato Other (See Comments)   Amoxicillin Other (See Comments)    Stomach pain    Metabolic Disorder Labs: Lab Results  Component Value Date   HGBA1C 5.2 03/08/2014   No results found for: PROLACTIN Lab Results  Component Value Date   CHOL 166 02/09/2021   TRIG 74 02/09/2021   HDL 47 (L) 02/09/2021   CHOLHDL 3.5 02/09/2021   VLDL 11.4 03/08/2014   LDLCALC 103 (H) 02/09/2021   LDLCALC 85 02/11/2020   Lab Results  Component Value Date   TSH 1.50 02/11/2020    Therapeutic Level Labs: No results found for: LITHIUM No results found for: CBMZ No results found for: VALPROATE  Current Medications: Current Outpatient Medications  Medication Sig Dispense Refill   albuterol (VENTOLIN HFA) 108 (90 Base) MCG/ACT  inhaler INHALE 2 PUFFS INTO THE LUNGS EVERY 4 (FOUR) HOURS AS NEEDED FOR WHEEZING. 6.7 g 3   amitriptyline (ELAVIL) 25 MG tablet TAKE 1 TABLET BY MOUTH EVERYDAY AT BEDTIME 90 tablet 0   BIOTIN PO Take by mouth.     cetirizine (ZYRTEC) 10 MG tablet Take 10 mg by mouth daily.     diclofenac sodium (VOLTAREN) 1 % GEL Apply 4 g topically 4 (four) times daily. To affected joint. 100 g 11   dicyclomine (BENTYL) 10 MG capsule Take 1-2 capsules (10-20 mg total) by  mouth 2 (two) times daily as needed for spasms. **PATIENT NEEDS OFFICE VISIT FOR ADDITIONAL REFILLS** 120 capsule 0   fluticasone (FLONASE) 50 MCG/ACT nasal spray SPRAY 2 SPRAYS INTO EACH NOSTRIL EVERY DAY 16 mL 12   hydrOXYzine (ATARAX) 25 MG tablet TAKE 1 TABLET (25 MG TOTAL) BY MOUTH AT BEDTIME AS NEEDED FOR ANXIETY (AND SLEEP). 90 tablet 1   JUNEL 1/20 1-20 MG-MCG tablet TAKE 1 (ONE) TABLET DAILY CONTINUOUS ACTIVE PILLS 84 tablet 3   LORazepam (ATIVAN) 0.5 MG tablet Take 1 tablet (0.5 mg total) by mouth daily as needed for anxiety. 30 tablet 0   meclizine (ANTIVERT) 25 MG tablet Take 1 tablet (25 mg total) by mouth 3 (three) times daily as needed for dizziness. 30 tablet 0   ondansetron (ZOFRAN-ODT) 4 MG disintegrating tablet Take 1 tablet (4 mg total) by mouth every 8 (eight) hours as needed for nausea or vomiting. 30 tablet 0   pantoprazole (PROTONIX) 40 MG tablet TAKE 1 TABLET BY MOUTH EVERY DAY 30 tablet 11   Prenatal Vit-Fe Fumarate-FA (PRENATAL MULTIVITAMIN) TABS tablet Take 1 tablet by mouth daily at 12 noon.     propranolol (INDERAL) 20 MG tablet TAKE 1 TABLET 3 TIMES A DAY AS NEEDED ANXIETY/STRESS WITH BLOOD PRESSURE ELEVATION 60 tablet 0   No current facility-administered medications for this visit.     Psychiatric Specialty Exam: Review of Systems  Cardiovascular:  Negative for chest pain.  Psychiatric/Behavioral:  Positive for sleep disturbance. Negative for hallucinations and self-injury.    currently breastfeeding.There is  no height or weight on file to calculate BMI.  General Appearance: casual  Eye Contact:  fair  Speech:  Normal Rate  Volume:  Normal  Mood: fair  Affect:  Full Range  Thought Process:  Goal Directed  Orientation:  Full (Time, Place, and Person)  Thought Content:  Rumination  Suicidal Thoughts:  No  Homicidal Thoughts:  No  Memory:  Immediate;   Fair Recent;   Fair  Judgement:  Fair  Insight:  Fair  Psychomotor Activity:  Normal  Concentration:  Concentration: Good and Attention Span: Good  Recall:  Good  Fund of Knowledge:Good  Language: Good  Akathisia:  No  Handed:    AIMS (if indicated):  not done  Assets:  Communication Skills Desire for Improvement Financial Resources/Insurance Housing  ADL's:  Intact  Cognition: WNL  Sleep:  Fair   Screenings: GAD-7    Flowsheet Row Office Visit from 11/19/2017 in [place]  Total GAD-7 Score 3      PHQ2-9    [place] Visit from 02/09/2021 in [facility] Video Visit from 08/01/2020 in [place] Office Visit from 02/08/2020 in [place] Visit from 11/19/2017 in [place] Visit from 08/26/2016 in [place]  PHQ-2 Total Score 0 1 0 1 2  PHQ-9 Total Score 2 -- -- 6 5      Flowsheet Row Video Visit from 05/28/2021 in [place] Office Visit from 02/22/2021 in [place] Office Visit from 12/05/2020 in [place] No Risk No Risk No Risk       Assessment and Plan: as follows  Prior documentation reviewed  Panic attacks; sporadic, she has been using ativan prn,  can continue so not wo worry about anxiety or anticpation anxiety   anxiety  unspecified: manages better when ativan is kept as choice, discussed not to stop and use prn Insomnia: reviewed sleep hygiene, has sleep study next week, keep elavil for now small dose  Fu 24m  NMerian Capron MD 1/9/202311:41 AM

## 2021-06-05 ENCOUNTER — Ambulatory Visit (HOSPITAL_BASED_OUTPATIENT_CLINIC_OR_DEPARTMENT_OTHER): Payer: 59 | Admitting: Internal Medicine

## 2021-06-05 DIAGNOSIS — G4719 Other hypersomnia: Secondary | ICD-10-CM

## 2021-07-21 ENCOUNTER — Other Ambulatory Visit: Payer: Self-pay

## 2021-07-21 ENCOUNTER — Emergency Department (INDEPENDENT_AMBULATORY_CARE_PROVIDER_SITE_OTHER)
Admission: RE | Admit: 2021-07-21 | Discharge: 2021-07-21 | Disposition: A | Discharge status: Home / Self Care | Payer: 59 | Source: Ambulatory Visit | Attending: Family Medicine | Admitting: Family Medicine

## 2021-07-21 VITALS — BP 121/83 | HR 88 | Temp 98.6°F | Resp 18 | Ht 65.0 in | Wt 140.0 lb

## 2021-07-21 DIAGNOSIS — J029 Acute pharyngitis, unspecified: Secondary | ICD-10-CM

## 2021-07-21 DIAGNOSIS — H9201 Otalgia, right ear: Secondary | ICD-10-CM | POA: Diagnosis not present

## 2021-07-21 LAB — POCT RAPID STREP A (OFFICE): Rapid Strep A Screen: NEGATIVE

## 2021-07-21 MED ORDER — AZITHROMYCIN 250 MG PO TABS: 250.0000 mg | ORAL_TABLET | Freq: Every day | ORAL | 0 refills | Status: DC

## 2021-07-21 NOTE — ED Provider Notes (Signed)
[name]    CSN: [id number] Arrival date & time: 07/21/21  1047      History   Chief Complaint Chief Complaint  Patient presents with   Appointment   Otalgia    HPI Kelsey [name] is a 41 y.o. female.   HPI  Patient is here for sore throat.  Is been present for 2 weeks.  She states that she also has developed ear pain more prominent in the right ear.  Her hearing has been normal.  She states that her son brought him a letter stating there was strep in his class.  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   Arthralgia 06/09/2017   Abnormal laboratory test 09/07/2014   Asthma, mild persistent 03/08/2014   GERD (gastroesophageal reflux disease) 03/08/2014   Seasonal allergies 03/08/2014   Irritable bowel syndrome with diarrhea 03/08/2014   Anxiety and depression 03/08/2014   Chronic daily headache 12/31/2013   Vertigo 12/31/2013   Postconcussive syndrome 12/31/2013    Past Surgical History:  Procedure Laterality Date   CESAREAN SECTION N/A 07/08/2015   Procedure: CESAREAN SECTION;  Surgeon: Aloha Gell, MD;  Location: [facility];  Service: Obstetrics;  Laterality: N/A;   ESOPHAGOGASTRODUODENOSCOPY     [facility] 2009-2010   NO PAST SURGERIES      OB History     Gravida  1   Para  1   Term  1   Preterm      AB      Living  1      SAB      IAB      Ectopic      Multiple  0   Live Births  1             Home Medications    Prior to Admission medications   Medication Sig Start Date End Date Taking? Authorizing Provider  albuterol (VENTOLIN HFA) 108 (90 Base) MCG/ACT inhaler INHALE 2 PUFFS INTO THE LUNGS EVERY 4 (FOUR) HOURS AS NEEDED FOR WHEEZING. 03/20/20  Yes Jessup, Joy, NP  amitriptyline (ELAVIL) 25 MG tablet TAKE 1 TABLET BY MOUTH EVERYDAY AT BEDTIME 05/22/21  Yes Merian Capron, MD  azithromycin (ZITHROMAX) 250 MG tablet Take 1 tablet (250 mg total) by mouth daily. Take first 2 tablets together, then 1 every day until finished. 07/21/21  Yes Raylene Everts, MD  BIOTIN PO Take by mouth.   Yes [provider]  cetirizine (ZYRTEC) 10 MG tablet Take 10 mg by mouth daily.   Yes [provider]  diclofenac sodium (VOLTAREN) 1 % GEL Apply 4 g topically 4 (four) times daily. To affected joint. 06/09/17  Yes Gregor Hams, MD  dicyclomine (BENTYL) 10 MG capsule Take 1-2 capsules (10-20 mg total) by mouth 2 (two) times daily as needed for spasms. **PATIENT NEEDS OFFICE VISIT FOR ADDITIONAL  REFILLS** 06/23/20  Yes Jessup, Joy, NP  fluticasone (FLONASE) 50 MCG/ACT nasal spray SPRAY 2 SPRAYS INTO EACH NOSTRIL EVERY DAY 12/30/18  Yes Gregor Hams, MD  hydrOXYzine (ATARAX) 25 MG tablet TAKE 1 TABLET (25 MG TOTAL) BY MOUTH AT BEDTIME AS NEEDED FOR ANXIETY (AND SLEEP). 05/22/21  Yes Samuel Bouche, NP  JUNEL 1/20 1-20 MG-MCG tablet TAKE 1 (ONE) TABLET DAILY CONTINUOUS ACTIVE PILLS 04/03/21  Yes Samuel Bouche, NP  LORazepam (ATIVAN) 0.5 MG tablet Take 1 tablet (0.5 mg total) by mouth daily as needed for anxiety. 05/28/21  Yes Merian Capron, MD  meclizine (ANTIVERT) 25 MG tablet Take 1 tablet (25 mg total) by mouth 3 (three) times daily as needed for dizziness. 02/08/20  Yes Samuel Bouche, NP  ondansetron (ZOFRAN-ODT) 4 MG disintegrating tablet Take 1 tablet (4 mg total) by mouth every 8 (eight) hours as needed for nausea or vomiting. 02/08/20  Yes Jessup, Joy, NP  pantoprazole (PROTONIX) 40 MG tablet  TAKE 1 TABLET BY MOUTH EVERY DAY 01/31/21  Yes Samuel Bouche, NP  Prenatal Vit-Fe Fumarate-FA (PRENATAL MULTIVITAMIN) TABS tablet Take 1 tablet by mouth daily at 12 noon.   Yes [provider]  propranolol (INDERAL) 20 MG tablet TAKE 1 TABLET 3 TIMES A DAY AS NEEDED ANXIETY/STRESS WITH BLOOD PRESSURE ELEVATION 05/15/21  Yes Samuel Bouche, NP    Family History Family History  Problem Relation Age of Onset   Hypertension Mother    Cancer Mother        multiple myeloma   Diabetes Mother    Hypertension Father    Stroke Father    Cancer Maternal Grandmother        CLL   Post-traumatic stress disorder Maternal Uncle    Crohn's disease Cousin        maternal grandfather sister daughter   Colon cancer Neg Hx    Esophageal cancer Neg Hx     Social History Social History   Tobacco Use   Smoking status: Never   Smokeless tobacco: Never  Vaping Use   Vaping Use: Never used  Substance Use Topics   Alcohol use: No    Alcohol/week: 0.0 standard drinks   Drug use: No     Allergies   Catfish [fish allergy], Citric acid, Fish-derived products, Pineapple, Banana, Flavoring agent, Monosodium glutamate, Promethazine hcl, Tomato, and Amoxicillin   Review of Systems Review of Systems See HPI  Physical Exam Triage Vital Signs ED Triage Vitals  Enc Vitals Group     BP 07/21/21 1102 121/83     Pulse Rate 07/21/21 1102 88     Resp 07/21/21 1102 18     Temp 07/21/21 1102 98.6 F (37 C)     Temp Source 07/21/21 1102 Oral     SpO2 07/21/21 1102 100 %     Weight 07/21/21 1103 140 lb (63.5 kg)     Height 07/21/21 1103 '5\' 5"'  (1.651 m)     Head Circumference --      Peak Flow --      Pain Score 07/21/21 1103 4     Pain Loc --      Pain Edu? --      Excl. in [place]? --    No data found.  Updated Vital Signs BP 121/83 (BP Location: Right Arm)    Pulse 88    Temp 98.6 F (37 C) (Oral)    Resp 18    Ht '5\' 5"'  (1.651 m)    Wt 63.5 kg  SpO2 100%    Breastfeeding No    BMI 23.30 kg/m        Physical Exam Constitutional:      General: She is not in acute distress.    Appearance: She is well-developed. She is not ill-appearing.  HENT:     Head: Normocephalic and atraumatic.     Right Ear: Tympanic membrane, ear canal and external ear normal. There is no impacted cerumen.     Left Ear: Tympanic membrane, ear canal and external ear normal. There is no impacted cerumen.     Nose: No congestion.     Mouth/Throat:     Mouth: Mucous membranes are moist.     Pharynx: Posterior oropharyngeal erythema present.     Comments: Posterior pharynx and tonsils with erythema.  Slight swelling.  No exudate Eyes:     Conjunctiva/sclera: Conjunctivae normal.     Pupils: Pupils are equal, round, and reactive to light.  Cardiovascular:     Rate and Rhythm: Normal rate.  Pulmonary:     Effort: Pulmonary effort is normal. No respiratory distress.  Abdominal:     General: There is no distension.     Palpations: Abdomen is soft.  Musculoskeletal:        General: Normal range of motion.     Cervical back: Normal range of motion.  Lymphadenopathy:     Cervical: No cervical adenopathy.  Skin:    General: Skin is warm and dry.  Neurological:     Mental Status: She is alert.     UC Treatments / Results  Labs (all labs ordered are listed, but only abnormal results are displayed) Labs Reviewed  CULTURE, GROUP A STREP  POCT RAPID STREP A (OFFICE)    EKG   Radiology No results found.  Procedures Procedures (including critical care time)  Medications Ordered in UC Medications - No data to display  Initial Impression / Assessment and Plan / UC Course  I have reviewed the triage vital signs and the nursing notes.  Pertinent labs & imaging results that were available during my care of the patient were reviewed by me and considered in my medical decision making (see chart for details).     Strep test is negative.  Culture sent.  Patient is given an antibiotic because of  the longstanding nature of her sore throat. Final Clinical Impressions(s) / UC Diagnoses   Final diagnoses:  Otalgia of right ear  Sore throat     Discharge Instructions      Rapid strep test is negative.  Throat culture will be sent to laboratory.  You will be called if anything shows I am placing you on an antibiotic for the persistent throat and ear infection May take Tylenol, ibuprofen, Aleve as needed for pain Drink lots of water Call your doctor if not improving by next week     ED Prescriptions     Medication Sig Dispense Auth. Provider   azithromycin (ZITHROMAX) 250 MG tablet Take 1 tablet (250 mg total) by mouth daily. Take first 2 tablets together, then 1 every day until finished. 6 tablet Raylene Everts, MD      PDMP not reviewed this encounter.   Raylene Everts, MD 07/21/21 [phone]

## 2021-07-21 NOTE — Discharge Instructions (Addendum)
Rapid strep test is negative.  Throat culture will be sent to laboratory.  You will be called if anything shows ?I am placing you on an antibiotic for the persistent throat and ear infection ?May take Tylenol, ibuprofen, Aleve as needed for pain ?Drink lots of water ?Call your doctor if not improving by next week ?

## 2021-07-21 NOTE — ED Triage Notes (Signed)
Patient c/o sore throat for several weeks, right ear pain that feels like drainage into throat.  Patient does feel some fullness in both ears.  Some headaches this week.  Patient denies any OTC pain meds. ?

## 2021-07-25 ENCOUNTER — Telehealth (HOSPITAL_COMMUNITY): Payer: Self-pay

## 2021-07-25 LAB — CULTURE, GROUP A STREP: Strep A Culture: NEGATIVE

## 2021-07-25 NOTE — Telephone Encounter (Signed)
Medication refill request - Fax from patient's [facility] for a new Lorazepam order, last provided 05/28/21 and patient is next scheduled on 08/10/21.  ?

## 2021-07-26 MED ORDER — LORAZEPAM 0.5 MG PO TABS
0.5000 mg | ORAL_TABLET | Freq: Every day | ORAL | 0 refills | Status: DC | PRN
Start: 2021-07-26 — End: 2021-11-30

## 2021-08-08 ENCOUNTER — Emergency Department (INDEPENDENT_AMBULATORY_CARE_PROVIDER_SITE_OTHER)
Admission: RE | Admit: 2021-08-08 | Discharge: 2021-08-08 | Disposition: A | Discharge status: Other Acute Inpatient Hospital | Payer: 59 | Source: Ambulatory Visit | Attending: Family Medicine | Admitting: Family Medicine

## 2021-08-08 ENCOUNTER — Encounter (HOSPITAL_BASED_OUTPATIENT_CLINIC_OR_DEPARTMENT_OTHER): Payer: 59 | Admitting: Internal Medicine

## 2021-08-08 ENCOUNTER — Other Ambulatory Visit: Payer: Self-pay

## 2021-08-08 VITALS — BP 140/84 | HR 110 | Temp 98.7°F | Resp 14

## 2021-08-08 DIAGNOSIS — R42 Dizziness and giddiness: Secondary | ICD-10-CM

## 2021-08-08 NOTE — Discharge Instructions (Signed)
Go to the emergency room for additional evaluation and care ?
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
## 2021-08-09 ENCOUNTER — Ambulatory Visit: Payer: 59 | Admitting: Medical-Surgical

## 2021-08-10 ENCOUNTER — Telehealth (HOSPITAL_COMMUNITY): Payer: 59 | Admitting: Psychiatry

## 2021-08-10 ENCOUNTER — Telehealth: Payer: Self-pay | Admitting: General Practice

## 2021-08-10 NOTE — Telephone Encounter (Signed)
Transition Care Management Unsuccessful Follow-up Telephone Call ? ?Date of discharge and from where:  08/08/21 from [facility] ? ?Attempts:  1st Attempt ? ?Reason for unsuccessful TCM follow-up call:  Left voice message ? ?  ?

## 2021-08-13 NOTE — Telephone Encounter (Signed)
Transition Care Management Unsuccessful Follow-up Telephone Call ? ?Date of discharge and from where:  08/08/21 from [place] ? ?Attempts:  2nd Attempt ? ?Reason for unsuccessful TCM follow-up call:  Left voice message ? ?  ?

## 2021-08-15 NOTE — Telephone Encounter (Signed)
Transition Care Management Unsuccessful Follow-up Telephone Call ? ?Date of discharge and from where:  08/08/21 from [facility] ? ?Attempts:  3rd Attempt ? ?Reason for unsuccessful TCM follow-up call:  No answer/busy Patient has an appointment tomorrow 08/16/21. ? ?  ?

## 2021-08-16 ENCOUNTER — Other Ambulatory Visit: Payer: Self-pay | Admitting: Medical-Surgical

## 2021-08-16 ENCOUNTER — Other Ambulatory Visit (HOSPITAL_COMMUNITY): Payer: Self-pay | Admitting: Psychiatry

## 2021-08-16 ENCOUNTER — Encounter: Payer: Self-pay | Admitting: Medical-Surgical

## 2021-08-16 ENCOUNTER — Ambulatory Visit (INDEPENDENT_AMBULATORY_CARE_PROVIDER_SITE_OTHER): Payer: 59 | Admitting: Medical-Surgical

## 2021-08-16 VITALS — BP 110/74 | HR 103 | Resp 20 | Ht 65.0 in | Wt 150.6 lb

## 2021-08-16 DIAGNOSIS — Z09 Encounter for follow-up examination after completed treatment for conditions other than malignant neoplasm: Secondary | ICD-10-CM | POA: Diagnosis not present

## 2021-08-16 DIAGNOSIS — M542 Cervicalgia: Secondary | ICD-10-CM

## 2021-08-16 DIAGNOSIS — M545 Low back pain, unspecified: Secondary | ICD-10-CM

## 2021-08-16 DIAGNOSIS — R42 Dizziness and giddiness: Secondary | ICD-10-CM
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
## 2021-08-16 NOTE — Patient Instructions (Signed)
Concussion, Adult °A concussion is a brain injury from a hard, direct hit (trauma) to the head or body. This direct hit causes the brain to shake quickly back and forth inside the skull. This can damage brain cells and cause chemical changes in the brain. A concussion may also be known as a mild traumatic brain injury (TBI). °Concussions are usually not life-threatening, but the effects of a concussion can be serious. If you have a concussion, you should be very careful to avoid having a second concussion. °What are the causes? °This condition is caused by: °A direct hit to your head, such as: °Running into another player during a game. °Being hit in a fight. °Hitting your head on a hard surface. °Sudden movement of your body that causes your brain to move back and forth inside the skull, such as in a car crash. °What are the signs or symptoms? °The signs of a concussion can be hard to notice. Early on, they may be missed by you, family members, and health care providers. You may look fine on the outside but may act or feel differently. °Every head injury is different. Symptoms are usually temporary but may last for days, weeks, or even months. Some symptoms appear right away, but other symptoms may not show up for hours or days. If your symptoms last longer than normal, you may have post-concussion syndrome. °Physical symptoms °Headaches. °Dizziness and problems with coordination or balance. °Sensitivity to light or noise. °Nausea or vomiting. °Tiredness (fatigue). °Vision or hearing problems. °Changes in eating or sleeping patterns. °Seizure. °Mental and emotional symptoms °Irritability or mood changes. °Memory problems. °Trouble concentrating, organizing, or making decisions. °Slowness in thinking, acting or reacting, speaking, or reading. °Anxiety or depression. °How is this diagnosed? °This condition is diagnosed based on: °Your symptoms. °A description of your injury. °You may also have tests,  including: °Imaging tests, such as a CT scan or an MRI. °Neuropsychological tests. These measure your thinking, understanding, learning, and remembering abilities. °How is this treated? °Treatment for this condition includes: °Stopping sports or activity if you are injured. If you hit your head or show signs of concussion: °Do not return to sports or activities the same day. °Get checked by a health care provider before you return to your activities. °Physical and mental rest and careful observation, usually at home. Gradually return to your normal activities. °Medicines to help with symptoms such as headaches, nausea, or difficulty sleeping. °Avoid taking opioid pain medicine while recovering from a concussion. °Avoiding alcohol and drugs. These may slow your recovery and can put you at risk of further injury. °Referral to a concussion clinic or rehabilitation center. °Recovery from a concussion can take time. How fast you recover depends on many factors. Return to activities only when: °Your symptoms are completely gone. °Your health care provider says that it is safe. °Follow these instructions at home: °Activity °Limit activities that require a lot of thought or concentration, such as: °Doing homework or job-related work. °Watching TV. °Working on the computer or phone. °Playing memory games and puzzles. °Rest. Rest helps your brain heal. Make sure you: °Get plenty of sleep. Most adults should get 7-9 hours of sleep each night. °Rest during the day. Take naps or rest breaks when you feel tired. °Avoid physical activity like exercise until your health care provider says it is safe. Stop any activity that worsens symptoms. °Do not do high-risk activities that could cause a second concussion, such as riding a bike or playing sports. °  Ask your health care provider when you can return to your normal activities, such as school, work, athletics, and driving. Your ability to react may be slower after a brain injury.  Never do these activities if you are dizzy. Your health care provider will likely give you a plan for gradually returning to activities. °General instructions ° °Take over-the-counter and prescription medicines only as told by your health care provider. Some medicines, such as blood thinners (anticoagulants) and aspirin, may increase the risk for complications, such as bleeding. °Do not drink alcohol until your health care provider says you can. °Watch your symptoms and tell others around you to do the same. Complications sometimes occur after a concussion. Older adults with a brain injury may have a higher risk of serious complications. °Tell your work manager, teachers, school nurse, school counselor, coach, or athletic trainer about your injury, symptoms, and restrictions. °Keep all follow-up visits as told by your health care provider. This is important. °How is this prevented? °Avoiding another brain injury is very important. In rare cases, another injury can lead to permanent brain damage, brain swelling, or death. The risk of this is greatest during the first 7-10 days after a head injury. Avoid injuries by: °Stopping activities that could lead to a second concussion, such as contact or recreational sports, until your health care provider says it is okay. °Taking these actions once you have returned to sports or activities: °Avoiding plays or moves that can cause you to crash into another person. This is how most concussions occur. °Following the rules and being respectful of other players. Do not engage in violent or illegal plays. °Getting regular exercise that includes strength and balance training. °Wearing a properly fitting helmet during sports, biking, or other activities. Helmets can help protect you from serious skull and brain injuries, but they may not protect you from a concussion. Even when wearing a helmet, you should avoid being hit in the head. °Contact a health care provider if: °Your  symptoms do not improve. °You have new symptoms. °You have another injury. °Get help right away if: °You have new or worsening physical symptoms, such as: °A severe or worsening headache. °Weakness or numbness in any part of your body, slurred speech, vision changes, or confusion. °Your coordination gets worse. °Vomiting repeatedly. °You have a seizure. °You have unusual behavior changes. °You lose consciousness, are sleepier than normal, or are difficult to wake up. °These symptoms may represent a serious problem that is an emergency. Do not wait to see if the symptoms will go away. Get medical help right away. Call your local emergency services (911 in the [place] Do not drive yourself to the hospital. °Summary °A concussion is a brain injury that results from a hard, direct hit (trauma) to your head or body. °You may have imaging tests and neuropsychological tests to diagnose a concussion. °Treatment for this condition includes physical and mental rest and careful observation. °Ask your health care provider when you can return to your normal activities, such as school, work, athletics, and driving. °Get help right away if you have a severe headache, weakness in any part of the body, seizures, behavior changes, changes in vision, or if you are confused or sleepier than normal. °This information is not intended to replace advice given to you by your health care provider. Make sure you discuss any questions you have with your health care provider. °Document Revised: 07/20/2020 Document Reviewed: 07/20/2020 °Elsevier Patient Education © 2022 [employer] ° °

## 2021-08-17 ENCOUNTER — Telehealth (INDEPENDENT_AMBULATORY_CARE_PROVIDER_SITE_OTHER): Payer: 59 | Admitting: Psychiatry

## 2021-08-17 ENCOUNTER — Encounter (HOSPITAL_COMMUNITY): Payer: Self-pay | Admitting: Psychiatry

## 2021-08-17 DIAGNOSIS — F410 Panic disorder [episodic paroxysmal anxiety] without agoraphobia: Secondary | ICD-10-CM | POA: Diagnosis not present

## 2021-08-17 DIAGNOSIS — F419 Anxiety disorder, unspecified: Secondary | ICD-10-CM

## 2021-08-17 DIAGNOSIS — F5102 Adjustment insomnia: Secondary | ICD-10-CM | POA: Diagnosis not present
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
## 2021-08-22 ENCOUNTER — Encounter: Payer: Self-pay | Admitting: Medical-Surgical

## 2021-08-22 MED ORDER — MECLIZINE HCL 25 MG PO TABS: 25.0000 mg | ORAL_TABLET | Freq: Three times a day (TID) | ORAL | 2 refills | Status: AC | PRN

## 2021-08-30 ENCOUNTER — Ambulatory Visit: Payer: 59 | Attending: Medical-Surgical | Admitting: Physical Therapy

## 2021-08-30 DIAGNOSIS — M545 Low back pain, unspecified: Secondary | ICD-10-CM | POA: Insufficient documentation

## 2021-08-30 DIAGNOSIS — R293 Abnormal posture: Secondary | ICD-10-CM

## 2021-08-30 DIAGNOSIS — M6281 Muscle weakness (generalized): Secondary | ICD-10-CM | POA: Diagnosis not present

## 2021-08-30 DIAGNOSIS — R252 Cramp and spasm: Secondary | ICD-10-CM | POA: Diagnosis not present

## 2021-08-30 DIAGNOSIS — X58XXXD Exposure to other specified factors, subsequent encounter: Secondary | ICD-10-CM | POA: Diagnosis not present

## 2021-08-30 DIAGNOSIS — R42 Dizziness and giddiness: Secondary | ICD-10-CM

## 2021-08-30 DIAGNOSIS — M542 Cervicalgia: Secondary | ICD-10-CM

## 2021-08-30 DIAGNOSIS — M5459 Other low back pain: Secondary | ICD-10-CM
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
## 2021-08-30 NOTE — Patient Instructions (Signed)
Access Code: [facility] ?URL: [email or web address] ?Date: 08/30/2021 ?Prepared by: Vernon Prey April Kirstie Peri ? ?Exercises ?- Seated Scapular Retraction  - 1 x daily - 7 x weekly - 2 sets - 10 reps - 3 sec hold ?- Standing Cervical Retraction  - 1 x daily - 7 x weekly - 2 sets - 10 reps - 3 sec hold ?- Seated Gentle Upper Trapezius Stretch  - 1 x daily - 7 x weekly - 2 sets - 30 sec hold ? ?Patient Education ?- Office Posture ?- Cervicogenic Headache ?- TENS Unit ?

## 2021-08-30 NOTE — Addendum Note (Signed)
Addended by: Jules Husbands MARIE L on: 08/30/2021 10:50 AM ? ? Modules accepted: Orders ? ?

## 2021-09-05 ENCOUNTER — Ambulatory Visit: Payer: 59 | Admitting: Physical Therapy

## 2021-09-05 DIAGNOSIS — R293 Abnormal posture: Secondary | ICD-10-CM

## 2021-09-05 DIAGNOSIS — M6281 Muscle weakness (generalized): Secondary | ICD-10-CM

## 2021-09-05 DIAGNOSIS — R252 Cramp and spasm: Secondary | ICD-10-CM

## 2021-09-05 DIAGNOSIS — M5459 Other low back pain: Secondary | ICD-10-CM

## 2021-09-05 DIAGNOSIS — R42 Dizziness and giddiness: Secondary | ICD-10-CM

## 2021-09-05 DIAGNOSIS — M542 Cervicalgia: Secondary | ICD-10-CM

## 2021-09-05 DIAGNOSIS — R29818 Other symptoms and signs involving the nervous system: Secondary | ICD-10-CM
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
## 2021-09-07 ENCOUNTER — Ambulatory Visit: Payer: 59 | Admitting: Physical Therapy

## 2021-09-07 DIAGNOSIS — M542 Cervicalgia: Secondary | ICD-10-CM

## 2021-09-07 DIAGNOSIS — M6281 Muscle weakness (generalized): Secondary | ICD-10-CM

## 2021-09-07 DIAGNOSIS — R293 Abnormal posture: Secondary | ICD-10-CM

## 2021-09-07 DIAGNOSIS — R29818 Other symptoms and signs involving the nervous system: Secondary | ICD-10-CM

## 2021-09-07 DIAGNOSIS — R252 Cramp and spasm: Secondary | ICD-10-CM

## 2021-09-07 DIAGNOSIS — M5459 Other low back pain: Secondary | ICD-10-CM

## 2021-09-07 DIAGNOSIS — R42 Dizziness and giddiness: Secondary | ICD-10-CM
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
## 2021-09-12 ENCOUNTER — Encounter (HOSPITAL_BASED_OUTPATIENT_CLINIC_OR_DEPARTMENT_OTHER): Payer: 59 | Admitting: Internal Medicine

## 2021-09-18 ENCOUNTER — Ambulatory Visit: Payer: 59 | Attending: Medical-Surgical | Admitting: Physical Therapy

## 2021-09-18 DIAGNOSIS — R41841 Cognitive communication deficit: Secondary | ICD-10-CM | POA: Diagnosis present

## 2021-09-18 DIAGNOSIS — R42 Dizziness and giddiness: Secondary | ICD-10-CM | POA: Insufficient documentation

## 2021-09-18 DIAGNOSIS — R29818 Other symptoms and signs involving the nervous system: Secondary | ICD-10-CM | POA: Diagnosis present

## 2021-09-18 DIAGNOSIS — R4701 Aphasia: Secondary | ICD-10-CM | POA: Diagnosis present

## 2021-09-18 DIAGNOSIS — M6281 Muscle weakness (generalized): Secondary | ICD-10-CM | POA: Diagnosis present

## 2021-09-18 DIAGNOSIS — R293 Abnormal posture: Secondary | ICD-10-CM | POA: Diagnosis present

## 2021-09-18 DIAGNOSIS — M542 Cervicalgia: Secondary | ICD-10-CM | POA: Diagnosis present

## 2021-09-18 DIAGNOSIS — R252 Cramp and spasm: Secondary | ICD-10-CM | POA: Insufficient documentation
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
## 2021-09-20 ENCOUNTER — Other Ambulatory Visit (HOSPITAL_COMMUNITY): Payer: Self-pay | Admitting: Psychiatry

## 2021-09-26 ENCOUNTER — Ambulatory Visit: Payer: 59 | Admitting: Physical Therapy

## 2021-09-26 DIAGNOSIS — R293 Abnormal posture: Secondary | ICD-10-CM

## 2021-09-26 DIAGNOSIS — R29818 Other symptoms and signs involving the nervous system: Secondary | ICD-10-CM

## 2021-09-26 DIAGNOSIS — M542 Cervicalgia: Secondary | ICD-10-CM

## 2021-09-26 DIAGNOSIS — M6281 Muscle weakness (generalized): Secondary | ICD-10-CM

## 2021-09-26 DIAGNOSIS — R42 Dizziness and giddiness: Secondary | ICD-10-CM

## 2021-09-26 DIAGNOSIS — R252 Cramp and spasm: Secondary | ICD-10-CM
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
## 2021-10-10 ENCOUNTER — Other Ambulatory Visit: Payer: Self-pay | Admitting: Medical-Surgical

## 2021-10-10 ENCOUNTER — Ambulatory Visit: Payer: 59 | Admitting: Physical Therapy

## 2021-10-10 DIAGNOSIS — R42 Dizziness and giddiness: Secondary | ICD-10-CM | POA: Diagnosis not present

## 2021-10-10 DIAGNOSIS — R293 Abnormal posture: Secondary | ICD-10-CM

## 2021-10-10 DIAGNOSIS — M542 Cervicalgia: Secondary | ICD-10-CM

## 2021-10-10 DIAGNOSIS — M6281 Muscle weakness (generalized): Secondary | ICD-10-CM

## 2021-10-10 DIAGNOSIS — S060XAD Concussion with loss of consciousness status unknown, subsequent encounter: Secondary | ICD-10-CM
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
## 2021-10-17 ENCOUNTER — Ambulatory Visit: Payer: 59

## 2021-10-17 DIAGNOSIS — R4701 Aphasia: Secondary | ICD-10-CM

## 2021-10-17 DIAGNOSIS — R42 Dizziness and giddiness: Secondary | ICD-10-CM | POA: Diagnosis not present

## 2021-10-17 DIAGNOSIS — R41841 Cognitive communication deficit: Secondary | ICD-10-CM
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
## 2021-10-17 NOTE — Patient Instructions (Addendum)
    [email or web address]  -Dr. Shelly Flatten web page  We didn't capture any deficits in the areas you are reporting by my standardized testing today, but that does NOT mean you are NOT having difficulty. Because of this, neuropsychological testing may be a good idea (especially if you can somehow find/pull your former neuropsych results from 2015.)   [employer] - app

## 2021-10-23 ENCOUNTER — Ambulatory Visit: Payer: 59 | Attending: Medical-Surgical | Admitting: Physical Therapy

## 2021-10-23 DIAGNOSIS — M542 Cervicalgia: Secondary | ICD-10-CM | POA: Insufficient documentation

## 2021-10-23 DIAGNOSIS — R42 Dizziness and giddiness: Secondary | ICD-10-CM | POA: Insufficient documentation

## 2021-10-23 DIAGNOSIS — R293 Abnormal posture: Secondary | ICD-10-CM | POA: Diagnosis present

## 2021-10-23 DIAGNOSIS — M6281 Muscle weakness (generalized): Secondary | ICD-10-CM | POA: Diagnosis present
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
## 2021-10-29 ENCOUNTER — Telehealth: Payer: Self-pay

## 2021-10-29 ENCOUNTER — Ambulatory Visit (HOSPITAL_BASED_OUTPATIENT_CLINIC_OR_DEPARTMENT_OTHER): Payer: 59 | Attending: Physician Assistant | Admitting: Internal Medicine

## 2021-10-29 DIAGNOSIS — G4719 Other hypersomnia: Secondary | ICD-10-CM | POA: Insufficient documentation

## 2021-10-29 DIAGNOSIS — G4700 Insomnia, unspecified: Secondary | ICD-10-CM | POA: Insufficient documentation

## 2021-10-29 DIAGNOSIS — R0683 Snoring: Secondary | ICD-10-CM | POA: Insufficient documentation

## 2021-10-29 NOTE — Telephone Encounter (Signed)
SLP spoke with pt at number in pt snapshot and SLP learned she is yet undecided on therapy vs. Neuropsych evaluation and then possible therapy after that evaluation. SLP told [name] to take her time in making the decision, that SLP has until 11/16/21 to formally d/c from ST. Pt indicated understanding.

## 2021-10-30 ENCOUNTER — Encounter: Payer: Self-pay | Admitting: Physical Therapy

## 2021-11-04 DIAGNOSIS — G4719 Other hypersomnia: Secondary | ICD-10-CM | POA: Diagnosis not present

## 2021-11-04 NOTE — Procedures (Signed)
    Patient Name: Kelsey [name], Kelsey [name] Date: 10/29/2021 Gender: Female D.O.B: 1980-08-10 Age (years): 40 Referring Provider: Tandy Gaw Height (inches): 65 Interpreting Physician: Jetty Duhamel MD, ABSM Weight (lbs): 140 RPSGT: Drayton Sink BMI: 23 MRN: [id number] Neck Size: 14.00  CLINICAL INFORMATION Sleep Study Type: HST Indication for sleep study: Insomnia with EDS Epworth Sleepiness Score: 8  SLEEP STUDY TECHNIQUE A multi-channel overnight portable sleep study was performed. The channels recorded were: nasal airflow, thoracic respiratory movement, and oxygen saturation with a pulse oximetry. Snoring was also monitored.  MEDICATIONS Patient self administered medications include: N/A.  SLEEP ARCHITECTURE Patient was studied for 418.3 minutes. The sleep efficiency was 100.0 % and the patient was supine for 0%. The arousal index was 0.0 per hour.  RESPIRATORY PARAMETERS The overall AHI was 1.6 per hour, with a central apnea index of 0 per hour. The oxygen nadir was 95% during sleep.  CARDIAC DATA Mean heart rate during sleep was 72.0 bpm.  IMPRESSIONS - No significant obstructive sleep apnea occurred during this study (AHI = 1.6/h). - The patient had minimal or no oxygen desaturation during the study (Min O2 = 95%) - Patient snored 36.7%.  DIAGNOSIS - Normal study - Primary Snoring  RECOMMENDATIONS - Manage for symptoms and snoring based on clinical judgment. - Be careful with alcohol, sedatives and other CNS depressants that may worsen sleep apnea and disrupt normal sleep architecture. - Sleep hygiene should be reviewed to assess factors that may improve sleep quality. - Weight management and regular exercise should be initiated or continued. - If test results are inconsistent with clinical impression, consider return for in-center ovenrnight study.  [Electronically signed] 11/04/2021 12:44 PM  Jetty Duhamel MD, ABSM Diplomate, American Board of  Sleep Medicine NPI: [id number]                         Jetty Duhamel Diplomate, American Board of Sleep Medicine  ELECTRONICALLY SIGNED ON:  11/04/2021, 12:42 PM  [facility] PH: [phone]   FX: [phone] ACCREDITED BY THE AMERICAN ACADEMY OF SLEEP MEDICINE

## 2021-11-05 NOTE — Progress Notes (Signed)
Attempted to contact patient regarding provider's note. No answer, left a brief message for patient to return a call back to the clinic for sleep study results. Direct info provided.

## 2021-11-06 ENCOUNTER — Ambulatory Visit: Payer: 59 | Admitting: Physical Therapy

## 2021-11-06 DIAGNOSIS — M6281 Muscle weakness (generalized): Secondary | ICD-10-CM

## 2021-11-06 DIAGNOSIS — M542 Cervicalgia: Secondary | ICD-10-CM

## 2021-11-06 DIAGNOSIS — R293 Abnormal posture: Secondary | ICD-10-CM

## 2021-11-06 DIAGNOSIS — R42 Dizziness and giddiness: Secondary | ICD-10-CM

## 2021-11-06 NOTE — Therapy (Addendum)
[facility] [place], [place], [address] Phone: [phone]   Fax:  [phone]  Physical Therapy Treatment and Discharge  Patient Details  Name: Kelsey [name] MRN: [id number] Date of Birth: 02-03-1981 Referring Provider (PT): Samuel Bouche  PHYSICAL THERAPY DISCHARGE SUMMARY  Visits from Start of Care: 8  Current functional level related to goals / functional outcomes: See below   Remaining deficits: See below   Education / Equipment: See below   Patient agrees to discharge. Patient goals were partially met. Patient is being discharged due to being pleased with the current functional level.   Rationale for Evaluation and Treatment Rehabilitation  Encounter Date: 11/06/2021   PT End of Session - 11/06/21 0932     Visit Number 8    Number of Visits 12    Date for PT Re-Evaluation 11/07/21    Authorization Type [employer]    PT Start Time 0932    PT Stop Time 1010    PT Time Calculation (min) 38 min    Activity Tolerance Patient tolerated treatment well    Behavior During Therapy WFL for tasks assessed/performed             Past Medical History:  Diagnosis Date   Anxiety and depression    Asthma    Brain injury ([place]) 2014   Depression    Endometriosis    Frequent headaches    GERD (gastroesophageal reflux disease)    Hx of varicella    IBS (irritable bowel syndrome)    Migraines    Panic attacks    Postpartum care following cesarean delivery (2/18) 07/08/2015   Seizure ([place])    Vertigo 12/31/2013    Past Surgical History:  Procedure Laterality Date   CESAREAN SECTION N/A 07/08/2015   Procedure: CESAREAN SECTION;  Surgeon: Aloha Gell, MD;  Location: [facility];  Service: Obstetrics;  Laterality: N/A;   ESOPHAGOGASTRODUODENOSCOPY     [facility] 2009-2010   NO PAST SURGERIES      There were no vitals filed for this visit.   Subjective Assessment - 11/06/21 0934      Subjective Pt states she woke up with some neck pain. Increased migraine today. Notes continued difficulties with raising her head on/off pillow without increased symptoms    Pertinent History Prior vestibular issues    Limitations House hold activities;Reading    How long can you sit comfortably? n/a    How long can you stand comfortably? n/a    How long can you walk comfortably? Able but wobbles a little bit                [facility] PT Assessment - 11/06/21 0001       Functional Gait  Assessment   Gait Level Surface Walks 20 ft in less than 5.5 sec, no assistive devices, good speed, no evidence for imbalance, normal gait pattern, deviates no more than 6 in outside of the 12 in walkway width.    Change in Gait Speed Able to smoothly change walking speed without loss of balance or gait deviation. Deviate no more than 6 in outside of the 12 in walkway width.    Gait with Horizontal Head Turns Performs head turns smoothly with no change in gait. Deviates no more than 6 in outside 12 in walkway width    Gait with Vertical Head Turns Performs head turns with no change in gait. Deviates no more than 6 in outside 12 in walkway  width.    Gait and Pivot Turn Pivot turns safely within 3 sec and stops quickly with no loss of balance.    Step Over Obstacle Is able to step over 2 stacked shoe boxes taped together (9 in total height) without changing gait speed. No evidence of imbalance.    Gait with Narrow Base of Support Ambulates 7-9 steps.    Gait with Eyes Closed Walks 20 ft, no assistive devices, good speed, no evidence of imbalance, normal gait pattern, deviates no more than 6 in outside 12 in walkway width. Ambulates 20 ft in less than 7 sec.    Ambulating Backwards Walks 20 ft, uses assistive device, slower speed, mild gait deviations, deviates 6-10 in outside 12 in walkway width.    Steps Alternating feet, no rail.    Total Score 28    FGA comment: 28/30                            [facility] Adult PT Treatment/Exercise - 11/06/21 0001       Manual Therapy   Soft tissue mobilization STM & TPR scalenes, UT on L             Vestibular Treatment/Exercise - 11/06/21 0001       Vestibular Treatment/Exercise   Vestibular Treatment Provided Habituation    Habituation Exercises Seated Vertical Head Turns    Gaze Exercises Eye/Head Exercise Horizontal      Horizontal Roll   Number of Reps  10      Seated Vertical Head Turns   Symptom Description  seated bend forward towards L & then R knee diagonals 2x10 each side      Eye/Head Exercise Horizontal   Foot Position seated    Comments smooth pursuit diagonals 2x10; saccades diagonal 2x10; corrective saccades diagonals 2x10                    PT Education - 11/06/21 1012     Education Details Discussed HEP modifications and what else to be working on    [employer]) Educated Patient    Methods Explanation;Demonstration;Tactile cues;Verbal cues;Handout    Comprehension Verbalized understanding;Returned demonstration;Verbal cues required;Tactile cues required              PT Short Term Goals - 09/18/21 1347       PT SHORT TERM GOAL #1   Title Pt will be independent with initial HEP    Time 2    Period Weeks    Status Achieved    Target Date 09/13/21      PT SHORT TERM GOAL #2   Title Pt will demo full pain free cervical ROM in all directions    Time 2    Period Weeks    Status Achieved    Target Date 09/13/21      PT SHORT TERM GOAL #3   Title Pt will report reduction of headaches and dizzy symptoms by >/=30%    Baseline 5/10 headache when driving; 77% reduction on 09/18/21    Time 2    Period Weeks    Status Achieved    Target Date 09/13/21      PT SHORT TERM GOAL #4   Title PT will assess FOTO and FGA and provide appropriate LTG    Time 2    Period Weeks    Status Deferred    Target Date 09/13/21  PT Long Term Goals - 11/06/21  1000       PT LONG TERM GOAL #1   Title Pt will be independent with all HEP and advancement of her HEP    Time 4    Period Weeks    Status Partially Met    Target Date 11/07/21      PT LONG TERM GOAL #2   Title Pt will report improvement in headache and dizziness by >/=50%    Baseline 5/10 and now a 3/10 on 10/10/21    Time 4    Period Weeks    Status Partially Met    Target Date 11/07/21      PT LONG TERM GOAL #3   Title Pt will have improved FGA score by at least 4 points to demo [place]    Baseline 26/30 on 10/10/21; 28/30 on 11/06/21    Time 4    Period Weeks    Status Partially Met    Target Date 11/07/21      PT LONG TERM GOAL #4   Title Pt will be able to perform all regular work and housework without exacerbation of symptoms    Baseline With compensatory strategies able to do all normal work/housework    Time 4    Period Weeks    Status Partially Met    Target Date 11/07/21                   Plan - 11/06/21 1013     Clinical Impression Statement Modified pt's HEP as performing cervical flexion/extension is still too much stimulus. Worked in seated. Most aggravated with L ant/R post canalith stimulation. Unable to visualize nystagmus. Discussed continuing her habituation exercises. Will follow up in a month for re-evaluation. Had some exacerbation of neck pain this morning -- provided manual work with improved symptoms.    Personal Factors and Comorbidities Age;Time since onset of injury/illness/exacerbation;Past/Current Experience    Examination-Activity Limitations Locomotion Level;Bend;Caring for Others;Dressing;Toileting    Examination-Participation Restrictions Church;Community Activity;Driving;Cleaning    PT Frequency 1x / week    PT Duration 4 weeks    PT Treatment/Interventions ADLs/Self Care Home Management;Canalith Repostioning;Cryotherapy;Electrical Stimulation;Iontophoresis 54m/ml Dexamethasone;Moist Heat;Traction;Gait training;Stair  training;Functional mobility training;Therapeutic activities;Therapeutic exercise;Balance training;Neuromuscular re-education;Manual techniques;Patient/family education;Passive range of motion;Dry needling;Taping;Vestibular;Spinal Manipulations    PT Next Visit Plan Assess response to HEP. Continue vestibular exercises with dual tasking. Continue balance work.    PT Home Exercise Plan [facility] [email or web address]   Consulted and Agree with Plan of Care Patient             Patient will benefit from skilled therapeutic intervention in order to improve the following deficits and impairments:  Decreased range of motion, Difficulty walking, Increased fascial restricitons, Dizziness, Increased muscle spasms, Impaired UE functional use, Pain, Decreased balance, Hypomobility, Impaired flexibility, Improper body mechanics, Decreased mobility, Decreased strength, Postural dysfunction  Visit Diagnosis: Dizziness and giddiness  Cervicalgia  Abnormal posture  Muscle weakness (generalized)     Problem List Patient Active Problem List   Diagnosis Date Noted   Encounter for female birth control 06/01/2019   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   Arthralgia 06/09/2017   Abnormal laboratory test 09/07/2014   Asthma, mild persistent 03/08/2014   GERD (gastroesophageal reflux disease) 03/08/2014   Seasonal allergies 03/08/2014   Irritable bowel syndrome with diarrhea 03/08/2014   Anxiety and depression 03/08/2014   Chronic daily headache 12/31/2013   Vertigo 12/31/2013   Postconcussive syndrome 12/31/2013  [facility] [address], PT, DPT 11/06/2021, 10:16 AM  [facility] [place], [place], [address] Phone: [phone]   Fax:  [phone]  Name: Kelsey [name] MRN: [id number] Date of Birth: 1980-07-29

## 2021-11-06 NOTE — Progress Notes (Signed)
Sent My-Chart message

## 2021-11-13 ENCOUNTER — Ambulatory Visit: Payer: 59 | Admitting: Physical Therapy

## 2021-11-16 ENCOUNTER — Encounter: Payer: Self-pay | Admitting: Medical-Surgical

## 2021-11-16 DIAGNOSIS — J4530 Mild persistent asthma, uncomplicated: Secondary | ICD-10-CM

## 2021-11-16 MED ORDER — ALBUTEROL SULFATE HFA 108 (90 BASE) MCG/ACT IN AERS: INHALATION_SPRAY | RESPIRATORY_TRACT | 1 refills | Status: AC

## 2021-11-27 ENCOUNTER — Encounter: Payer: Self-pay | Admitting: Family Medicine

## 2021-11-27 ENCOUNTER — Ambulatory Visit (INDEPENDENT_AMBULATORY_CARE_PROVIDER_SITE_OTHER): Payer: 59 | Admitting: Family Medicine

## 2021-11-27 DIAGNOSIS — J4530 Mild persistent asthma, uncomplicated: Secondary | ICD-10-CM

## 2021-11-27 DIAGNOSIS — J302 Other seasonal allergic rhinitis: Secondary | ICD-10-CM | POA: Diagnosis not present

## 2021-11-27 MED ORDER — PREDNISONE 50 MG PO TABS: ORAL_TABLET | ORAL | 0 refills | Status: DC

## 2021-11-27 MED ORDER — MONTELUKAST SODIUM 10 MG PO TABS: 10.0000 mg | ORAL_TABLET | Freq: Every day | ORAL | 3 refills | Status: DC

## 2021-11-27 NOTE — Assessment & Plan Note (Signed)
Continue cetirizine and flonase daily.  Singulair may provide some relief of this as well.

## 2021-11-27 NOTE — Progress Notes (Signed)
Kelsey [name] - 41 y.o. female MRN [id number]  Date of birth: 13-Mar-1981  Subjective Chief Complaint  Patient presents with   Allergic Rhinitis     HPI Kelsey [name] is a 41 y.o. female here today with complaint of increased allergy symptoms.  Has history of allergies and asthma.  Asthma is currently treated with albuterol as needed.  Not currently on a maintenance inhaler or singulair.  She is taking cetirizine and flonase dailyfor allergies without much relief at this time.   She has noted some increased cough and post nasal drainage.  She denies fever or chills.   ROS:  A comprehensive ROS was completed and negative except as noted per HPI  Allergies  Allergen Reactions   Catfish [Fish Allergy] Shortness Of Breath   Fish-Derived Products Shortness Of Breath   Banana Hives   Citric Acid Hives   Flavoring Agent Other (See Comments)    Mouth sores Headache   Monosodium Glutamate Other (See Comments)    Headache    Pineapple Hives   Promethazine Hcl Other (See Comments)    Restless motion   Tomato Other (See Comments)   Amoxicillin Other (See Comments)    Stomach pain    Past Medical History:  Diagnosis Date   Anxiety and depression    Asthma    Brain injury ([place]) 2014   Depression    Endometriosis    Frequent headaches    GERD (gastroesophageal reflux disease)    Hx of varicella    IBS (irritable bowel syndrome)    Migraines    Panic attacks    Postpartum care following cesarean delivery (2/18) 07/08/2015   Seizure ([place])    Vertigo 12/31/2013    Past Surgical History:  Procedure Laterality Date   CESAREAN SECTION N/A 07/08/2015   Procedure: CESAREAN SECTION;  Surgeon: Aloha Gell, MD;  Location: [facility];  Service: Obstetrics;  Laterality: N/A;   ESOPHAGOGASTRODUODENOSCOPY     [facility] 2009-2010   NO PAST SURGERIES      Social History   Socioeconomic History   Marital status: Married    Spouse name: Not on file   Number of children: 0    Years of education: Not on file   Highest education level: Not on file  Occupational History   Not on file  Tobacco Use   Smoking status: Never   Smokeless tobacco: Never  Vaping Use   Vaping Use: Never used  Substance and Sexual Activity   Alcohol use: No    Alcohol/week: 0.0 standard drinks of alcohol   Drug use: No   Sexual activity: Yes    Partners: Male    Birth control/protection: Pill  Other Topics Concern   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      Home Situation: lives with husband      Spiritual Beliefs: Christian      Lifestyle: doing 20 minutes of exercise and a little weight lifting daily; diet is healthy            Social Determinants of Radio broadcast assistant Strain: Not on file  Food Insecurity: Not on file  Transportation Needs: Not on file  Physical Activity: Not on file  Stress: Not on file  Social Connections: Not on file    Family History  Problem Relation Age of Onset   Hypertension Mother    Cancer Mother  multiple myeloma   Diabetes Mother    Hypertension Father    Stroke Father    Cancer Maternal Grandmother        CLL   Post-traumatic stress disorder Maternal Uncle    Crohn's disease Cousin        maternal grandfather sister daughter   Colon cancer Neg Hx    Esophageal cancer Neg Hx     Health Maintenance  Topic Date Due   COVID-19 Vaccine (3 - Moderna series) 12/13/2021 (Originally 12/03/2019)   INFLUENZA VACCINE  12/18/2021   PAP SMEAR-Modifier  02/09/2026   TETANUS/TDAP  09/24/2029   Hepatitis C Screening  Completed   HIV Screening  Completed   HPV VACCINES  Aged Out     ----------------------------------------------------------------------------------------------------------------------------------------------------------------------------------------------------------------- Physical Exam BP 126/75 (BP Location: Right Arm, Patient Position: Sitting, Cuff  Size: Normal)   Pulse 89   Ht _0  (1.651 m)   Wt 152 lb (68.9 kg)   SpO2 100%   BMI 25.29 kg/m   Physical Exam Constitutional:      Appearance: Normal appearance.  Eyes:     General: No scleral icterus. Cardiovascular:     Rate and Rhythm: Normal rate and regular rhythm.  Pulmonary:     Effort: Pulmonary effort is normal.     Breath sounds: Normal breath sounds.  Musculoskeletal:     Cervical back: Neck supple.  Neurological:     General: No focal deficit present.     Mental Status: She is alert.  Psychiatric:        Mood and Affect: Mood normal.        Behavior: Behavior normal.     ------------------------------------------------------------------------------------------------------------------------------------------------------------------------------------------------------------------- Assessment and Plan  Asthma, mild persistent She is having persistent cough.  Occasional wheezing  Continue albuterol as needed.  Adding burst of prednisone and will add singulair as well. Instructed to let us know if symptoms fail to improve with this.   Seasonal allergies Continue cetirizine and flonase daily.  Singulair may provide some relief of this as well.     Meds ordered this encounter  Medications   predniSONE (DELTASONE) 50 MG tablet    Sig: Take 1 tab po daily x5 days.    Dispense:  5 tablet    Refill:  0   montelukast (SINGULAIR) 10 MG tablet    Sig: Take 1 tablet (10 mg total) by mouth at bedtime.    Dispense:  30 tablet    Refill:  3    No follow-ups on file.    This visit occurred during the SARS-CoV-2 public health emergency.  Safety protocols were in place, including screening questions prior to the visit, additional usage of staff PPE, and extensive cleaning of exam room while observing appropriate contact time as indicated for disinfecting solutions.

## 2021-11-27 NOTE — Assessment & Plan Note (Signed)
She is having persistent cough.  Occasional wheezing  Continue albuterol as needed.  Adding burst of prednisone and will add singulair as well. Instructed to let us know if symptoms fail to improve with this.

## 2021-11-27 NOTE — Patient Instructions (Signed)
Continue cetirizine and flonase daily.  You may add delsym as needed for cough.   Let's try a short course of prednisone and add on singulair daily.  Let us know if you aren't seeing improvement with this.

## 2021-11-30 ENCOUNTER — Encounter (HOSPITAL_COMMUNITY): Payer: Self-pay | Admitting: Psychiatry

## 2021-11-30 ENCOUNTER — Telehealth (INDEPENDENT_AMBULATORY_CARE_PROVIDER_SITE_OTHER): Payer: 59 | Admitting: Psychiatry

## 2021-11-30 DIAGNOSIS — F410 Panic disorder [episodic paroxysmal anxiety] without agoraphobia: Secondary | ICD-10-CM | POA: Diagnosis not present

## 2021-11-30 DIAGNOSIS — F419 Anxiety disorder, unspecified: Secondary | ICD-10-CM

## 2021-11-30 DIAGNOSIS — F5102 Adjustment insomnia: Secondary | ICD-10-CM

## 2021-11-30 MED ORDER — LORAZEPAM 0.5 MG PO TABS: 0.5000 mg | ORAL_TABLET | Freq: Every day | ORAL | 0 refills | Status: DC | PRN

## 2021-11-30 NOTE — Progress Notes (Signed)
[place] Follow up visit  Patient Identification: Kelsey [name] MRN:  [id number] Date of Evaluation:  11/30/2021 Referral Source: GI  Chief Complaint:  follow up sleep, anxiety Visit Diagnosis:    ICD-10-CM   1. Anxiety disorder, unspecified type  F41.9     2. Panic attacks  F41.0     3. Adjustment insomnia  F51.02      Virtual Visit via Video Note  I connected with [name] on 11/30/21 at 10:00 AM EDT by a video enabled telemedicine application and verified that I am speaking with the correct person using two identifiers.  Location: Patient: home Provider: home office   I discussed the limitations of evaluation and management by telemedicine and the availability of in person appointments. The patient expressed understanding and agreed to proceed.     I discussed the assessment and treatment plan with the patient. The patient was provided an opportunity to ask questions and all were answered. The patient agreed with the plan and demonstrated an understanding of the instructions.   The patient was advised to call back or seek an in-person evaluation if the symptoms worsen or if the condition fails to improve as anticipated.  I provided 15 minutes of non-face-to-face time during this encounter including chart review and documentation    History of Present Illness: Patient is a 41 years old currently married African-American female referred by GI doctor for establishment of care for possible anxiety.  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 Overall doing better  Sleep improved by sleeping seperately, did not have sleep apnea  has tried other medications or SSRIs that has not worked   Aggravating factor: webinar presentations past  head injury, relationship can be Modifying factor: family Severity improved  Duration more so last few years   Past Psychiatric History: anxiety  Consequences of Substance Abuse: NA  Past Medical History:  Past Medical History:  Diagnosis Date   Anxiety and depression    Asthma    Brain injury ([place]) 2014   Depression    Endometriosis    Frequent headaches    GERD (gastroesophageal reflux disease)    Hx of varicella    IBS (irritable bowel syndrome)    Migraines    Panic attacks    Postpartum care following cesarean delivery (2/18) 07/08/2015   Seizure ([place])    Vertigo 12/31/2013    Past Surgical History:  Procedure Laterality Date   CESAREAN SECTION N/A 07/08/2015   Procedure: CESAREAN SECTION;  Surgeon: Aloha Gell, MD;  Location: [facility];  Service: Obstetrics;  Laterality: N/A;   ESOPHAGOGASTRODUODENOSCOPY     [facility] 2009-2010   NO PAST SURGERIES      Family Psychiatric History: Mom: OCD possibel  Family History:  Family History  Problem Relation Age of Onset   Hypertension Mother    Cancer Mother        multiple myeloma   Diabetes Mother    Hypertension Father    Stroke Father    Cancer Maternal Grandmother        CLL   Post-traumatic stress disorder Maternal Uncle    Crohn's disease Cousin        maternal grandfather sister daughter   Colon cancer Neg Hx    Esophageal  cancer Neg Hx     Social History:   Social History   Socioeconomic History   Marital status: Married    Spouse name: Not on file   Number of children: 0   Years of education: Not on file   Highest education level: Not on file  Occupational History   Not on file  Tobacco Use   Smoking status: Never   Smokeless tobacco: Never  Vaping Use   Vaping Use: Never used  Substance and Sexual Activity   Alcohol use: No    Alcohol/week: 0.0 standard drinks of alcohol   Drug use: No   Sexual activity: Yes    Partners: Male    Birth control/protection: Pill  Other  Topics Concern   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      Home Situation: lives with husband      Spiritual Beliefs: Christian      Lifestyle: doing 20 minutes of exercise and a little weight lifting daily; diet is healthy            Social Determinants of Radio broadcast assistant Strain: Not on file  Food Insecurity: Not on file  Transportation Needs: Not on file  Physical Activity: Not on file  Stress: Not on file  Social Connections: Not on file     Allergies:   Allergies  Allergen Reactions   Catfish [Fish Allergy] Shortness Of Breath   Fish-Derived Products Shortness Of Breath   Banana Hives   Citric Acid Hives   Flavoring Agent Other (See Comments)    Mouth sores Headache   Monosodium Glutamate Other (See Comments)    Headache    Pineapple Hives   Promethazine Hcl Other (See Comments)    Restless motion   Tomato Other (See Comments)   Amoxicillin Other (See Comments)    Stomach pain    Metabolic Disorder Labs: Lab Results  Component Value Date   HGBA1C 5.2 03/08/2014   No results found for: "PROLACTIN" Lab Results  Component Value Date   CHOL 166 02/09/2021   TRIG 74 02/09/2021   HDL 47 (L) 02/09/2021   CHOLHDL 3.5 02/09/2021   VLDL 11.4 03/08/2014   LDLCALC 103 (H) 02/09/2021   LDLCALC 85 02/11/2020   Lab Results  Component Value Date   TSH 1.50 02/11/2020    Therapeutic Level Labs: No results found for: "LITHIUM" No results found for: "CBMZ" No results found for: "VALPROATE"  Current Medications: Current Outpatient Medications  Medication Sig Dispense Refill   albuterol (VENTOLIN HFA) 108 (90 Base) MCG/ACT inhaler INHALE 2 PUFFS INTO THE LUNGS EVERY 4 (FOUR) HOURS AS NEEDED FOR WHEEZING. 8 g 1   amitriptyline (ELAVIL) 25 MG tablet TAKE 1 TABLET BY MOUTH EVERYDAY AT BEDTIME 90 tablet 1   baclofen (LIORESAL) 10 MG tablet Take 10 mg by mouth 3 (three) times daily.     BIOTIN PO  Take by mouth.     cetirizine (ZYRTEC) 10 MG tablet Take 10 mg by mouth daily.     diclofenac sodium (VOLTAREN) 1 % GEL Apply 4 g topically 4 (four) times daily. To affected joint. 100 g 11   EMGALITY 120 MG/ML SOAJ Inject 1 mL into the skin every 30 (thirty) days.     fluticasone (FLONASE) 50 MCG/ACT nasal spray SPRAY 2 SPRAYS INTO EACH NOSTRIL EVERY DAY 16 mL 12   JUNEL 1/20 1-20 MG-MCG tablet TAKE 1 (ONE) TABLET DAILY  CONTINUOUS ACTIVE PILLS 84 tablet 3   LORazepam (ATIVAN) 0.5 MG tablet Take 1 tablet (0.5 mg total) by mouth daily as needed for anxiety. 30 tablet 0   meclizine (ANTIVERT) 25 MG tablet Take 1 tablet (25 mg total) by mouth 3 (three) times daily as needed for dizziness. 30 tablet 2   montelukast (SINGULAIR) 10 MG tablet Take 1 tablet (10 mg total) by mouth at bedtime. 30 tablet 3   ondansetron (ZOFRAN-ODT) 4 MG disintegrating tablet Take 1 tablet (4 mg total) by mouth every 8 (eight) hours as needed for nausea or vomiting. 30 tablet 0   pantoprazole (PROTONIX) 40 MG tablet TAKE 1 TABLET BY MOUTH EVERY DAY 30 tablet 11   predniSONE (DELTASONE) 50 MG tablet Take 1 tab po daily x5 days. 5 tablet 0   Prenatal Vit-Fe Fumarate-FA (PRENATAL MULTIVITAMIN) TABS tablet Take 1 tablet by mouth daily at 12 noon.     propranolol (INDERAL) 20 MG tablet TAKE 1 TABLET 3 TIMES A DAY AS NEEDED ANXIETY/STRESS WITH BLOOD PRESSURE ELEVATION 60 tablet 0   UBRELVY 100 MG TABS Take by mouth.     No current facility-administered medications for this visit.     Psychiatric Specialty Exam: Review of Systems  Cardiovascular:  Negative for chest pain.  Psychiatric/Behavioral:  Negative for hallucinations and self-injury.     There were no vitals taken for this visit.There is no height or weight on file to calculate BMI.  General Appearance: casual  Eye Contact:  fair  Speech:  Normal Rate  Volume:  Normal  Mood: fair  Affect:  Full Range  Thought Process:  Goal Directed  Orientation:  Full (Time,  Place, and Person)  Thought Content:  Rumination  Suicidal Thoughts:  No  Homicidal Thoughts:  No  Memory:  Immediate;   Fair Recent;   Fair  Judgement:  Fair  Insight:  Fair  Psychomotor Activity:  Normal  Concentration:  Concentration: Good and Attention Span: Good  Recall:  Good  Fund of Knowledge:Good  Language: Good  Akathisia:  No  Handed:    AIMS (if indicated):  not done  Assets:  Communication Skills Desire for Improvement Financial Resources/Insurance Housing  ADL's:  Intact  Cognition: WNL  Sleep:  Fair   Screenings: GAD-7    [place] Office Visit from 08/16/2021 in [facility] Visit from 11/19/2017 in [facility]  Total GAD-7 Score 0 3      PHQ2-9    [place] Office Visit from 08/16/2021 in [facility] Visit from 02/09/2021 in [place] Video Visit from 08/01/2020 in [facility] Office Visit from 02/08/2020 in [facility] [place] Visit from 11/19/2017 in [facility]  PHQ-2 Total Score 0 0 1 0 1  PHQ-9 Total Score 2 2 -- -- 6      Flowsheet Row Video Visit from 11/30/2021 in [place] [facility] Video Visit from 08/17/2021 in [facility] from 08/08/2021 in [facility] No Risk No Risk No Risk       Assessment and Plan: as follows  Prior documentation reviewed   Panic attacks; sporadic, improved with ativan prn, can continue Ativan sent  anxiety unspecified: managing it better   Insomnia: fair  taking ellavil, sleep apnea rule out Fu 3-69m  NMerian Capron MD 7/14/202310:07 AM

## 2021-12-18 NOTE — Therapy (Signed)
[facility] [address] [place], [place], [address] Phone: [phone]   Fax:  [phone]  Patient Details  Name: Kelsey [name] MRN: [id number] Date of Birth: 04/28/1981 Referring Provider:  Samuel Bouche, MD  Encounter Date: 12/18/2021  SPEECH THERAPY DISCHARGE SUMMARY  Visits from Start of Care: 1 (evaluation)  Current functional level related to goals / functional outcomes: At time of eval, pt was desiring to decide on pursuing ST after neuropsych eval. To SLP knowledge this has not been scheduled. SLP happy to re-evaluate pt after neuropsych evaluation if recommended and if pt desires skilled ST.   Remaining deficits: See evaluation dated 10-17-21.   Education / Equipment: See evaluation dated 10-17-21.   Patient agrees to discharge. Patient goals were not met. Patient is being discharged due to not returning since the last visit.. Again it is possible for pt to pursue skilled ST if desired after neuropsych evaluation ; Another script will be necessary.    [place], [place] 12/18/2021, 5:05 PM  [facility] [address], [place], [place], [address] Phone: [phone]   Fax:  [phone]

## 2021-12-27 ENCOUNTER — Encounter: Payer: Self-pay | Admitting: Psychology

## 2021-12-27 ENCOUNTER — Encounter: Payer: Self-pay | Admitting: Medical-Surgical

## 2021-12-27 ENCOUNTER — Telehealth (INDEPENDENT_AMBULATORY_CARE_PROVIDER_SITE_OTHER): Payer: 59 | Admitting: Medical-Surgical

## 2021-12-27 DIAGNOSIS — R413 Other amnesia: Secondary | ICD-10-CM | POA: Diagnosis not present

## 2021-12-27 DIAGNOSIS — R4189 Other symptoms and signs involving cognitive functions and awareness: Secondary | ICD-10-CM | POA: Diagnosis not present

## 2021-12-27 NOTE — Addendum Note (Signed)
Addended byChristen Butter on: 12/27/2021 12:35 PM   Modules accepted: Orders
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
## 2022-01-21 ENCOUNTER — Other Ambulatory Visit: Payer: Self-pay | Admitting: Medical-Surgical

## 2022-01-31 ENCOUNTER — Encounter: Payer: Self-pay | Admitting: Sports Medicine

## 2022-01-31 ENCOUNTER — Ambulatory Visit (INDEPENDENT_AMBULATORY_CARE_PROVIDER_SITE_OTHER): Payer: 59 | Admitting: Sports Medicine

## 2022-01-31 DIAGNOSIS — J4530 Mild persistent asthma, uncomplicated: Secondary | ICD-10-CM | POA: Diagnosis not present

## 2022-01-31 MED ORDER — BENZONATATE 200 MG PO CAPS: 200.0000 mg | ORAL_CAPSULE | Freq: Three times a day (TID) | ORAL | 0 refills | Status: DC | PRN

## 2022-01-31 MED ORDER — PREDNISONE 50 MG PO TABS: 50.0000 mg | ORAL_TABLET | Freq: Every day | ORAL | 0 refills | Status: DC

## 2022-01-31 MED ORDER — HYDROCOD POLI-CHLORPHE POLI ER 10-8 MG/5ML PO SUER: 5.0000 mL | Freq: Two times a day (BID) | ORAL | 0 refills | Status: DC | PRN

## 2022-01-31 MED ORDER — AZITHROMYCIN 250 MG PO TABS: ORAL_TABLET | ORAL | 0 refills | Status: DC

## 2022-01-31 NOTE — Progress Notes (Signed)
    Procedures performed today:    None.  Independent interpretation of notes and tests performed by another provider:   None.  Brief History, Exam, Impression, and Recommendations:    Asthma, mild persistent This is a very pleasant 41 year old female, for the past week or so she has had a persistent cough, minimally productive, sore throat and postnasal drip, no muscle aches, body aches, fevers or chills, she did a COVID test and it was negative, there are other sick contacts in her household. She does not smoke. Her exam is for the most part unrevealing, very minimal pharyngeal erythema, the rest of the head and neck exam is normal, lungs are clear. I think this is more of a bronchitis, considering her asthma we will treat aggressively, azithromycin, prednisone, Tussionex at night and Tessalon Perles during the day. He does get some jitteriness with albuterol use so I have asked her to look up Xopenex and we can certainly consider switching her to this instead.    ____________________________________________ Ihor Austin. Benjamin Stain, M.D., ABFM., CAQSM., AME. Primary Care and Sports Medicine [facility]  Adjunct Professor of Family Medicine  [employer]  Restaurant manager, fast food

## 2022-01-31 NOTE — Assessment & Plan Note (Signed)
This is a very pleasant 42 year old female, for the past week or so she has had a persistent cough, minimally productive, sore throat and postnasal drip, no muscle aches, body aches, fevers or chills, she did a COVID test and it was negative, there are other sick contacts in her household. She does not smoke. Her exam is for the most part unrevealing, very minimal pharyngeal erythema, the rest of the head and neck exam is normal, lungs are clear. I think this is more of a bronchitis, considering her asthma we will treat aggressively, azithromycin, prednisone, Tussionex at night and Tessalon Perles during the day. He does get some jitteriness with albuterol use so I have asked her to look up Xopenex and we can certainly consider switching her to this instead.

## 2022-02-01 ENCOUNTER — Ambulatory Visit: Payer: 59 | Admitting: Medical-Surgical

## 2022-02-01 ENCOUNTER — Encounter: Payer: Self-pay | Admitting: Medical-Surgical

## 2022-02-14 ENCOUNTER — Other Ambulatory Visit (HOSPITAL_COMMUNITY): Payer: Self-pay | Admitting: Psychiatry

## 2022-02-14 ENCOUNTER — Other Ambulatory Visit: Payer: Self-pay | Admitting: Medical-Surgical

## 2022-02-14 DIAGNOSIS — Z30019 Encounter for initial prescription of contraceptives, unspecified: Secondary | ICD-10-CM

## 2022-02-17 ENCOUNTER — Other Ambulatory Visit: Payer: Self-pay | Admitting: Medical-Surgical

## 2022-02-25 ENCOUNTER — Encounter (HOSPITAL_COMMUNITY): Payer: Self-pay | Admitting: Psychiatry

## 2022-02-25 ENCOUNTER — Telehealth (INDEPENDENT_AMBULATORY_CARE_PROVIDER_SITE_OTHER): Payer: 59 | Admitting: Psychiatry

## 2022-02-25 DIAGNOSIS — F5102 Adjustment insomnia: Secondary | ICD-10-CM

## 2022-02-25 DIAGNOSIS — F410 Panic disorder [episodic paroxysmal anxiety] without agoraphobia: Secondary | ICD-10-CM | POA: Diagnosis not present

## 2022-02-25 DIAGNOSIS — F419 Anxiety disorder, unspecified: Secondary | ICD-10-CM

## 2022-02-25 MED ORDER — AMITRIPTYLINE HCL 25 MG PO TABS: ORAL_TABLET | ORAL | 1 refills | Status: DC

## 2022-02-25 NOTE — Progress Notes (Signed)
[place] Follow up visit  Patient Identification: Kelsey [name] MRN:  [id number] Date of Evaluation:  02/25/2022 Referral Source: GI  Chief Complaint:  follow up sleep, anxiety Visit Diagnosis:    ICD-10-CM   1. Anxiety disorder, unspecified type  F41.9     2. Panic attacks  F41.0     3. Adjustment insomnia  F51.02      Virtual Visit via Video Note  I connected with Kelsey [name] on 02/25/22 at  9:30 AM EDT by a video enabled telemedicine application and verified that I am speaking with the correct person using two identifiers.  Location: Patient:home Provider: home office   I discussed the limitations of evaluation and management by telemedicine and the availability of in person appointments. The patient expressed understanding and agreed to proceed.     I discussed the assessment and treatment plan with the patient. The patient was provided an opportunity to ask questions and all were answered. The patient agreed with the plan and demonstrated an understanding of the instructions.   The patient was advised to call back or seek an in-person evaluation if the symptoms worsen or if the condition fails to improve as anticipated.  I provided 15 minutes of non-face-to-face time during this encounter including chart review and documentation    History of Present Illness: Patient is a 41 years old currently married African-American female referred by GI doctor for establishment of care for possible anxiety.  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 as well Marital therapy helpful somewhat  Sleep improved by sleeping seperately, did not have sleep apnea has tried other medications or SSRIs that has not worked   Aggravating factor: webinar  presentations past head injury, relationship can be Modifying factor: family Severity fair  Duration more so last few years   Past Psychiatric History: anxiety  Consequences of Substance Abuse: NA  Past Medical History:  Past Medical History:  Diagnosis Date   Anxiety and depression    Asthma    Brain injury ([place]) 2014   Depression    Endometriosis    Frequent headaches    GERD (gastroesophageal reflux disease)    Hx of varicella    IBS (irritable bowel syndrome)    Migraines    Panic attacks    Postpartum care following cesarean delivery (2/18) 07/08/2015   Seizure ([place])    Vertigo 12/31/2013    Past Surgical History:  Procedure Laterality Date   CESAREAN SECTION N/A 07/08/2015   Procedure: CESAREAN SECTION;  Surgeon: Aloha Gell, MD;  Location: [facility];  Service: Obstetrics;  Laterality: N/A;   ESOPHAGOGASTRODUODENOSCOPY     [facility] 2009-2010   NO PAST SURGERIES      Family Psychiatric History: Mom: OCD possibel  Family History:  Family History  Problem Relation Age of Onset   Hypertension Mother    Cancer Mother        multiple myeloma   Diabetes Mother    Hypertension Father    Stroke Father    Cancer Maternal Grandmother        CLL   Post-traumatic stress disorder Maternal Uncle    Crohn's disease Cousin        maternal grandfather sister daughter   Colon cancer Neg Hx  Esophageal cancer Neg Hx     Social History:   Social History   Socioeconomic History   Marital status: Married    Spouse name: Not on file   Number of children: 0   Years of education: Not on file   Highest education level: Not on file  Occupational History   Not on file  Tobacco Use   Smoking status: Never   Smokeless tobacco: Never  Vaping Use   Vaping Use: Never used  Substance and Sexual Activity   Alcohol use: No    Alcohol/week: 0.0 standard drinks of alcohol   Drug use: No   Sexual activity: Yes    Partners: Male    Birth control/protection:  Pill  Other Topics Concern   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      Home Situation: lives with husband      Spiritual Beliefs: Christian      Lifestyle: doing 20 minutes of exercise and a little weight lifting daily; diet is healthy            Social Determinants of Radio broadcast assistant Strain: Not on file  Food Insecurity: Not on file  Transportation Needs: Not on file  Physical Activity: Not on file  Stress: Not on file  Social Connections: Not on file     Allergies:   Allergies  Allergen Reactions   Catfish [Fish Allergy] Shortness Of Breath   Fish-Derived Products Shortness Of Breath   Banana Hives   Citric Acid Hives   Flavoring Agent Other (See Comments)    Mouth sores Headache   Monosodium Glutamate Other (See Comments)    Headache    Pineapple Hives   Promethazine Hcl Other (See Comments)    Restless motion   Tomato Other (See Comments)   Amoxicillin Other (See Comments)    Stomach pain    Metabolic Disorder Labs: Lab Results  Component Value Date   HGBA1C 5.2 03/08/2014   No results found for: "PROLACTIN" Lab Results  Component Value Date   CHOL 166 02/09/2021   TRIG 74 02/09/2021   HDL 47 (L) 02/09/2021   CHOLHDL 3.5 02/09/2021   VLDL 11.4 03/08/2014   LDLCALC 103 (H) 02/09/2021   LDLCALC 85 02/11/2020   Lab Results  Component Value Date   TSH 1.50 02/11/2020    Therapeutic Level Labs: No results found for: "LITHIUM" No results found for: "CBMZ" No results found for: "VALPROATE"  Current Medications: Current Outpatient Medications  Medication Sig Dispense Refill   albuterol (VENTOLIN HFA) 108 (90 Base) MCG/ACT inhaler INHALE 2 PUFFS INTO THE LUNGS EVERY 4 (FOUR) HOURS AS NEEDED FOR WHEEZING. 8 g 1   amitriptyline (ELAVIL) 25 MG tablet TAKE 1 TABLET BY MOUTH EVERYDAY AT BEDTIME 90 tablet 1   azithromycin (ZITHROMAX Z-PAK) 250 MG tablet Take 2 tablets (500 mg) on  Day 1,   followed by 1 tablet (250 mg) once daily on Days 2 through 5. 6 tablet 0   baclofen (LIORESAL) 10 MG tablet Take 10 mg by mouth 3 (three) times daily.     benzonatate (TESSALON) 200 MG capsule Take 1 capsule (200 mg total) by mouth 3 (three) times daily as needed for cough. 45 capsule 0   BIOTIN PO Take by mouth.     cetirizine (ZYRTEC) 10 MG tablet Take 10 mg by mouth daily.     chlorpheniramine-HYDROcodone (TUSSIONEX) 10-8 MG/5ML Take 5 mLs by  mouth every 12 (twelve) hours as needed for cough (cough, will cause drowsiness.). 120 mL 0   cholecalciferol (VITAMIN D3) 25 MCG (1000 UNIT) tablet Take 400 mcg by mouth daily.     diclofenac sodium (VOLTAREN) 1 % GEL Apply 4 g topically 4 (four) times daily. To affected joint. 100 g 11   EMGALITY 120 MG/ML SOAJ Inject 1 mL into the skin every 30 (thirty) days.     fluticasone (FLONASE) 50 MCG/ACT nasal spray SPRAY 2 SPRAYS INTO EACH NOSTRIL EVERY DAY 16 mL 12   JUNEL 1/20 1-20 MG-MCG tablet TAKE 1 (ONE) TABLET DAILY CONTINUOUS ACTIVE PILLS 84 tablet 3   LORazepam (ATIVAN) 0.5 MG tablet TAKE 1 TABLET BY MOUTH EVERY DAY AS NEEDED FOR ANXIETY 30 tablet 1   meclizine (ANTIVERT) 25 MG tablet Take 1 tablet (25 mg total) by mouth 3 (three) times daily as needed for dizziness. 30 tablet 2   montelukast (SINGULAIR) 10 MG tablet Take 1 tablet (10 mg total) by mouth at bedtime. 30 tablet 3   omega-3 acid ethyl esters (LOVAZA) 1 g capsule Take by mouth 2 (two) times daily.     ondansetron (ZOFRAN-ODT) 4 MG disintegrating tablet Take 1 tablet (4 mg total) by mouth every 8 (eight) hours as needed for nausea or vomiting. 30 tablet 0   pantoprazole (PROTONIX) 40 MG tablet TAKE 1 TABLET (40 MG TOTAL) BY MOUTH DAILY. PT NEEDS AN APPOINTMENT 30 tablet 0   predniSONE (DELTASONE) 50 MG tablet Take 1 tablet (50 mg total) by mouth daily. 5 tablet 0   Prenatal Vit-Fe Fumarate-FA (PRENATAL MULTIVITAMIN) TABS tablet Take 1 tablet by mouth daily at 12 noon.     UBRELVY 100 MG TABS  Take by mouth.     No current facility-administered medications for this visit.     Psychiatric Specialty Exam: Review of Systems  Cardiovascular:  Negative for chest pain.  Neurological:  Negative for tremors.  Psychiatric/Behavioral:  Negative for hallucinations and self-injury.     There were no vitals taken for this visit.There is no height or weight on file to calculate BMI.  General Appearance: casual  Eye Contact:  fair  Speech:  Normal Rate  Volume:  Normal  Mood: fair  Affect:  Full Range  Thought Process:  Goal Directed  Orientation:  Full (Time, Place, and Person)  Thought Content:  Rumination  Suicidal Thoughts:  No  Homicidal Thoughts:  No  Memory:  Immediate;   Fair Recent;   Fair  Judgement:  Fair  Insight:  Fair  Psychomotor Activity:  Normal  Concentration:  Concentration: Good and Attention Span: Good  Recall:  Good  Fund of Knowledge:Good  Language: Good  Akathisia:  No  Handed:    AIMS (if indicated):  not done  Assets:  Communication Skills Desire for Improvement Financial Resources/Insurance Housing  ADL's:  Intact  Cognition: WNL  Sleep:  Fair   Screenings: GAD-7    Flowsheet Row Office Visit from 08/16/2021 in [facility] Office Visit from 11/19/2017 in [facility]  Total GAD-7 Score 0 3      PHQ2-9    [facility] Office Visit from 08/16/2021 in [facility] Visit from 02/09/2021 in [facility] Video Visit from 08/01/2020 in [facility] Office Visit from 02/08/2020 in [facility] Visit from 11/19/2017 in  [facility]  PHQ-2 Total Score 0 0 1 0 1  PHQ-9 Total Score 2 2 -- -- 6      Flowsheet Row Video Visit from 02/25/2022 in [facility]  Video Visit from 11/30/2021 in [facility] Video Visit from 08/17/2021 in [facility] No Risk No Risk No Risk       Assessment and Plan: as follows  Prior documentation reviewed   Panic attacks; sporadic or while performance, ativan helps, can continue prn Refills sent or have  anxiety unspecified: managing it with prn ativan use, continue  Insomnia: reviewed sleep hygiene, cannot sleep without ellavil, continue and reviewed side effects  Fu 3 -48m  NMerian Capron MD 10/9/20239:50 AM

## 2022-03-19 ENCOUNTER — Ambulatory Visit: Payer: Self-pay | Admitting: Family Medicine

## 2022-03-21 ENCOUNTER — Other Ambulatory Visit: Payer: Self-pay | Admitting: Medical-Surgical

## 2022-03-25 ENCOUNTER — Other Ambulatory Visit: Payer: Self-pay | Admitting: Family Medicine

## 2022-04-01 ENCOUNTER — Encounter: Payer: Self-pay | Admitting: Medical-Surgical

## 2022-04-08 ENCOUNTER — Ambulatory Visit
Admission: EM | Admit: 2022-04-08 | Discharge: 2022-04-08 | Disposition: A | Discharge status: Home / Self Care | Payer: 59 | Attending: Internal Medicine | Admitting: Internal Medicine

## 2022-04-08 ENCOUNTER — Ambulatory Visit: Admit: 2022-04-08 | Discharge status: Absent without leave | Payer: Self-pay

## 2022-04-08 DIAGNOSIS — J069 Acute upper respiratory infection, unspecified: Secondary | ICD-10-CM

## 2022-04-08 MED ORDER — BENZONATATE 100 MG PO CAPS: 100.0000 mg | ORAL_CAPSULE | Freq: Three times a day (TID) | ORAL | 0 refills | Status: DC | PRN

## 2022-04-08 NOTE — Discharge Instructions (Addendum)
Please maintain adequate hydration Please take medications as prescribed If you have chest tightness or wheezing please use your albuterol inhaler and if the symptoms does not improve please return to urgent care to be reevaluated.

## 2022-04-08 NOTE — ED Provider Notes (Signed)
[facility]    CSN: [id number] Arrival date & time: 04/08/22  1045      History   Chief Complaint Chief Complaint  Patient presents with   Cough   Nasal Congestion   Generalized Body Aches   Sore Throat    HPI Kelsey [name] is a 41 y.o. female with a history of asthma comes to urgent care with a 7-day history of generalized body aches, sore throat, nasal congestion with postnasal drainage and a nonproductive cough.  Patient's symptoms have been persistent.  Patient's son had similar symptoms.  She denies any shortness of breath or persistent wheezing.  She has a history of asthma managed with albuterol.  She denies any albuterol inhaler use recently.  No fever or chills.  No nausea, vomiting or diarrhea.  Patient is vaccinated against COVID-19 virus. 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   Arthralgia 06/09/2017   Abnormal laboratory test 09/07/2014   Asthma, mild persistent 03/08/2014   GERD (gastroesophageal reflux disease) 03/08/2014   Seasonal allergies 03/08/2014   Irritable bowel syndrome with diarrhea 03/08/2014   Anxiety and depression 03/08/2014   Chronic daily headache 12/31/2013   Vertigo 12/31/2013   Postconcussive syndrome 12/31/2013    Past Surgical History:  Procedure Laterality Date   CESAREAN SECTION N/A 07/08/2015   Procedure: CESAREAN SECTION;  Surgeon: Kelly Fogleman, MD;  Location: [facility];  Service: Obstetrics;  Laterality: N/A;    ESOPHAGOGASTRODUODENOSCOPY     [facility] 2009-2010   NO PAST SURGERIES      OB History     Gravida  1   Para  1   Term  1   Preterm      AB      Living  1      SAB      IAB      Ectopic      Multiple  0   Live Births  1            Home Medications    Prior to Admission medications   Medication Sig Start Date End Date Taking? Authorizing Provider  benzonatate (TESSALON) 100 MG capsule Take 1 capsule (100 mg total) by mouth 3 (three) times daily as needed for cough. 04/08/22  Yes Lamptey, Philip O, MD  albuterol (VENTOLIN HFA) 108 (90 Base) MCG/ACT inhaler INHALE 2 PUFFS INTO THE LUNGS EVERY 4 (FOUR) HOURS AS NEEDED FOR WHEEZING. 11/16/21   Jessup, Joy, NP  amitriptyline (ELAVIL) 25 MG tablet TAKE 1 TABLET BY MOUTH EVERYDAY AT BEDTIME 02/25/22   Akhtar, Nadeem, MD  baclofen (LIORESAL) 10 MG tablet Take 10 mg by mouth 3 (three) times daily.    [provider]  BIOTIN PO Take by mouth.    [provider]  cetirizine (ZYRTEC) 10 MG tablet Take 10 mg by mouth daily.    [provider]  chlorpheniramine-HYDROcodone (TUSSIONEX)   10-8 MG/5ML Take 5 mLs by mouth every 12 (twelve) hours as needed for cough (cough, will cause drowsiness.). 01/31/22   Thekkekandam, Thomas J, MD  cholecalciferol (VITAMIN D3) 25 MCG (1000 UNIT) tablet Take 400 mcg by mouth daily.    [provider]  diclofenac sodium (VOLTAREN) 1 % GEL Apply 4 g topically 4 (four) times daily. To affected joint. 06/09/17   Corey, Evan S, MD  EMGALITY 120 MG/ML SOAJ Inject 1 mL into the skin every 30 (thirty) days. 07/25/21   [provider]  fluticasone (FLONASE) 50 MCG/ACT nasal spray SPRAY 2 SPRAYS INTO EACH NOSTRIL EVERY DAY 12/30/18   Corey, Evan S, MD  JUNEL 1/20 1-20 MG-MCG tablet TAKE 1 (ONE) TABLET DAILY CONTINUOUS ACTIVE PILLS 02/14/22   Jessup, Joy, NP  LORazepam (ATIVAN) 0.5 MG tablet TAKE 1 TABLET BY MOUTH EVERY DAY AS NEEDED FOR ANXIETY 02/14/22   Akhtar,  Nadeem, MD  meclizine (ANTIVERT) 25 MG tablet Take 1 tablet (25 mg total) by mouth 3 (three) times daily as needed for dizziness. 08/22/21   Jessup, Joy, NP  montelukast (SINGULAIR) 10 MG tablet TAKE 1 TABLET BY MOUTH EVERYDAY AT BEDTIME 03/25/22   Jessup, Joy, NP  omega-3 acid ethyl esters (LOVAZA) 1 g capsule Take by mouth 2 (two) times daily.    [provider]  ondansetron (ZOFRAN-ODT) 4 MG disintegrating tablet Take 1 tablet (4 mg total) by mouth every 8 (eight) hours as needed for nausea or vomiting. 02/08/20   Jessup, Joy, NP  pantoprazole (PROTONIX) 40 MG tablet Take 1 tablet (40 mg total) by mouth daily. 03/21/22   Jessup, Joy, NP  predniSONE (DELTASONE) 50 MG tablet Take 1 tablet (50 mg total) by mouth daily. 01/31/22   Thekkekandam, Thomas J, MD  Prenatal Vit-Fe Fumarate-FA (PRENATAL MULTIVITAMIN) TABS tablet Take 1 tablet by mouth daily at 12 noon.    [provider]  UBRELVY 100 MG TABS Take by mouth. 11/22/21   [provider]    Family History Family History  Problem Relation Age of Onset   Hypertension Mother    Cancer Mother        multiple myeloma   Diabetes Mother    Hypertension Father    Stroke Father    Cancer Maternal Grandmother        CLL   Post-traumatic stress disorder Maternal Uncle    Crohn's disease Cousin        maternal grandfather sister daughter   Colon cancer Neg Hx    Esophageal cancer Neg Hx     Social History Social History   Tobacco Use   Smoking status: Never   Smokeless tobacco: Never  Vaping Use   Vaping Use: Never used  Substance Use Topics   Alcohol use: No    Alcohol/week: 0.0 standard drinks of alcohol   Drug use: No     Allergies   Catfish [fish allergy], Fish-derived products, Banana, Citric acid, Flavoring agent, Monosodium glutamate, Pineapple, Promethazine hcl, Tomato, and Amoxicillin   Review of Systems Review of Systems As per HPI  Physical Exam Triage Vital Signs ED Triage Vitals [04/08/22  1055]  Enc Vitals Group     BP 125/84     Pulse Rate 95     Resp 17     Temp 98.3 F (36.8 C)     Temp Source Oral     SpO2 100 %     Weight      Height        Head Circumference      Peak Flow      Pain Score 0     Pain Loc      Pain Edu?      Excl. in GC?    No data found.  Updated Vital Signs BP 125/84 (BP Location: Right Arm)   Pulse 95   Temp 98.3 F (36.8 C) (Oral)   Resp 17   SpO2 100%   Visual Acuity Right Eye Distance:   Left Eye Distance:   Bilateral Distance:    Right Eye Near:   Left Eye Near:    Bilateral Near:     Physical Exam Vitals and nursing note reviewed.  Constitutional:      Appearance: She is well-developed.  HENT:     Right Ear: Tympanic membrane normal.     Left Ear: Tympanic membrane normal.     Mouth/Throat:     Mouth: Mucous membranes are moist.     Pharynx: Posterior oropharyngeal erythema present.     Tonsils: No tonsillar exudate or tonsillar abscesses. 1+ on the right. 1+ on the left.  Eyes:     Conjunctiva/sclera: Conjunctivae normal.     Pupils: Pupils are equal, round, and reactive to light.  Cardiovascular:     Rate and Rhythm: Normal rate and regular rhythm.     Heart sounds: Normal heart sounds.  Pulmonary:     Effort: Pulmonary effort is normal.     Breath sounds: Normal breath sounds. No wheezing, rhonchi or rales.  Abdominal:     General: Bowel sounds are normal.     Palpations: Abdomen is soft.  Neurological:     Mental Status: She is alert.      UC Treatments / Results  Labs (all labs ordered are listed, but only abnormal results are displayed) Labs Reviewed - No data to display  EKG   Radiology No results found.  Procedures Procedures (including critical care time)  Medications Ordered in UC Medications - No data to display  Initial Impression / Assessment and Plan / UC Course  I have reviewed the triage vital signs and the nursing notes.  Pertinent labs & imaging results that were  available during my care of the patient were reviewed by me and considered in my medical decision making (see chart for details).     1.  Viral URI with cough: Tessalon Perles as needed for cough Tylenol as needed for pain and/or fever Adequate hydration recommended Albuterol inhaler use recommended for chest tightness, wheeze or persistent cough. Given the duration of symptoms, COVID and flu testing is not indicated Return precautions given. Final Clinical Impressions(s) / UC Diagnoses   Final diagnoses:  Viral URI with cough     Discharge Instructions      Please maintain adequate hydration Please take medications as prescribed If you have chest tightness or wheezing please use your albuterol inhaler and if the symptoms does not improve please return to urgent care to be reevaluated.   ED Prescriptions     Medication Sig Dispense Auth. Provider   benzonatate (TESSALON) 100 MG capsule Take 1 capsule (100 mg total) by mouth 3 (three) times daily as needed for cough. 21 capsule Lamptey, Philip O, MD      PDMP not reviewed this encounter.   Lamptey, Philip O, MD 04/08/22 1201  

## 2022-04-08 NOTE — ED Triage Notes (Signed)
Pt c/o cough and congestion x 1 week. Also c/o sore throat and bodyaches that started yesterday. Denies fever. Cough worsening in last day. Taking delsym, zyrtec and flonase prn.

## 2022-04-10 ENCOUNTER — Telehealth: Payer: Self-pay

## 2022-04-10 ENCOUNTER — Encounter: Payer: Self-pay | Admitting: Medical-Surgical

## 2022-04-10 ENCOUNTER — Telehealth: Payer: Self-pay | Admitting: Family Medicine

## 2022-04-10 ENCOUNTER — Inpatient Hospital Stay: Admission: RE | Admit: 2022-04-10 | Payer: 59 | Source: Ambulatory Visit

## 2022-04-10 MED ORDER — FEXOFENADINE HCL 180 MG PO TABS: 180.0000 mg | ORAL_TABLET | Freq: Every day | ORAL | 0 refills | Status: DC

## 2022-04-10 MED ORDER — AZITHROMYCIN 250 MG PO TABS: 250.0000 mg | ORAL_TABLET | Freq: Every day | ORAL | 0 refills | Status: DC

## 2022-04-10 NOTE — Telephone Encounter (Signed)
Called pt to inform of no xray this evening due to call out. VM left. Per Casimiro Needle, would send in abx and could f/u Friday if not feeling any improvement.

## 2022-04-10 NOTE — Telephone Encounter (Signed)
Pt arrived in error. Due to no xray, pt opted to not be seen. Provider sending in zpak and allegra for now. Return in next few days if no improvement for possible xray when available.

## 2022-04-10 NOTE — Telephone Encounter (Signed)
Patient advised to take Zithromax and Allegra daily for the next 5 days.  Advised may take Allegra as needed afterwards for concurrent postnasal drainage/drip.  Encouraged patient to increase daily water intake to 64 ounces per day while taking these medications.

## 2022-04-18 ENCOUNTER — Other Ambulatory Visit: Payer: Self-pay | Admitting: Medical-Surgical

## 2022-04-19 ENCOUNTER — Other Ambulatory Visit: Payer: Self-pay

## 2022-05-09 ENCOUNTER — Ambulatory Visit (INDEPENDENT_AMBULATORY_CARE_PROVIDER_SITE_OTHER): Payer: 59 | Admitting: Medical-Surgical

## 2022-05-09 ENCOUNTER — Encounter: Payer: Self-pay | Admitting: Medical-Surgical

## 2022-05-09 VITALS — BP 112/78 | HR 93 | Resp 20 | Ht 65.0 in | Wt 151.9 lb

## 2022-05-09 DIAGNOSIS — M2142 Flat foot [pes planus] (acquired), left foot: Secondary | ICD-10-CM

## 2022-05-09 DIAGNOSIS — M2141 Flat foot [pes planus] (acquired), right foot: Secondary | ICD-10-CM

## 2022-05-09 DIAGNOSIS — Z0000 Encounter for general adult medical examination without abnormal findings: Secondary | ICD-10-CM

## 2022-05-09 DIAGNOSIS — Z23 Encounter for immunization: Secondary | ICD-10-CM

## 2022-05-09 DIAGNOSIS — Z131 Encounter for screening for diabetes mellitus: Secondary | ICD-10-CM

## 2022-05-09 DIAGNOSIS — Z13220 Encounter for screening for lipoid disorders: Secondary | ICD-10-CM

## 2022-05-09 DIAGNOSIS — Z1329 Encounter for screening for other suspected endocrine disorder: Secondary | ICD-10-CM

## 2022-05-09 MED ORDER — ONDANSETRON 4 MG PO TBDP: 4.0000 mg | ORAL_TABLET | Freq: Three times a day (TID) | ORAL | 0 refills | Status: DC | PRN

## 2022-05-09 NOTE — Progress Notes (Signed)
Complete physical exam  Patient: Kelsey [name]   DOB: 12-May-1981   41 y.o. Female  MRN: [id number]  Subjective:    Chief Complaint  Patient presents with   Annual Exam    Shi Grose is a 41 y.o. female who presents today for a complete physical exam. She reports consuming a general diet. Home exercise routine includes walking. She generally feels fairly well. She reports sleeping well. She does not have additional problems to discuss today.    Most recent fall risk assessment:    08/16/2021   11:52 AM  Fall Risk   Falls in the past year? 0  Number falls in past yr: 0  Injury with Fall? 0  Risk for fall due to : No Fall Risks  Follow up Falls evaluation completed     Most recent depression screenings:    05/09/2022   10:05 AM 08/16/2021   11:51 AM  PHQ 2/9 Scores  PHQ - 2 Score 2 0  PHQ- 9 Score 4 2    Vision:Within last year, Dental: No current dental problems and Receives regular dental care, and STD: The patient denies history of sexually transmitted disease.    Patient Care Team: Samuel Bouche, NP as PCP - General (Nurse Practitioner) Aloha Gell, MD as Consulting Physician (Obstetrics and Gynecology)   Outpatient Medications Prior to Visit  Medication Sig   albuterol (VENTOLIN HFA) 108 (90 Base) MCG/ACT inhaler INHALE 2 PUFFS INTO THE LUNGS EVERY 4 (FOUR) HOURS AS NEEDED FOR WHEEZING.   amitriptyline (ELAVIL) 25 MG tablet TAKE 1 TABLET BY MOUTH EVERYDAY AT BEDTIME   BIOTIN PO Take by mouth.   cetirizine (ZYRTEC) 10 MG tablet Take 10 mg by mouth daily.   diclofenac sodium (VOLTAREN) 1 % GEL Apply 4 g topically 4 (four) times daily. To affected joint.   EMGALITY 120 MG/ML SOAJ Inject 1 mL into the skin every 30 (thirty) days.   fluticasone (FLONASE) 50 MCG/ACT nasal spray SPRAY 2 SPRAYS INTO EACH NOSTRIL EVERY DAY   JUNEL 1/20 1-20 MG-MCG tablet TAKE 1 (ONE) TABLET DAILY CONTINUOUS ACTIVE PILLS   LORazepam (ATIVAN) 0.5 MG tablet TAKE 1 TABLET BY MOUTH  EVERY DAY AS NEEDED FOR ANXIETY   meclizine (ANTIVERT) 25 MG tablet Take 1 tablet (25 mg total) by mouth 3 (three) times daily as needed for dizziness.   montelukast (SINGULAIR) 10 MG tablet TAKE 1 TABLET BY MOUTH EVERYDAY AT BEDTIME   omega-3 acid ethyl esters (LOVAZA) 1 g capsule Take by mouth 2 (two) times daily.   pantoprazole (PROTONIX) 40 MG tablet TAKE 1 TABLET BY MOUTH EVERY DAY   Prenatal Vit-Fe Fumarate-FA (PRENATAL MULTIVITAMIN) TABS tablet Take 1 tablet by mouth daily at 12 noon.   UBRELVY 100 MG TABS Take by mouth.   [DISCONTINUED] ondansetron (ZOFRAN-ODT) 4 MG disintegrating tablet Take 1 tablet (4 mg total) by mouth every 8 (eight) hours as needed for nausea or vomiting.   [DISCONTINUED] azithromycin (ZITHROMAX) 250 MG tablet Take 1 tablet (250 mg total) by mouth daily. Take first 2 tablets together, then 1 every day until finished.   [DISCONTINUED] baclofen (LIORESAL) 10 MG tablet Take 10 mg by mouth 3 (three) times daily.   [DISCONTINUED] benzonatate (TESSALON) 100 MG capsule Take 1 capsule (100 mg total) by mouth 3 (three) times daily as needed for cough.   [DISCONTINUED] chlorpheniramine-HYDROcodone (TUSSIONEX) 10-8 MG/5ML Take 5 mLs by mouth every 12 (twelve) hours as needed for cough (cough, will cause drowsiness.).   [DISCONTINUED] cholecalciferol (VITAMIN  D3) 25 MCG (1000 UNIT) tablet Take 400 mcg by mouth daily.   [DISCONTINUED] fexofenadine (ALLEGRA ALLERGY) 180 MG tablet Take 1 tablet (180 mg total) by mouth daily for 15 days.   [DISCONTINUED] predniSONE (DELTASONE) 50 MG tablet Take 1 tablet (50 mg total) by mouth daily.   No facility-administered medications prior to visit.    Review of Systems  Constitutional:  Negative for chills, fever, malaise/fatigue and weight loss.  HENT:  Negative for congestion, ear pain, hearing loss, sinus pain and sore throat.   Eyes:  Negative for blurred vision, photophobia and pain.  Respiratory:  Negative for cough, shortness of  breath and wheezing.   Cardiovascular:  Negative for chest pain, palpitations and leg swelling.  Gastrointestinal:  Positive for nausea. Negative for abdominal pain, constipation, diarrhea, heartburn and vomiting.  Genitourinary:  Negative for dysuria, frequency and urgency.       Breakthrough menstrual bleeding/spotting  Musculoskeletal:  Positive for joint pain (knees, ankles, feet). Negative for falls and neck pain.  Skin:  Negative for itching and rash.       Right ear piercing swelling/tenderness.  Neurological:  Positive for dizziness and headaches. Negative for weakness.  Endo/Heme/Allergies:  Negative for polydipsia. Does not bruise/bleed easily.  Psychiatric/Behavioral:  Positive for depression. Negative for substance abuse and suicidal ideas. The patient is nervous/anxious. The patient does not have insomnia.      Objective:    BP 112/78 (BP Location: Left Arm, Cuff Size: Normal)   Pulse 93   Resp 20   Ht _0  (1.651 m)   Wt 151 lb 14.4 oz (68.9 kg)   SpO2 100%   BMI 25.28 kg/m    Physical Exam Constitutional:      General: She is not in acute distress.    Appearance: Normal appearance. She is not ill-appearing.  HENT:     Head: Normocephalic and atraumatic.     Right Ear: Tympanic membrane, ear canal and external ear normal. There is no impacted cerumen.     Left Ear: Tympanic membrane, ear canal and external ear normal. There is no impacted cerumen.     Nose: Nose normal. No congestion or rhinorrhea.     Mouth/Throat:     Mouth: Mucous membranes are moist.     Pharynx: No oropharyngeal exudate or posterior oropharyngeal erythema.  Eyes:     General: No scleral icterus.       Right eye: No discharge.        Left eye: No discharge.     Extraocular Movements: Extraocular movements intact.     Conjunctiva/sclera: Conjunctivae normal.     Pupils: Pupils are equal, round, and reactive to light.  Neck:     Thyroid: No thyromegaly.     Vascular: No carotid bruit or  JVD.     Trachea: Trachea normal.  Cardiovascular:     Rate and Rhythm: Normal rate and regular rhythm.     Pulses: Normal pulses.     Heart sounds: Normal heart sounds. No murmur heard.    No friction rub. No gallop.  Pulmonary:     Effort: Pulmonary effort is normal. No respiratory distress.     Breath sounds: Normal breath sounds. No wheezing.  Abdominal:     General: Bowel sounds are normal. There is no distension.     Palpations: Abdomen is soft.     Tenderness: There is no abdominal tenderness. There is no guarding.  Musculoskeletal:        General:  Normal range of motion.     Cervical back: Normal range of motion and neck supple.  Lymphadenopathy:     Cervical: No cervical adenopathy.  Skin:    General: Skin is warm and dry.  Neurological:     Mental Status: She is alert and oriented to person, place, and time.     Cranial Nerves: No cranial nerve deficit.  Psychiatric:        Mood and Affect: Mood normal.        Behavior: Behavior normal.        Thought Content: Thought content normal.        Judgment: Judgment normal.      No results found for any visits on 05/09/22.     Assessment & Plan:    Routine Health Maintenance and Physical Exam  Immunization History  Administered Date(s) Administered   Hepatitis A 04/23/2006, 04/02/2007   Hepatitis B, adult 10/26/2010, 08/20/2011   IPV 04/02/2007   Influenza,inj,Quad PF,6+ Mos 03/08/2014, 01/14/2019, 02/08/2020, 02/09/2021, 05/09/2022   Influenza-Unspecified 02/17/2017   MMR 03/03/1984, 07/20/1996   Meningococcal Polysaccharide 11/17/2000   Moderna Sars-Covid-2 Vaccination 09/10/2019, 10/08/2019   OPV 01/16/1988   PPD Test 11/19/2000   Td 08/22/2003   Tdap 03/08/2014, 09/25/2019   Typhoid Parenteral 04/23/2006    Health Maintenance  Topic Date Due   COVID-19 Vaccine (3 - 2023-24 season) 05/25/2022 (Originally 01/18/2022)   PAP SMEAR-Modifier  02/09/2026   DTaP/Tdap/Td (4 - Td or Tdap) 09/24/2029   INFLUENZA  VACCINE  Completed   Hepatitis C Screening  Completed   HIV Screening  Completed   HPV VACCINES  Aged Out    Discussed health benefits of physical activity, and encouraged her to engage in regular exercise appropriate for her age and condition.  1. Annual physical exam Deferred labs today. Had recent blood work and will send [place] those results for upload into her chart. UTD on preventative care. Wellness information provided with AVS.   2. Lipid screening Deferring lipid panel.   3. Diabetes mellitus screening Declined today.  4. Thyroid disorder screen Declined today.   5. Flat feet, bilateral Referring to podiatry. Feel that her bilateral flat feet are contributing to her knee pain due to abnormal gait.  - Ambulatory referral to Podiatry  6. Need for influenza vaccination Flu vaccine in office today.  - Flu Vaccine QUAD 6+ mos PF IM (Fluarix Quad PF)   Return in about 1 year (around 05/10/2023) for annual physical exam; NV for COVID vaccine at your convenience.     Samuel Bouche, NP

## 2022-05-12 ENCOUNTER — Other Ambulatory Visit: Payer: Self-pay | Admitting: Medical-Surgical

## 2022-05-14 ENCOUNTER — Ambulatory Visit (INDEPENDENT_AMBULATORY_CARE_PROVIDER_SITE_OTHER): Payer: 59 | Admitting: Medical-Surgical

## 2022-05-14 VITALS — Temp 97.0°F | Ht 65.0 in | Wt 151.9 lb

## 2022-05-14 DIAGNOSIS — Z23 Encounter for immunization: Secondary | ICD-10-CM | POA: Diagnosis not present

## 2022-05-14 NOTE — Progress Notes (Signed)
Agree with documentation as below.  ___________________________________________ Geramy Lamorte L. Marquasia Schmieder, DNP, APRN, FNP-BC Primary Care and Sports Medicine [facility]  

## 2022-05-14 NOTE — Progress Notes (Signed)
Pt presents to day for immunization. No allergy to eggs or latex.   Location: LD  Pt tolerated well.   

## 2022-05-23 ENCOUNTER — Ambulatory Visit (INDEPENDENT_AMBULATORY_CARE_PROVIDER_SITE_OTHER): Payer: 59

## 2022-05-23 ENCOUNTER — Encounter: Payer: Self-pay | Admitting: Podiatry

## 2022-05-23 ENCOUNTER — Ambulatory Visit (INDEPENDENT_AMBULATORY_CARE_PROVIDER_SITE_OTHER): Payer: 59 | Admitting: Podiatry

## 2022-05-23 DIAGNOSIS — M79672 Pain in left foot: Secondary | ICD-10-CM | POA: Diagnosis not present

## 2022-05-23 DIAGNOSIS — M2142 Flat foot [pes planus] (acquired), left foot: Secondary | ICD-10-CM | POA: Diagnosis not present

## 2022-05-23 DIAGNOSIS — M79671 Pain in right foot: Secondary | ICD-10-CM

## 2022-05-23 DIAGNOSIS — M25561 Pain in right knee: Secondary | ICD-10-CM

## 2022-05-23 DIAGNOSIS — M25562 Pain in left knee: Secondary | ICD-10-CM

## 2022-05-23 DIAGNOSIS — M2141 Flat foot [pes planus] (acquired), right foot: Secondary | ICD-10-CM

## 2022-05-23 NOTE — Progress Notes (Signed)
  Subjective:  Patient ID: Kelsey [name], female    DOB: 10/22/1980,   MRN: [id number]  Chief Complaint  Patient presents with   Flat Foot    Bilateral flat foot  causing ankle pain     42 y.o. female presents for concern of bilateral flat feet that have been present all her life. Relates she does not have too much pain in her feet but does have pain in knees and swelling in ankles. States she has tried to wear support and inserts but still concerned her flat feet are contributing to her knee pain.  Denies any other pedal complaints. Denies n/v/f/c.   Past Medical History:  Diagnosis Date   Allergy    Anxiety and depression    Asthma    Brain injury ([place]) 2014   Clotting disorder ([place]) 02/17/2022   Depression    Endometriosis    Frequent headaches    GERD (gastroesophageal reflux disease)    Hx of varicella    IBS (irritable bowel syndrome)    Migraines    Panic attacks    Postpartum care following cesarean delivery (2/18) 07/08/2015   Seizure ([place])    Vertigo 12/31/2013    Objective:  Physical Exam: Vascular: DP/PT pulses 2/4 bilateral. CFT <3 seconds. Normal hair growth on digits. No edema.  Skin. No lacerations or abrasions bilateral feet. Mild edema noted around bilateral ankles.  Musculoskeletal: MMT 5/5 bilateral lower extremities in DF, PF, Inversion and Eversion. Deceased ROM in DF of ankle joint. Pes planus noted bilateral. No pain to palpation about the feet. Increased eversion of STJ. Some limit in ROM of stj on left.  Neurological: Sensation intact to light touch.   Assessment:   1. Bilateral pes planus      Plan:  Patient was evaluated and treated and all questions answered. X-rays reviewed and discussed with patient. No acute fractures or dislocations noted. Decreased in plantar arch height and increase in talar head uncoverage.  -Xrays reviewed -Discussed treatement options; discussed pes planus deformity;conservative and  surgical  -Powersteps  dispensed.  -Recommend good supportive shoes -Recommend daily stretching and icing -Discussed if knee pain continues to follow-up with specialist.  -Patient to return to office as needed or sooner if condition worsens.   Lorenda Peck, DPM

## 2022-06-29 ENCOUNTER — Ambulatory Visit
Admission: RE | Admit: 2022-06-29 | Discharge: 2022-06-29 | Disposition: A | Discharge status: Home / Self Care | Payer: 59 | Source: Ambulatory Visit | Attending: Urgent Care | Admitting: Urgent Care

## 2022-06-29 ENCOUNTER — Other Ambulatory Visit: Payer: Self-pay

## 2022-06-29 VITALS — BP 97/66 | HR 98 | Temp 97.9°F | Resp 20 | Ht 65.0 in | Wt 147.0 lb

## 2022-06-29 DIAGNOSIS — R000 Tachycardia, unspecified: Secondary | ICD-10-CM | POA: Insufficient documentation

## 2022-06-29 DIAGNOSIS — U071 COVID-19: Secondary | ICD-10-CM | POA: Diagnosis not present

## 2022-06-29 DIAGNOSIS — R519 Headache, unspecified: Secondary | ICD-10-CM | POA: Diagnosis not present

## 2022-06-29 LAB — POC SARS CORONAVIRUS 2 AG -  ED: SARS Coronavirus 2 Ag: POSITIVE — AB

## 2022-06-29 MED ORDER — DEXAMETHASONE 6 MG PO TABS: 6.0000 mg | ORAL_TABLET | Freq: Every day | ORAL | 0 refills | Status: AC

## 2022-06-29 NOTE — ED Triage Notes (Signed)
Pt presents to Urgent Care with c/o coughing, fatigue, headaches, and nasal congestion x 8 days. Reports taking Tamiflu d/t son testing positive for the flu. Now states headache is severe today--L side.

## 2022-06-29 NOTE — Discharge Instructions (Addendum)
As discussed, you are positive for COVID-19 but past the treatment window. Start taking dexamethasone today. Use backup contraception while on the steroid as this can make your birth control less effective. Take it with food and in the morning to prevent insomnia at night. You may take OTC delsym at night if your cough is keeping you awake. If you develop severe shortness of breath or chest pain, please head to ER. Wear an N95 mask through Friday of next week

## 2022-06-29 NOTE — ED Provider Notes (Signed)
[name]    CSN: [id number] Arrival date & time: 06/29/22  1158      History   Chief Complaint Chief Complaint  Patient presents with   Headache   Cough   Nasal Congestion    HPI Kelsey [name] is a 42 y.o. female.   Pleasant 42yo female presents today with headache, cough and congestion. Pt states she has felt under the weather all week. Symptoms originally started 06/22/22. Pt's son is in 1st grade and had the flu last week. Pt took tamiflu twice daily prophylactically, just completed it. States she never had fever or body aches, but had nasal congestion and dry cough. On Wednesday, states she had excessive sneezing and nasal drainage. Last evening however, pt started developing a L sided headache. States she woke up early this morning with even more severe symptoms. L headache behind eye, causing pressure into her ear and neck. Denies fever or chills. Does not hurt to touch her maxillary or frontal sinus. Still having dry cough. Pt admits to hx of migraines in the past, but states this feels different. Denies blurred vision, photophobia or scotomas. No fuzzy lines in vision. No stiffness of neck. Took 429m ibuprofen this morning around 7am and felt that it worked well until recently when it wore off.    Headache Associated symptoms: cough   Cough Associated symptoms: headaches     Past Medical History:  Diagnosis Date   Allergy    Anxiety and depression    Asthma    [place]   [place] 02/17/20[place]   Depression    Endometriosis    Frequent headaches    GERD (gastroesophageal reflux disease)    Hx of varicella    IBS (irritable bowel syndrome)    Migraines    Panic attacks    Postpartum care following cesarean delivery (2/18) 07/08/2015   [place]    Vertigo 12/31/2013    Patient Active Problem List   Diagnosis Date Noted   Encounter for female birth control 06/01/2019   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   Arthralgia  06/09/2017   Abnormal laboratory test 09/07/2014   Asthma, mild persistent 03/08/2014   GERD (gastroesophageal reflux disease) 03/08/2014   Seasonal allergies 03/08/2014   Irritable bowel syndrome with diarrhea 03/08/2014   Anxiety and depression 03/08/2014   Chronic daily headache 12/31/2013   Vertigo 12/31/2013   Postconcussive syndrome 12/31/2013    Past Surgical History:  Procedure Laterality Date   CESAREAN SECTION N/A 07/08/2015   Procedure: CESAREAN SECTION;  Surgeon: KAloha Gell MD;  [facility];  Service: Obstetrics;  Laterality: N/A;   ESOPHAGOGASTRODUODENOSCOPY     [facility] [id number]   NO PAST SURGERIES      OB History     Gravida  1   Para  1   Term  1   Preterm      AB      Living  1      SAB      IAB      Ectopic      Multiple  0   Live Births  1            Home Medications    Prior to Admission medications   Medication Sig Start Date End Date Taking? Authorizing Provider  dexamethasone (DECADRON) 6 MG tablet Take 1 tablet (6 mg total) by mouth daily for 6 days. 06/29/22 07/05/22 Yes Amaka Gluth L, PA  albuterol (VENTOLIN  HFA) 108 (90 Base) MCG/ACT inhaler INHALE 2 PUFFS INTO THE LUNGS EVERY 4 (FOUR) HOURS AS NEEDED FOR WHEEZING. 11/16/21   Samuel Bouche, NP  amitriptyline (ELAVIL) 25 MG tablet TAKE 1 TABLET BY MOUTH EVERYDAY AT BEDTIME 02/25/22   Merian Capron, MD  BIOTIN PO Take by mouth.    [provider]  cetirizine (ZYRTEC) 10 MG tablet Take 10 mg by mouth daily.    [provider]  diclofenac sodium (VOLTAREN) 1 % GEL Apply 4 g topically 4 (four) times daily. To affected joint. 06/09/17   Gregor Hams, MD  EMGALITY 120 MG/ML SOAJ Inject 1 mL into the skin every 30 (thirty) days. 07/25/21   [provider]  fluticasone (FLONASE) 50 MCG/ACT nasal spray SPRAY 2 SPRAYS INTO EACH NOSTRIL EVERY DAY 12/30/18   Gregor Hams, MD  JUNEL 1/20 1-20 MG-MCG tablet TAKE 1 (ONE) TABLET DAILY CONTINUOUS  ACTIVE PILLS 02/14/22   Samuel Bouche, NP  LORazepam (ATIVAN) 0.5 MG tablet TAKE 1 TABLET BY MOUTH EVERY DAY AS NEEDED FOR ANXIETY 02/14/22   Merian Capron, MD  meclizine (ANTIVERT) 25 MG tablet Take 1 tablet (25 mg total) by mouth 3 (three) times daily as needed for dizziness. 08/22/21   Samuel Bouche, NP  montelukast (SINGULAIR) 10 MG tablet TAKE 1 TABLET BY MOUTH EVERYDAY AT BEDTIME 03/25/22   Samuel Bouche, NP  omega-3 acid ethyl esters (LOVAZA) 1 g capsule Take by mouth 2 (two) times daily.    [provider]  ondansetron (ZOFRAN-ODT) 4 MG disintegrating tablet Take 1 tablet (4 mg total) by mouth every 8 (eight) hours as needed for nausea or vomiting. 05/09/22   Samuel Bouche, NP  pantoprazole (PROTONIX) 40 MG tablet TAKE 1 TABLET BY MOUTH EVERY DAY 05/14/22   Samuel Bouche, NP  Prenatal Vit-Fe Fumarate-FA (PRENATAL MULTIVITAMIN) TABS tablet Take 1 tablet by mouth daily at 12 noon.    [provider]  UBRELVY 100 MG TABS Take by mouth. 11/22/21   [provider]    Family History Family History  Problem Relation Age of Onset   Hypertension Mother    Cancer Mother        multiple myeloma   Diabetes Mother    Obesity Mother    Hypertension Father    Stroke Father    Cancer Maternal Grandmother        CLL   Hypertension Maternal Grandmother    Post-traumatic stress disorder Maternal Uncle    Crohn's disease Cousin        maternal grandfather sister daughter   Colon cancer Neg Hx    Esophageal cancer Neg Hx     Social History Social History   Tobacco Use   Smoking status: Never   Smokeless tobacco: Never  Vaping Use   Vaping Use: Never used  Substance Use Topics   Alcohol use: No   Drug use: No     Allergies   Catfish [fish allergy], Fish-derived products, Banana, Citric acid, Flavoring agent, Monosodium glutamate, Pineapple, Promethazine hcl, Tomato, and Amoxicillin   Review of Systems Review of Systems  Respiratory:  Positive for cough.    Neurological:  Positive for headaches.  As per HPI   Physical Exam Triage Vital Signs ED Triage Vitals  Enc Vitals Group     BP 06/29/22 1215 94/65     Pulse Rate 06/29/22 1215 98     Resp 06/29/22 1215 20     Temp 06/29/22 1215 97.9 F (36.6 C)  Temp Source 06/29/22 1215 Oral     SpO2 06/29/22 1215 98 %     Weight 06/29/22 1212 147 lb (66.7 kg)     Height 06/29/22 1212 5' 5"$  (1.651 m)     Head Circumference --      Peak Flow --      Pain Score 06/29/22 1212 6     Pain Loc --      Pain Edu? --      Excl. in [place]? --    No data found.  Updated Vital Signs BP 97/66 (BP Location: Left Arm)   Pulse 98   Temp 97.9 F (36.6 C) (Oral)   Resp 20   Ht 5' 5"$  (1.651 m)   Wt 147 lb (66.7 kg)   LMP  (LMP Unknown) Comment: takes OC continuoously  SpO2 98%   BMI 24.46 kg/m   Visual Acuity Right Eye Distance:   Left Eye Distance:   Bilateral Distance:    Right Eye Near:   Left Eye Near:    Bilateral Near:     Physical Exam Vitals and nursing note reviewed.  Constitutional:      General: She is not in acute distress.    Appearance: She is well-developed and normal weight. She is not ill-appearing, toxic-appearing or diaphoretic.  HENT:     Head: Normocephalic and atraumatic.     Jaw: There is normal jaw occlusion. No trismus or tenderness.     Salivary Glands: Right salivary gland is not diffusely enlarged or tender. Left salivary gland is not diffusely enlarged or tender.     Right Ear: Tympanic membrane, ear canal and external ear normal. No middle ear effusion. There is no impacted cerumen. Tympanic membrane is not injected or erythematous.     Left Ear: Tympanic membrane, ear canal and external ear normal.  No middle ear effusion. There is no impacted cerumen. Tympanic membrane is not injected or erythematous.     Nose: Nose normal. No congestion or rhinorrhea.     Right Turbinates: Not enlarged or swollen.     Left Turbinates: Not enlarged or swollen.     Right  Sinus: No maxillary sinus tenderness or frontal sinus tenderness.     Left Sinus: No maxillary sinus tenderness or frontal sinus tenderness.     Mouth/Throat:     Mouth: Mucous membranes are moist.     Pharynx: Oropharynx is clear. No oropharyngeal exudate or posterior oropharyngeal erythema.  Eyes:     General: No scleral icterus.       Right eye: No discharge.        Left eye: No discharge.     Extraocular Movements: Extraocular movements intact.     Conjunctiva/sclera: Conjunctivae normal.     Pupils: Pupils are equal, round, and reactive to light.  Cardiovascular:     Rate and Rhythm: Normal rate and regular rhythm.     Heart sounds: No murmur heard. Pulmonary:     Effort: Pulmonary effort is normal. No accessory muscle usage, respiratory distress or retractions.     Breath sounds: Normal breath sounds and air entry. No stridor, decreased air movement or transmitted upper airway sounds. No decreased breath sounds, wheezing, rhonchi or rales.  Chest:     Chest wall: No tenderness.  Musculoskeletal:        General: No swelling.     Cervical back: Normal range of motion and neck supple. No rigidity or tenderness.  Lymphadenopathy:     Cervical: No cervical  adenopathy.  Skin:    General: Skin is warm and dry.     Capillary Refill: Capillary refill takes less than 2 seconds.     Findings: No bruising, erythema or rash.  Neurological:     General: No focal deficit present.     Mental Status: She is alert and oriented to person, place, and time.  Psychiatric:        Mood and Affect: Mood normal.      UC Treatments / Results  Labs (all labs ordered are listed, but only abnormal results are displayed) Labs Reviewed  POC SARS CORONAVIRUS 2 AG -  ED - Abnormal; Notable for the following components:      Result Value   SARS Coronavirus 2 Ag Positive (*)    All other components within normal limits    EKG   Radiology No results found.  Procedures Procedures (including  critical care time)  Medications Ordered in UC Medications - No data to display  Initial Impression / Assessment and Plan / UC Course  I have reviewed the triage vital signs and the nursing notes.  Pertinent labs & imaging results that were available during my care of the patient were reviewed by me and considered in my medical decision making (see chart for details).     Covid-19 - pt has never had covid in the past. Pt's sx started on 2/3 therefore is past the tx window. Due to her worsening sinus sx in the absence of bacterial sinusitis, will do dexamethasone 72m daily x 6 days. Additional OTC meds discussed. Wear N95 mask. Sinus headache - sinus rinses and flonase OTC. Tx as above.    Final Clinical Impressions(s) / UC Diagnoses   Final diagnoses:  COVID-19  Sinus headache     Discharge Instructions      As discussed, you are positive for COVID-19 but past the treatment window. Start taking dexamethasone today. Use backup contraception while on the steroid as this can make your birth control less effective. Take it with food and in the morning to prevent insomnia at night. You may take OTC delsym at night if your cough is keeping you awake. If you develop severe shortness of breath or chest pain, please head to ER. Wear an N95 mask through Friday of next week     ED Prescriptions     Medication Sig Dispense Auth. Provider   dexamethasone (DECADRON) 6 MG tablet Take 1 tablet (6 mg total) by mouth daily for 6 days. 6 tablet Rhiannan Kievit L, PU[place]     PDMP not reviewed this encounter.   CChaney Malling PUtah02/10/24 [id number]

## 2022-07-01 ENCOUNTER — Encounter (HOSPITAL_COMMUNITY): Payer: Self-pay | Admitting: Psychiatry

## 2022-07-01 ENCOUNTER — Telehealth (INDEPENDENT_AMBULATORY_CARE_PROVIDER_SITE_OTHER): Payer: 59 | Admitting: Psychiatry

## 2022-07-01 DIAGNOSIS — F419 Anxiety disorder, unspecified: Secondary | ICD-10-CM | POA: Diagnosis not present

## 2022-07-01 DIAGNOSIS — F5102 Adjustment insomnia: Secondary | ICD-10-CM

## 2022-07-01 DIAGNOSIS — F410 Panic disorder [episodic paroxysmal anxiety] without agoraphobia: Secondary | ICD-10-CM | POA: Diagnosis not present

## 2022-07-01 MED ORDER — AMITRIPTYLINE HCL 25 MG PO TABS: ORAL_TABLET | ORAL | 0 refills | Status: DC

## 2022-07-01 MED ORDER — LORAZEPAM 0.5 MG PO TABS: ORAL_TABLET | ORAL | 0 refills | Status: DC

## 2022-07-01 NOTE — Progress Notes (Signed)
[place] Follow up visit  Patient Identification: Kelsey [name] MRN:  [id number] Date of Evaluation:  07/01/2022 Referral Source: GI  Chief Complaint:  follow up sleep, anxiety Visit Diagnosis:    ICD-10-CM   1. Anxiety disorder, unspecified type  F41.9     2. Panic attacks  F41.0     3. Adjustment insomnia  F51.02      Virtual Visit via Video Note  I connected with Kelsey [name] on 07/01/22 at 10:00 AM EST by a video enabled telemedicine application and verified that I am speaking with the correct person using two identifiers.  Location: Patient: home Provider: home office   I discussed the limitations of evaluation and management by telemedicine and the availability of in person appointments. The patient expressed understanding and agreed to proceed.      I discussed the assessment and treatment plan with the patient. The patient was provided an opportunity to ask questions and all were answered. The patient agreed with the plan and demonstrated an understanding of the instructions.   The patient was advised to call back or seek an in-person evaluation if the symptoms worsen or if the condition fails to improve as anticipated.  I provided 20  minutes of non-face-to-face time during this encounter.       History of Present Illness: Patient is a 42 years old currently married African-American female referred by GI doctor for establishment of care for possible anxiety.  Patient has been suffering from irritable bowel syndrome She also has a head injury in the past leading to migraines and now is currently on disability since 2015  Just recovering from covid, doing fair Sleeps ok with ellavil Husband has OCD that can be bothersome but they are in couples therapy  Takes ativan for lecture or performance, it helps No side effects Aggravating  factor: webinar presentations past head injury, relationship can be Modifying factor: family Severity manageable  Duration more  so last few years   Past Psychiatric History: anxiety  Consequences of Substance Abuse: NA  Past Medical History:  Past Medical History:  Diagnosis Date   Allergy    Anxiety and depression    Asthma    Brain injury ([place]) 2014   Clotting disorder ([place]) 02/17/2022   Depression    Endometriosis    Frequent headaches    GERD (gastroesophageal reflux disease)    Hx of varicella    IBS (irritable bowel syndrome)    Migraines    Panic attacks    Postpartum care following cesarean delivery (2/18) 07/08/2015   Seizure ([place])    Vertigo 12/31/2013    Past Surgical History:  Procedure Laterality Date   CESAREAN SECTION N/A 07/08/2015   Procedure: CESAREAN SECTION;  Surgeon: Aloha Gell, MD;  Location: [facility];  Service: Obstetrics;  Laterality: N/A;   ESOPHAGOGASTRODUODENOSCOPY     [facility] 2009-2010   NO PAST SURGERIES      Family Psychiatric History: Mom: OCD possibel  Family History:  Family History  Problem Relation Age of Onset   Hypertension Mother    Cancer Mother        multiple myeloma   Diabetes Mother    Obesity Mother    Hypertension Father    Stroke Father    Cancer Maternal Grandmother        CLL   Hypertension Maternal Grandmother    Post-traumatic stress disorder Maternal Uncle    Crohn's disease Cousin        maternal grandfather  sister daughter   Colon cancer Neg Hx    Esophageal cancer Neg Hx     Social History:   Social History   Socioeconomic History   Marital status: Married    Spouse name: Not on file   Number of children: 0   Years of education: Not on file   Highest education level: Not on file  Occupational History   Not on file  Tobacco Use   Smoking status: Never   Smokeless tobacco: Never  Vaping Use   Vaping Use: Never used  Substance and Sexual Activity   Alcohol use: No   Drug use: No   Sexual activity: Not on file  Other Topics Concern   Not on file  Social History Narrative   Work or School: Surveyor, 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      Home Situation: lives with husband      Spiritual Beliefs: Christian      Lifestyle: doing 20 minutes of exercise and a little weight lifting daily; diet is healthy            Social Determinants of Radio broadcast assistant Strain: Not on file  Food Insecurity: Not on file  Transportation Needs: Not on file  Physical Activity: Not on file  Stress: Not on file  Social Connections: Not on file     Allergies:   Allergies  Allergen Reactions   Catfish [Fish Allergy] Shortness Of Breath   Fish-Derived Products Shortness Of Breath   Banana Hives   Citric Acid Hives   Flavoring Agent Other (See Comments)    Mouth sores Headache   Monosodium Glutamate Other (See Comments)    Headache    Pineapple Hives   Promethazine Hcl Other (See Comments)    Restless motion   Tomato Other (See Comments)   Amoxicillin Other (See Comments)    Stomach pain    Metabolic Disorder Labs: Lab Results  Component Value Date   HGBA1C 5.2 03/08/2014   No results found for: "PROLACTIN" Lab Results  Component Value Date   CHOL 166 02/09/2021   TRIG 74 02/09/2021   HDL 47 (L) 02/09/2021   CHOLHDL 3.5 02/09/2021   VLDL 11.4 03/08/2014   LDLCALC 103 (H) 02/09/2021   LDLCALC 85 02/11/2020   Lab Results  Component Value Date   TSH 1.50 02/11/2020    Therapeutic Level Labs: No results found for: "LITHIUM" No results found for: "CBMZ" No results found for: "VALPROATE"  Current Medications: Current Outpatient Medications  Medication Sig Dispense Refill   albuterol (VENTOLIN HFA) 108 (90 Base) MCG/ACT inhaler INHALE 2 PUFFS INTO THE LUNGS EVERY 4 (FOUR) HOURS AS NEEDED FOR WHEEZING. 8 g 1   amitriptyline (ELAVIL) 25 MG tablet TAKE 1 TABLET BY MOUTH EVERYDAY AT BEDTIME 90 tablet 0   BIOTIN PO Take by mouth.     cetirizine (ZYRTEC) 10 MG tablet Take 10 mg by mouth daily.     dexamethasone (DECADRON) 6 MG tablet Take 1 tablet (6 mg total) by mouth daily for  6 days. 6 tablet 0   diclofenac sodium (VOLTAREN) 1 % GEL Apply 4 g topically 4 (four) times daily. To affected joint. 100 g 11   EMGALITY 120 MG/ML SOAJ Inject 1 mL into the skin every 30 (thirty) days.     fluticasone (FLONASE) 50 MCG/ACT nasal spray SPRAY 2 SPRAYS INTO EACH NOSTRIL EVERY DAY 16 mL 12   JUNEL 1/20 1-20 MG-MCG tablet TAKE 1 (ONE) TABLET DAILY CONTINUOUS ACTIVE  PILLS 84 tablet 3   LORazepam (ATIVAN) 0.5 MG tablet TAKE 1 TABLET BY MOUTH EVERY DAY AS NEEDED FOR ANXIETY 30 tablet 0   meclizine (ANTIVERT) 25 MG tablet Take 1 tablet (25 mg total) by mouth 3 (three) times daily as needed for dizziness. 30 tablet 2   montelukast (SINGULAIR) 10 MG tablet TAKE 1 TABLET BY MOUTH EVERYDAY AT BEDTIME 30 tablet 5   omega-3 acid ethyl esters (LOVAZA) 1 g capsule Take by mouth 2 (two) times daily.     ondansetron (ZOFRAN-ODT) 4 MG disintegrating tablet Take 1 tablet (4 mg total) by mouth every 8 (eight) hours as needed for nausea or vomiting. 30 tablet 0   pantoprazole (PROTONIX) 40 MG tablet TAKE 1 TABLET BY MOUTH EVERY DAY 90 tablet 1   Prenatal Vit-Fe Fumarate-FA (PRENATAL MULTIVITAMIN) TABS tablet Take 1 tablet by mouth daily at 12 noon.     UBRELVY 100 MG TABS Take by mouth.     No current facility-administered medications for this visit.     Psychiatric Specialty Exam: Review of Systems  Cardiovascular:  Negative for chest pain.  Neurological:  Negative for tremors.  Psychiatric/Behavioral:  Negative for hallucinations and self-injury.     There were no vitals taken for this visit.There is no height or weight on file to calculate BMI.  General Appearance: casual  Eye Contact:  fair  Speech:  Normal Rate  Volume:  Normal  Mood: fair  Affect:  Full Range  Thought Process:  Goal Directed  Orientation:  Full (Time, Place, and Person)  Thought Content:  Rumination  Suicidal Thoughts:  No  Homicidal Thoughts:  No  Memory:  Immediate;   Fair Recent;   Fair  Judgement:  Fair   Insight:  Fair  Psychomotor Activity:  Normal  Concentration:  Concentration: Good and Attention Span: Good  Recall:  Good  Fund of Knowledge:Good  Language: Good  Akathisia:  No  Handed:    AIMS (if indicated):  not done  Assets:  Communication Skills Desire for Improvement Financial Resources/Insurance Housing  ADL's:  Intact  Cognition: WNL  Sleep:  Fair   Screenings: GAD-7    [place] Office Visit from 05/09/2022 in [place] at [place] Visit from 08/16/2021 in [place] at [place] Visit from 11/19/2017 in [place] at [facility]  Total GAD-7 Score 0 0 3      PHQ2-9    [place] Office Visit from 05/09/2022 in [place] at [place] Visit from 08/16/2021 in [place] at [place] Visit from 02/09/2021 in [place] at [facility] Video Visit from 08/01/2020 in [place] at [place] Visit from 02/08/2020 in [place] at [facility]  PHQ-2 Total Score 2 0 0 1 0  PHQ-9 Total Score 4 2 2 $ -- --      Flowsheet Row ED from 06/29/2022 in [facility] at [facility] ED from 04/08/2022 in [facility] at [facility] Video Visit from 02/25/2022 in [place] at [place] Error: Question 6 not populated No Risk No Risk       Assessment and Plan: as follows  Prior documentation reviewed   Panic attacks; sporadic,  getting better, ativan helps with performance anxiety   anxiety unspecified: fair, husband has ocd, it effects her, provided supportive therapy and continue couples therapy  Insomnia:  manageable with ellavil continue sleep hygiene Meds refill sent  Fu 3 -74m  NMerian Capron MD 2/12/202410:07 AM

## 2022-07-05 ENCOUNTER — Ambulatory Visit (INDEPENDENT_AMBULATORY_CARE_PROVIDER_SITE_OTHER): Payer: 59 | Admitting: Family Medicine

## 2022-07-05 ENCOUNTER — Encounter: Payer: Self-pay | Admitting: Family Medicine

## 2022-07-05 VITALS — BP 110/75 | HR 89 | Temp 97.5°F | Ht 65.0 in | Wt 151.0 lb

## 2022-07-05 DIAGNOSIS — U071 COVID-19: Secondary | ICD-10-CM

## 2022-07-05 NOTE — Progress Notes (Signed)
   Acute Office Visit  Subjective:     Patient ID: Kelsey [name], female    DOB: 11-12-1980, 42 y.o.   MRN: [id number]  Chief Complaint  Patient presents with   Fatigue    Tested positive for COVID X Saturday 06/29/22- still c/o fatigue, congestion, not sleeping well, shortness of breath with exertion and heaviness in muscles.     HPI Patient is in today for acute visit.  Pt reports her son tested positive for Flu B. She started taking Tamiflu during that time. She started sneezing, rhinorrhea, watery eyes. She then had left sided head pain. Woke up Saturday for worsening headache. She went to urgent care and was diagnosed with Covid. She received Dexamethasone which helped a great deal with her symptoms. Now she has extreme fatigue and some SOB with minimal exertion. No chest pains. Denies fevers.   Review of Systems  Constitutional:  Positive for malaise/fatigue.  Neurological:  Positive for headaches.  All other systems reviewed and are negative.       Objective:    BP 110/75   Pulse 89   Temp (!) 97.5 F (36.4 C)   Ht 5' 5"$  (1.651 m)   Wt 151 lb (68.5 kg)   LMP  (LMP Unknown) Comment: takes OC continuoously  SpO2 100%   BMI 25.13 kg/m    Physical Exam Vitals and nursing note reviewed.  Constitutional:      Appearance: Normal appearance. She is normal weight.  HENT:     Head: Normocephalic and atraumatic.     Right Ear: External ear normal.     Left Ear: External ear normal.     Nose: Nose normal.     Mouth/Throat:     Mouth: Mucous membranes are moist.  Cardiovascular:     Rate and Rhythm: Normal rate and regular rhythm.     Pulses: Normal pulses.     Heart sounds: Normal heart sounds.  Pulmonary:     Effort: Pulmonary effort is normal. No respiratory distress.     Breath sounds: Normal breath sounds. No wheezing or rales.  Skin:    General: Skin is warm.     Capillary Refill: Capillary refill takes less than 2 seconds.  Neurological:     General: No  focal deficit present.     Mental Status: She is alert and oriented to person, place, and time. Mental status is at baseline.  Psychiatric:        Mood and Affect: Mood normal.        Behavior: Behavior normal.        Thought Content: Thought content normal.        Judgment: Judgment normal.    No results found for any visits on 07/05/22.      Assessment & Plan:   Problem List Items Addressed This Visit   None Visit Diagnoses     COVID-19    -  Primary      Discussed normal processes and symptoms with covid. All of her symptoms are to be expected. She needs plenty of rest and can add Elderberry, vitamin C and vitamin D to regimen.  Plenty of fluids.  No orders of the defined types were placed in this encounter.   No follow-ups on file.  Leeanne Rio, MD

## 2022-07-08 ENCOUNTER — Ambulatory Visit
Admission: RE | Admit: 2022-07-08 | Discharge: 2022-07-08 | Disposition: A | Discharge status: Home / Self Care | Payer: 59 | Source: Ambulatory Visit | Attending: Emergency Medicine | Admitting: Emergency Medicine

## 2022-07-08 VITALS — BP 132/84 | HR 130 | Temp 98.8°F | Resp 18 | Ht 65.0 in | Wt 147.0 lb

## 2022-07-08 DIAGNOSIS — J209 Acute bronchitis, unspecified: Secondary | ICD-10-CM

## 2022-07-08 DIAGNOSIS — U071 COVID-19: Secondary | ICD-10-CM | POA: Diagnosis not present

## 2022-07-08 MED ORDER — DEXAMETHASONE 6 MG PO TABS: 6.0000 mg | ORAL_TABLET | Freq: Two times a day (BID) | ORAL | 0 refills | Status: AC

## 2022-07-08 NOTE — ED Provider Notes (Signed)
[name]    CSN: [id number] Arrival date & time: 07/08/22  1000    HISTORY   Chief Complaint  Patient presents with   Cough    Covid positive 2/10Still coughing, headache, shortness of breath, racing heart, fatigue - Entered by patient   HPI Kelsey [name] is a pleasant, 42 y.o. female who presents to urgent care today. Patient tested positive for COVID-19 on 06/29/2022, still having sx's of cough, SOB, small amts of green nasal drainage, gets winded very easily, feels like her heart is beating fast.  Was seen by PCP on Friday advised to rest and take Vitamin C.  Patient concerned about elevated HR when up moving around and being winded with normal activity.  Patient states she is afraid to use her albuterol inhaler when she thinks she needs it because of her heart rate.  Patient states she currently takes a second-generation antihistamine every day for history of allergies.  Patient states she has not tried any other medications to alleviate her symptoms.  Patient has a heart rate of 130 on arrival with otherwise normal vital signs.  Patient appears to be in no acutedistress..  The history is provided by the patient.   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   Arthralgia 06/09/2017   Abnormal laboratory test 09/07/2014   Asthma, mild persistent 03/08/2014   GERD (gastroesophageal reflux disease) 03/08/2014   Seasonal allergies 03/08/2014    Irritable bowel syndrome with diarrhea 03/08/2014   Anxiety and depression 03/08/2014   Chronic daily headache 12/31/2013   Vertigo 12/31/2013   Postconcussive syndrome 12/31/2013   Past Surgical History:  Procedure Laterality Date   CESAREAN SECTION N/A 07/08/2015   Procedure: CESAREAN SECTION;  Surgeon: Aloha Gell, MD;  Location: [facility];  Service: Obstetrics;  Laterality: N/A;   ESOPHAGOGASTRODUODENOSCOPY     [facility] [id number]   NO PAST SURGERIES     OB History     Gravida  1   Para  1   Term  1   Preterm      AB      Living  1      SAB      IAB      Ectopic      Multiple  0   Live Births  1          Home Medications    Prior to Admission medications   Medication Sig Start Date End Date Taking? Authorizing Provider  albuterol (VENTOLIN HFA) 108 (90 Base) MCG/ACT inhaler INHALE 2 PUFFS INTO THE LUNGS EVERY 4 (FOUR) HOURS AS NEEDED FOR WHEEZING. 11/16/21  Yes Samuel Bouche, NP  amitriptyline (ELAVIL) 25 MG tablet TAKE 1 TABLET BY MOUTH EVERYDAY AT BEDTIME 07/01/22  Yes Merian Capron, MD  BIOTIN PO Take by mouth.   Yes [provider]  cetirizine (ZYRTEC)  10 MG tablet Take 10 mg by mouth daily.   Yes [provider]  dexamethasone (DECADRON) 6 MG tablet Take 1 tablet (6 mg total) by mouth 2 (two) times daily with a meal for 5 days. 07/08/22 07/13/22 Yes Lynden Oxford Scales, PA-C  diclofenac sodium (VOLTAREN) 1 % GEL Apply 4 g topically 4 (four) times daily. To affected joint. 06/09/17  Yes Gregor Hams, MD  EMGALITY 120 MG/ML SOAJ Inject 1 mL into the skin every 30 (thirty) days. 07/25/21  Yes [provider]  fluticasone (FLONASE) 50 MCG/ACT nasal spray SPRAY 2 SPRAYS INTO EACH NOSTRIL EVERY DAY 12/30/18  Yes Gregor Hams, MD  JUNEL 1/20 1-20 MG-MCG tablet TAKE 1 (ONE) TABLET DAILY CONTINUOUS ACTIVE PILLS 02/14/22  Yes Jessup, Joy, NP  LORazepam (ATIVAN) 0.5 MG tablet TAKE 1 TABLET BY MOUTH EVERY DAY AS NEEDED FOR ANXIETY  07/01/22  Yes Merian Capron, MD  meclizine (ANTIVERT) 25 MG tablet Take 1 tablet (25 mg total) by mouth 3 (three) times daily as needed for dizziness. 08/22/21  Yes Jessup, Joy, NP  montelukast (SINGULAIR) 10 MG tablet TAKE 1 TABLET BY MOUTH EVERYDAY AT BEDTIME 03/25/22  Yes Jessup, Joy, NP  omega-3 acid ethyl esters (LOVAZA) 1 g capsule Take by mouth 2 (two) times daily.   Yes [provider]  ondansetron (ZOFRAN-ODT) 4 MG disintegrating tablet Take 1 tablet (4 mg total) by mouth every 8 (eight) hours as needed for nausea or vomiting. 05/09/22  Yes Samuel Bouche, NP  pantoprazole (PROTONIX) 40 MG tablet TAKE 1 TABLET BY MOUTH EVERY DAY 05/14/22  Yes Samuel Bouche, NP  Prenatal Vit-Fe Fumarate-FA (PRENATAL MULTIVITAMIN) TABS tablet Take 1 tablet by mouth daily at 12 noon.   Yes [provider]  UBRELVY 100 MG TABS Take by mouth. 11/22/21  Yes [provider]    Family History Family History  Problem Relation Age of Onset   Hypertension Mother    Cancer Mother        multiple myeloma   Diabetes Mother    Obesity Mother    Hypertension Father    Stroke Father    Cancer Maternal Grandmother        CLL   Hypertension Maternal Grandmother    Post-traumatic stress disorder Maternal Uncle    Crohn's disease Cousin        maternal grandfather sister daughter   Colon cancer Neg Hx    Esophageal cancer Neg Hx    Social History Social History   Tobacco Use   Smoking status: Never   Smokeless tobacco: Never  Vaping Use   Vaping Use: Never used  Substance Use Topics   Alcohol use: No   Drug use: No   Allergies   Catfish [fish allergy], Fish-derived products, Banana, Citric acid, Flavoring agent, Monosodium glutamate, Pineapple, Promethazine hcl, Tomato, and Amoxicillin  Review of Systems Review of Systems Pertinent findings revealed after performing a 14 point review of systems has been noted in the history of present illness.  Physical Exam Vital Signs BP  132/84 (BP Location: Right Arm)   Pulse (!) 130   Temp 98.8 F (37.1 C) (Oral)   Resp 18   Ht 5' 5"$  (1.651 m)   Wt 147 lb (66.7 kg)   LMP  (LMP Unknown) Comment: takes OC continuoously  SpO2 100%   BMI 24.46 kg/m   No data found.  Physical Exam Vitals and nursing note reviewed.  Constitutional:      General: She is  not in acute distress.    Appearance: Normal appearance. She is not ill-appearing.  HENT:     Head: Normocephalic and atraumatic.     Salivary Glands: Right salivary gland is not diffusely enlarged or tender. Left salivary gland is not diffusely enlarged or tender.     Right Ear: Hearing, ear canal and external ear normal. No drainage. No middle ear effusion. There is no impacted cerumen. Tympanic membrane is bulging. Tympanic membrane is not erythematous.     Left Ear: Hearing, ear canal and external ear normal. No drainage.  No middle ear effusion. There is no impacted cerumen. Tympanic membrane is bulging. Tympanic membrane is not erythematous.     Ears:     Comments: Bilateral TMs bulging with clear fluid    Nose: Nose normal. No nasal deformity, septal deviation, mucosal edema, congestion or rhinorrhea.     Right Turbinates: Swollen and pale. Not enlarged.     Left Turbinates: Swollen and pale. Not enlarged.     Right Sinus: No maxillary sinus tenderness or frontal sinus tenderness.     Left Sinus: No maxillary sinus tenderness or frontal sinus tenderness.     Mouth/Throat:     Lips: Pink. No lesions.     Mouth: Mucous membranes are moist. No oral lesions.     Pharynx: Oropharynx is clear. Uvula midline. No posterior oropharyngeal erythema or uvula swelling.     Tonsils: No tonsillar exudate. 0 on the right. 0 on the left.  Eyes:     General: Lids are normal.        Right eye: No discharge.        Left eye: No discharge.     Extraocular Movements: Extraocular movements intact.     Conjunctiva/sclera: Conjunctivae normal.     Right eye: Right conjunctiva is  not injected.     Left eye: Left conjunctiva is not injected.  Neck:     Trachea: Trachea and phonation normal.  Cardiovascular:     Rate and Rhythm: Normal rate and regular rhythm.     Pulses: Normal pulses.     Heart sounds: Normal heart sounds. No murmur heard.    No friction rub. No gallop.  Pulmonary:     Effort: Pulmonary effort is normal. No tachypnea, bradypnea, accessory muscle usage, prolonged expiration, respiratory distress or retractions.     Breath sounds: No stridor, decreased air movement or transmitted upper airway sounds. No decreased breath sounds, wheezing, rhonchi or rales.     Comments: Rhonchorous cough Chest:     Chest wall: No tenderness.  Musculoskeletal:        General: Normal range of motion.     Cervical back: Normal range of motion and neck supple. Normal range of motion.  Lymphadenopathy:     Cervical: No cervical adenopathy.  Skin:    General: Skin is warm and dry.     Findings: No erythema or rash.  Neurological:     General: No focal deficit present.     Mental Status: She is alert and oriented to person, place, and time.  Psychiatric:        Mood and Affect: Mood normal.        Behavior: Behavior normal.     Visual Acuity Right Eye Distance:   Left Eye Distance:   Bilateral Distance:    Right Eye Near:   Left Eye Near:    Bilateral Near:     UC Couse / Diagnostics / Procedures:  Radiology No results found.  Procedures Procedures (including critical care time) EKG  Pending results:  Labs Reviewed - No data to display  Medications Ordered in UC: Medications - No data to display  UC Diagnoses / Final Clinical Impressions(s)   I have reviewed the triage vital signs and the nursing notes.  Pertinent labs & imaging results that were available during my care of the patient were reviewed by me and considered in my medical decision making (see chart for details).    Final diagnoses:  COVID-19  Acute bronchitis, unspecified  organism   Patient provided with a prescription for dexamethasone for viral bronchitis likely caused by COVID-19.  Based on physical exam findings, do not see indication for antibiotics at this time.  Patient advised to monitor symptoms closely for signs of bacterial infection and return for repeat evaluation if needed.  Patient advised to continue all allergy medications and to use asthma inhaler as often as needed.  Patient reassured that her oxygen saturation is at 100% and therefore likely not suffering from asthma exacerbation at this time.  As with her PCP, also recommend rest.  Patient provided with a note for work. Please see discharge instructions below for further details of plan of care as provided to patient. ED Prescriptions     Medication Sig Dispense Auth. Provider   dexamethasone (DECADRON) 6 MG tablet Take 1 tablet (6 mg total) by mouth 2 (two) times daily with a meal for 5 days. 10 tablet Lynden Oxford Scales, PA-C      PDMP not reviewed this encounter.  Disposition Upon Discharge:  Condition: stable for discharge home Home: take medications as prescribed; routine discharge instructions as discussed; follow up as advised.  Patient presented with an acute illness with associated systemic symptoms and significant discomfort requiring urgent management. In my opinion, this is a condition that a prudent lay person (someone who possesses an average knowledge of health and medicine) may potentially expect to result in complications if not addressed urgently such as respiratory distress, impairment of bodily function or dysfunction of bodily organs.   Routine symptom specific, illness specific and/or disease specific instructions were discussed with the patient and/or caregiver at length.   As such, the patient has been evaluated and assessed, work-up was performed and treatment was provided in alignment with urgent care protocols and evidence based medicine.   Patient/parent/caregiver has been advised that the patient may require follow up for further testing and treatment if the symptoms continue in spite of treatment, as clinically indicated and appropriate.  If the patient was tested for COVID-19, Influenza and/or RSV, then the patient/parent/guardian was advised to isolate at home pending the results of his/her diagnostic coronavirus test and potentially longer if they're positive. I have also advised pt that if his/her COVID-19 test returns positive, it's recommended to self-isolate for at least 10 days after symptoms first appeared AND until fever-free for 24 hours without fever reducer AND other symptoms have improved or resolved. Discussed self-isolation recommendations as well as instructions for household member/close contacts as per the [facility] and [place] DHHS, and also gave patient the [facility] packet with this information.  Patient/parent/caregiver has been advised to return to the [facility] or PCP in 3-5 days if no better; to PCP or the Emergency Department if new signs and symptoms develop, or if the current signs or symptoms continue to change or worsen for further workup, evaluation and treatment as clinically indicated and appropriate  The patient will follow up with their current  PCP if and as advised. If the patient does not currently have a PCP we will assist them in obtaining one.   The patient may need specialty follow up if the symptoms continue, in spite of conservative treatment and management, for further workup, evaluation, consultation and treatment as clinically indicated and appropriate.  Patient/parent/caregiver verbalized understanding and agreement of plan as discussed.  All questions were addressed during visit.  Please see discharge instructions below for further details of plan.  Discharge Instructions:   Discharge Instructions      Please continue your allergy and asthma medications as prescribed.  I have added an oral steroid to  your regimen called Decadron.  Please take 1 tablet twice daily for a full 5 days.  This steroid has been shown to improve the inflammation caused by COVID-19.  Please continue to monitor your symptoms for fever, body aches, new onset ear pain, sinus pain or pressure, cough that becomes productive of large amounts of darker sputum.  If any of these occur, please return for follow-up.  Thank you for visiting urgent care today.    This office note has been dictated using Museum/gallery curator.  Unfortunately, this method of dictation can sometimes lead to typographical or grammatical errors.  I apologize for your inconvenience in advance if this occurs.  Please do not hesitate to reach out to me if clarification is needed.      Lynden Oxford Scales, PA-C 07/08/22 1026

## 2022-07-08 NOTE — ED Triage Notes (Signed)
Patient tested positive for COVID on 06/29/2022, still having sx's of cough, SOB, green drainage, gets winded very easily.  Seen PCP on Friday advised to rest and take Vitamin C.  Patient concerned about elevated HR when up moving around and being winded.

## 2022-07-08 NOTE — Discharge Instructions (Signed)
Please continue your allergy and asthma medications as prescribed.  I have added an oral steroid to your regimen called Decadron.  Please take 1 tablet twice daily for a full 5 days.  This steroid has been shown to improve the inflammation caused by COVID-19.  Please continue to monitor your symptoms for fever, body aches, new onset ear pain, sinus pain or pressure, cough that becomes productive of large amounts of darker sputum.  If any of these occur, please return for follow-up.  Thank you for visiting urgent care today.

## 2022-07-10 ENCOUNTER — Encounter: Payer: Self-pay | Admitting: Family Medicine

## 2022-07-18 ENCOUNTER — Encounter: Payer: 59 | Admitting: Psychology

## 2022-07-19 ENCOUNTER — Telehealth: Payer: Self-pay | Admitting: General Practice

## 2022-07-19 ENCOUNTER — Encounter: Payer: Self-pay | Admitting: Medical-Surgical

## 2022-07-19 NOTE — Transitions of Care (Post Inpatient/ED Visit) (Signed)
   07/19/2022  Name: Kelsey [name] MRN: [id number] DOB: [birth year]  Today's TOC FU Call Status: Today's TOC FU Call Status:: Successful TOC FU Call Competed TOC FU Call Complete Date: 07/19/22  Transition Care Management Follow-up Telephone Call Date of Discharge: 07/18/22 Discharge Facility: Other ([facility]) Name of Other ([facility]) Discharge Facility: [facility] Type of Discharge: Emergency Department Reason for ED Visit: Respiratory Respiratory Diagnosis: Pnuemonia How have you been since you were released from the hospital?: Same Any questions or concerns?: No  Items Reviewed: Did you receive and understand the discharge instructions provided?: Yes Medications obtained and verified?: Yes (Medications Reviewed) Any new allergies since your discharge?: No Dietary orders reviewed?: NA Do you have support at home?: Yes  Home Care and Equipment/Supplies: [place] Ordered?: No Any new equipment or medical supplies ordered?: No  Functional Questionnaire: Do you need assistance with bathing/showering or dressing?: No Do you need assistance with meal preparation?: No Do you need assistance with eating?: No Do you have difficulty maintaining continence: No Do you need assistance with getting out of bed/getting out of a chair/moving?: No Do you have difficulty managing or taking your medications?: No  Folllow up appointments reviewed: PCP Follow-up appointment confirmed?: Yes Date of PCP follow-up appointment?: 07/22/22 Follow-up Provider: PCP [facility] Follow-up appointment confirmed?: Yes Date of Specialist follow-up appointment?: 07/26/22 Follow-Up Specialty Provider:: Cardiology Do you need transportation to your follow-up appointment?: No Do you understand care options if your condition(s) worsen?: Yes-patient verbalized understanding    SIGNATURE: Tinnie Gens, RN BSN

## 2022-07-22 ENCOUNTER — Ambulatory Visit (INDEPENDENT_AMBULATORY_CARE_PROVIDER_SITE_OTHER): Payer: 59 | Admitting: Medical-Surgical

## 2022-07-22 ENCOUNTER — Encounter: Payer: Self-pay | Admitting: Medical-Surgical

## 2022-07-22 ENCOUNTER — Ambulatory Visit (INDEPENDENT_AMBULATORY_CARE_PROVIDER_SITE_OTHER): Payer: 59

## 2022-07-22 VITALS — BP 106/74 | HR 121 | Resp 20 | Ht 65.0 in | Wt 150.9 lb

## 2022-07-22 DIAGNOSIS — R002 Palpitations: Secondary | ICD-10-CM

## 2022-07-22 DIAGNOSIS — Z09 Encounter for follow-up examination after completed treatment for conditions other than malignant neoplasm: Secondary | ICD-10-CM

## 2022-07-22 DIAGNOSIS — M79672 Pain in left foot: Secondary | ICD-10-CM

## 2022-07-22 DIAGNOSIS — R42 Dizziness and giddiness: Secondary | ICD-10-CM | POA: Diagnosis not present

## 2022-07-22 MED ORDER — PROPRANOLOL HCL 10 MG PO TABS: 10.0000 mg | ORAL_TABLET | Freq: Three times a day (TID) | ORAL | 0 refills | Status: DC

## 2022-07-22 NOTE — Progress Notes (Signed)
Established Patient Office Visit  Subjective   Patient ID: Kelsey [name], female   DOB: 09/04/1980 Age: 42 y.o. MRN: [id number]   Chief Complaint  Patient presents with   Follow-up   Tachycardia   Pneumonia   HPI Pleasant 42 year old female presenting today for a hospital discharge follow-up.  She was taken to the emergency room by EMS on 07/17/2022 with complaints of worsened palpitations along with upper back and neck pain.  At the time of arrival, she was slightly tachycardic at 110-120.  After a comprehensive evaluation, it was determined that she had left lower lobe pneumonia.  She was discharged with azithromycin and a referral was placed for cardiology.  Since her discharge she has continued to have concerning symptoms.  Her heart rate resting is at 110-120.  She has been taking azithromycin as prescribed.  Notes that she has been having dizziness since February 26 but is not sure if this is related to her current cardiac issues or if this is more related to vertigo which she has had in the past.  She did take methocarbamol that was prescribed for the intermittent facial and neck numbness but after 1 dose, she woke the next morning with left foot arch pain rated 4 at rest, 6-7 with weightbearing.  She stopped taking the methocarbamol after this and reports the facial and neck numbness have continued.   Objective:    Vitals:   07/22/22 0912  BP: 106/74  Pulse: (!) 121  Resp: 20  Height: '5\' 5"'$  (1.651 m)  Weight: 150 lb 14.4 oz (68.4 kg)  SpO2: 100%  BMI (Calculated): 25.11   Physical Exam Vitals reviewed.  Constitutional:      General: She is not in acute distress.    Appearance: Normal appearance. She is not ill-appearing.  HENT:     Head: Normocephalic and atraumatic.  Cardiovascular:     Rate and Rhythm: Normal rate and regular rhythm.     Pulses: Normal pulses.     Heart sounds: Normal heart sounds.  Pulmonary:     Effort: Pulmonary effort is normal. No respiratory  distress.     Breath sounds: Normal breath sounds. No wheezing, rhonchi or rales.  Skin:    General: Skin is warm and dry.  Neurological:     Mental Status: She is alert and oriented to person, place, and time.  Psychiatric:        Mood and Affect: Mood normal.        Behavior: Behavior normal.        Thought Content: Thought content normal.        Judgment: Judgment normal.   No results found for this or any previous visit (from the past 24 hour(s)).     The ASCVD Risk score (Arnett DK, et al., 2019) failed to calculate for the following reasons:   The 2019 ASCVD risk score is only valid for ages 68 to 28   Assessment & Plan:   1. Hospital discharge follow-up Reviewed hospital documentation and care with patient.  She is finishing azithromycin for left lower lobe pneumonia.  Advised that this medication continues to work in the body for a full 10 days so expect symptoms to continue improving.  We will plan for a repeat chest x-ray in 3 weeks to verify clearance.  2. Palpitations Checking TSH.  Other workup completed in the hospital was comprehensive.  Recommend continuing with the plan to see cardiology.  Adding propranolol 10 mg 3 times daily as  needed for sustained heart rate greater than 120.  Monitor blood pressure if taking propranolol to avoid hypotension. - TSH - DG Chest 2 View; Future  3. Left foot pain Unclear etiology.  Do not feel that this was related to methocarbamol.  Getting left foot x-ray today.  Given the location of the pain in the arch of the foot, would like to have her treat conservatively with ibuprofen/Tylenol, heat/ice, massage, and stretching. - DG Foot Complete Left; Future  4. Dizziness Unclear if this is cardiac in nature or vertiginous.  Recommend trialing meclizine 25 mg 3 times daily as needed.  She has this at home so no need for new prescription.  Advised to avoid driving while dizziness is present.  Return for repeat chest x-ray in 3  weeks.  ___________________________________________ Clearnce Sorrel, DNP, APRN, FNP-BC Primary Care and Sports Medicine [place]

## 2022-07-23 LAB — TSH: TSH: 0.72 mIU/L

## 2022-07-26 DIAGNOSIS — J45909 Unspecified asthma, uncomplicated: Secondary | ICD-10-CM | POA: Insufficient documentation

## 2022-07-29 ENCOUNTER — Encounter: Payer: Self-pay | Admitting: Medical-Surgical

## 2022-07-29 DIAGNOSIS — J4530 Mild persistent asthma, uncomplicated: Secondary | ICD-10-CM

## 2022-08-13 ENCOUNTER — Ambulatory Visit (INDEPENDENT_AMBULATORY_CARE_PROVIDER_SITE_OTHER): Payer: 59

## 2022-08-13 DIAGNOSIS — R002 Palpitations: Secondary | ICD-10-CM | POA: Diagnosis not present

## 2022-08-14 ENCOUNTER — Ambulatory Visit (INDEPENDENT_AMBULATORY_CARE_PROVIDER_SITE_OTHER): Payer: 59 | Admitting: Internal Medicine

## 2022-08-14 ENCOUNTER — Encounter: Payer: Self-pay | Admitting: Internal Medicine

## 2022-08-14 VITALS — BP 106/70 | HR 103 | Temp 98.9°F | Ht 65.0 in | Wt 157.8 lb

## 2022-08-14 DIAGNOSIS — J4540 Moderate persistent asthma, uncomplicated: Secondary | ICD-10-CM | POA: Diagnosis not present

## 2022-08-14 DIAGNOSIS — U099 Post covid-19 condition, unspecified: Secondary | ICD-10-CM | POA: Diagnosis not present

## 2022-08-14 DIAGNOSIS — J3089 Other allergic rhinitis: Secondary | ICD-10-CM

## 2022-08-14 LAB — POCT EXHALED NITRIC OXIDE: FeNO level (ppb): 39

## 2022-08-14 MED ORDER — FLUTICASONE-SALMETEROL 250-50 MCG/ACT IN AEPB: 1.0000 | INHALATION_SPRAY | Freq: Two times a day (BID) | RESPIRATORY_TRACT | 5 refills | Status: DC

## 2022-08-14 NOTE — Patient Instructions (Addendum)
Please schedule follow up scheduled with myself in 3 months.  If my schedule is not open yet, we will contact you with a reminder closer to that time. Please call [phone] if you haven't heard from [place] a month before.   Start taking advair 1 puff twice a day, gargle after use. Continue your allergy medications as you are already doing.  We will get some breathing testing at your next visit.   Long covid symptoms are frustrating and can be debilitating.  This website might be helpful with videos, education, and suggested exercises to help you manage your symptoms.   [email or web address].[email or web address]  Take the albuterol rescue inhaler every 4 to 6 hours as needed for wheezing or shortness of breath. You can also take it 15 minutes before exercise or exertional activity. Side effects include heart racing or pounding, jitters or anxiety. If you have a history of an irregular heart rhythm, it can make this worse. Can also give some patients a hard time sleeping.  Instructions For Advair Diskus Take the ADVAIR DISKUS out of the box and foil overwrap pouch. Write the "Pouch opened" and "Use by" dates on the label on top of the DISKUS. The "Use by" date is 1 month from date of opening the pouch.     * The DISKUS will be in the closed position when the pouch is opened.     * The dose indicator on the top of the DISKUS tells you how many doses are left. The dose indicator number will decrease each time you use the DISKUS. After you have used 55 doses from the DISKUS, the numbers 5 to 0 will appear in red to warn you that there are only a few doses left. If you are using a "sample" DISKUS, the numbers 5 to 0 will appear in red after 23 doses.  Taking a dose from the DISKUS requires the following 3 simple steps: Open, Click, Inhale.  OPEN Hold the DISKUS in one hand and put the thumb of your other hand on the thumbgrip. Push your thumb away from you as far as it will go until the mouthpiece appears  and snaps into position.  2.   CLICK Hold the DISKUS in a level, flat position with the mouthpiece toward you. Slide the lever away from you as far as it will go until it clicks. The DISKUS is now ready to use. Every time the lever is pushed back, a dose is ready to be inhaled. This is shown by a decrease in numbers on the dose counter. To avoid releasing or wasting doses once the DISKUS is ready: Do not close the DISKUS. Do not tilt the DISKUS. Do not play with the lever. Do not move the lever more than once.  3.   INHALE Before inhaling your dose from the DISKUS, breathe out (exhale) fully while holding the DISKUS level and away from your mouth. Remember, never breathe out into the DISKUS mouthpiece.  Put the mouthpiece to your lips. Breathe in quickly and deeply through the DISKUS. Do not breathe in through your nose.  Remove the DISKUS from your mouth. Hold your breath for about 10 seconds, or for as long as is comfortable. Breathe out slowly.  The DISKUS delivers your dose of medicine as a very fine powder. Most patients can taste or feel the powder. Do not use another dose from the DISKUS if you do not feel or taste the medicine.  Rinse your mouth with water after breathing  in the medicine. Spit the water out. Do not swallow.  4.  CLOSE the DISKUS when you are finished taking a dose so that the DISKUS will be ready for you to take your next dose. Put your thumb on the thumbgrip and slide the thumbgrip back toward you as far as it will go. The DISKUS will click shut. The lever will automatically return to its original position. The DISKUS is now ready for you to take your next scheduled dose, due in about 12 hours. (Repeat the steps 1 to 4.)  REMEMBER: Never breathe into the DISKUS. Never take the DISKUS apart. Always ready and use the DISKUS in a level, flat position. Do not use the DISKUS with a spacer device. After each dose, rinse your mouth with water and spit the water out. Do  not swallow. Never wash the mouthpiece or any part of the DISKUS. Keep it dry. Always keep the DISKUS in a dry place. Never take an extra dose, even if you did not taste or feel the medicine.

## 2022-08-14 NOTE — Progress Notes (Signed)
Kelsey [name]    [id number]    1980/09/10  Primary Care Physician:Jessup, Caryl Asp, NP  Referring Physician: Samuel Bouche, NP [place],  [place] [address] Reason for Consultation: "asthma" Date of Consultation: 08/14/2022  Chief complaint:   Chief Complaint  Patient presents with   Consult    Asthma. Long COVID.  C/o labored breathing with some cough and wheeze.  Sx x several years.     HPI: Kelsey [name] is a 42 y.o. woman who presents for new patient evaluation of persistent asthma after covid infection. She has childhood asthma which she outgrew. As a young adult after volunteering in [place] hurricane [place] in [place] she developed recurrence of symptoms. She was managed with a rescue inhaler. Needed prednisone for URIs multiple times a year (on average 4 times/year.) never seen pulmonology before. Never been on a maintenance inhaler.   She was seen in the urgent care in Feb 2024 after testing positive for covid. Now having worsening shortness of breath with minimal exertion. Having worsening tachycardia. Also having brain fog, fatigue.   Has been prescribed albuterol inhaler. It helps but makes her tachycardic. She sometimes has to stop and sit down with minimal walking due to dyspnea and tachycardia.   Current Regimen: albuterol prn Asthma Triggers: rainy weather, exertion, URIs, anxiety Exacerbations in the last year: 4-5 requiring prednisone History of hospitalization or intubation: never been hospitalized but had ED visits as an adult last in 2007.  Allergy Testing: had SPT after 2007. Allergic to grasses, horses, cats.  GERD: yes, on protonix daily.  Allergic Rhinitis: on singulair and flonase for rhinitis with cetirizine.  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   Smoking history: passive smoke exposure in childhood.   Social History   Occupational History   Not on file  Tobacco Use   Smoking status: Never    Passive exposure: Past   Smokeless tobacco: Never  Vaping Use   Vaping Use: Never used  Substance and Sexual Activity   Alcohol use: No   Drug use: No   Sexual activity: Yes    Partners: Male    Birth control/protection: Pill    Relevant family history:  Family History  Problem Relation Age of Onset   Hypertension Mother    Cancer Mother        multiple myeloma   Diabetes Mother    Obesity Mother    Asthma Mother    Hypertension Father    Stroke Father    Cancer Maternal Grandmother        CLL   Hypertension Maternal Grandmother    Post-traumatic stress disorder Maternal Uncle    Crohn's disease Cousin        maternal grandfather sister daughter   Colon cancer Neg Hx    Esophageal cancer Neg Hx     Past Medical History:  Diagnosis Date   Allergy    Anxiety and depression    Asthma    Brain injury ([place]) 2014   Clotting disorder ([place]) 02/17/2022   Depression    Endometriosis    Frequent headaches    GERD (gastroesophageal reflux disease)    Hx of varicella    IBS (irritable bowel syndrome)    Migraines  Panic attacks    Postpartum care following cesarean delivery (2/18) 07/08/2015   Seizure ([place])    Vertigo 12/31/2013    Past Surgical History:  Procedure Laterality Date   CESAREAN SECTION N/A 07/08/2015   Procedure: CESAREAN SECTION;  Surgeon: Aloha Gell, MD;  Location: [facility];  Service: Obstetrics;  Laterality: N/A;   ESOPHAGOGASTRODUODENOSCOPY     [facility] 2009-2010   NO PAST SURGERIES       Physical Exam: Blood pressure 106/70, pulse (!) 103, temperature 98.9 F (37.2 C), temperature source Oral, height 5\' 5"  (1.651 m), weight 157 lb 12.8 oz (71.6 kg), SpO2 98 %. Gen:      No acute distress ENT:  no nasal polyps, mucus membranes moist Lungs:    No increased respiratory effort,  symmetric chest wall excursion, clear to auscultation bilaterally, no wheezes or crackles CV:         Regular rate and rhythm; no murmurs, rubs, or gallops.  No pedal edema Her HR did come down from 103 on my exam and was around 80 Abd:      + bowel sounds; soft, non-tender; no distension MSK: no acute synovitis of DIP or PIP joints, no mechanics hands.  Skin:      Warm and dry; no rashes Neuro: normal speech, no focal facial asymmetry Psych: alert and oriented x3, normal mood and affect   Data Reviewed/Medical Decision Making:  Independent interpretation of tests: Imaging:  Review of patient's chest xray 3/26 images revealed no acute process. The patient's images have been independently reviewed by me.    PFTs:  Labs:  Lab Results  Component Value Date   NA 139 02/09/2021   K 4.4 02/09/2021   CO2 25 02/09/2021   GLUCOSE 68 02/09/2021   BUN 13 02/09/2021   CREATININE 0.99 (H) 02/09/2021   CALCIUM 9.6 02/09/2021   EGFR 75 02/09/2021   GFRNONAA 71 02/11/2020   Lab Results  Component Value Date   WBC 3.8 02/09/2021   HGB 14.7 02/09/2021   HCT 43.8 02/09/2021   MCV 87.8 02/09/2021   PLT 268 02/09/2021     Immunization status:  Immunization History  Administered Date(s) Administered   COVID-19, mRNA, vaccine(Comirnaty)12 years and older 05/14/2022   Hepatitis A 04/23/2006, 04/02/2007   Hepatitis B, ADULT 10/26/2010, 08/20/2011   IPV 04/02/2007   Influenza,inj,Quad PF,6+ Mos 03/08/2014, 01/14/2019, 02/08/2020, 02/09/2021, 05/09/2022   Influenza-Unspecified 02/17/2017   MMR 03/03/1984, 07/20/1996   Meningococcal polysaccharide vaccine (MPSV4) 11/17/2000   Moderna Sars-Covid-2 Vaccination 09/10/2019, 10/08/2019   OPV 01/16/1988   PFIZER Comirnaty(Gray Top)Covid-19 Tri-Sucrose Vaccine 05/14/2022   PPD Test 11/19/2000   Td 08/22/2003   Tdap 03/08/2014, 09/25/2019   Typhoid Parenteral 04/23/2006     I reviewed prior external note(s) from PCP, urgent care  I reviewed  the result(s) of the labs and imaging as noted above.   I have ordered feno, spiro.    Assessment:  Moderate persistent asthma, not well controlled Long covid with tachycardia, brain fog Perennial allergic rhinitis  Plan/Recommendations: Exhaled nitric oxide obtain today shows 39 ppb consistent with markers of allergic inflammation. Spirometry machine down - will order and obtain before next visit.   Start taking advair 1 puff twice a day, gargle after use. Continue your allergy medications as you are already doing.  We will get some breathing testing at your next visit.   Take the albuterol rescue inhaler every 4 to 6 hours as needed for wheezing or shortness of breath.  Long covid resources provided.   We discussed disease management and progression at length today.     Return to Care: Return in about 3 months (around 11/14/2022).  Lenice Llamas, MD Pulmonary [facility] [place]  CC: Samuel Bouche, NP

## 2022-08-14 NOTE — Addendum Note (Signed)
Addended byOralia Rud M on: 08/14/2022 03:55 PM   Modules accepted: Orders

## 2022-08-19 ENCOUNTER — Ambulatory Visit (INDEPENDENT_AMBULATORY_CARE_PROVIDER_SITE_OTHER): Payer: 59 | Admitting: Medical-Surgical

## 2022-08-19 VITALS — BP 105/71 | HR 97 | Resp 20 | Ht 65.0 in | Wt 156.5 lb

## 2022-08-19 DIAGNOSIS — M940 Chondrocostal junction syndrome [Tietze]: Secondary | ICD-10-CM | POA: Diagnosis not present

## 2022-08-19 NOTE — Assessment & Plan Note (Signed)
Has had several weeks-months of chest pain in the sternal area as well as under bilateral breasts.  Originally thought this was due to pneumonia secondary to [place].  Her repeat chest x-ray came back showing that has fully cleared however the chest discomfort is still present.  Areas in question are bit tender when palpated so feel this is likely costochondritis.  Cardiology on board with no cardiac concerns.  Recommend conservative measures with anti-inflammatories and formal physical therapy.  Will use OTC anti-inflammatories.

## 2022-08-19 NOTE — Progress Notes (Signed)
        Established patient visit  History, exam, impression, and plan:  Costochondritis Has had several weeks-months of chest pain in the sternal area as well as under bilateral breasts.  Originally thought this was due to pneumonia secondary to [place].  Her repeat chest x-ray came back showing that has fully cleared however the chest discomfort is still present.  Areas in question are bit tender when palpated so feel this is likely costochondritis.  Cardiology on board with no cardiac concerns.  Recommend conservative measures with anti-inflammatories and formal physical therapy.  Will use OTC anti-inflammatories.  Procedures performed this visit: None.  Return in about 6 weeks (around 09/30/2022) for costochondritis follow up.  __________________________________ Clearnce Sorrel, DNP, APRN, FNP-BC Primary Care [facility] [place]

## 2022-08-20 ENCOUNTER — Encounter: Payer: Self-pay | Admitting: Medical-Surgical

## 2022-09-10 ENCOUNTER — Ambulatory Visit: Payer: 59 | Attending: Medical-Surgical | Admitting: Physical Therapy

## 2022-09-10 ENCOUNTER — Other Ambulatory Visit: Payer: Self-pay

## 2022-09-10 ENCOUNTER — Encounter: Payer: Self-pay | Admitting: Physical Therapy

## 2022-09-10 DIAGNOSIS — R293 Abnormal posture: Secondary | ICD-10-CM | POA: Diagnosis not present

## 2022-09-10 DIAGNOSIS — M940 Chondrocostal junction syndrome [Tietze]: Secondary | ICD-10-CM | POA: Insufficient documentation

## 2022-09-10 DIAGNOSIS — M6281 Muscle weakness (generalized): Secondary | ICD-10-CM | POA: Insufficient documentation

## 2022-09-10 NOTE — Therapy (Signed)
OUTPATIENT PHYSICAL THERAPY CERVICAL EVALUATION   Patient Name: Kelsey [name] MRN: [id number] DOB:06-01-1980, 42 y.o., female Today's Date: 09/10/2022  END OF SESSION:  PT End of Session - 09/10/22 1005     Visit Number 1    Number of Visits 1    Date for PT Re-Evaluation 09/10/22    PT Start Time 0930    PT Stop Time 1005    PT Time Calculation (min) 35 min    Activity Tolerance Patient tolerated treatment well    Behavior During Therapy [facility] for tasks assessed/performed             Past Medical History:  Diagnosis Date   Allergy    Anxiety and depression    Asthma    Brain injury 2014   Clotting disorder 02/17/2022   Depression    Endometriosis    Frequent headaches    GERD (gastroesophageal reflux disease)    Hx of varicella    IBS (irritable bowel syndrome)    Migraines    Panic attacks    Postpartum care following cesarean delivery (2/18) 07/08/2015   Seizure    Vertigo 12/31/2013   Past Surgical History:  Procedure Laterality Date   CESAREAN SECTION N/A 07/08/2015   Procedure: CESAREAN SECTION;  Surgeon: Noland Fordyce, MD;  Location: [facility];  Service: Obstetrics;  Laterality: N/A;   ESOPHAGOGASTRODUODENOSCOPY     [facility] 2009-2010   NO PAST SURGERIES     Patient Active Problem List   Diagnosis Date Noted   Costochondritis 08/19/2022   Asthma 07/26/2022   Tachycardia 06/29/2022   Encounter for female birth control 06/01/2019   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   Arthralgia 06/09/2017   Anxiety 01/24/2016   Migraine without aura and without status migrainosus, not intractable 01/24/2016   Seasonal allergic rhinitis due to pollen 01/24/2016   Abnormal laboratory test 09/07/2014   Asthma, mild persistent 03/08/2014   GERD (gastroesophageal reflux disease) 03/08/2014   Seasonal allergies 03/08/2014   Irritable bowel syndrome with diarrhea 03/08/2014   Anxiety and depression 03/08/2014   Chronic daily headache 12/31/2013   Vertigo  12/31/2013   Postconcussive syndrome 12/31/2013    PCP: Christen Butter  REFERRING PROVIDER: Christen Butter  REFERRING DIAG: Costochodritis  THERAPY DIAG:  Abnormal posture  Muscle weakness (generalized)  Rationale for Evaluation and Treatment: Rehabilitation  ONSET DATE: 06/2022  SUBJECTIVE:  SUBJECTIVE STATEMENT: Pt had Covid 06/2022 which developed into long covid and pt had been having rib pain mostly on the left side. Pt states pain comes in "episodes", usually 1-2x a day. Some increased pain after prolonged computer or crafting work, pain last less than 1 minute. Hard to take a deep breath when pain occurs. MD recommended to decrease lifting, no wearing a heavy purse   PERTINENT HISTORY:  Long covid, history of costochondritis years ago  PAIN:  Are you having pain?  None currently, up to 7/10 when pain occurs  PRECAUTIONS: None  WEIGHT BEARING RESTRICTIONS: No  FALLS:  Has patient fallen in last 6 months? 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  PLOF: Independent  PATIENT GOALS: decrease occurrence of pain  NEXT MD VISIT: 5/13  OBJECTIVE:   POSTURE: rounded shoulders  PALPATION: TTP Lt anterior ribs, pecs    UPPER EXTREMITY ROM: WFL  UPPER EXTREMITY MMT: Grossly 4/5 bilat    TODAY'S TREATMENT:                                                                                                                              DATE: Eval only   PATIENT EDUCATION:  Education details: PT POC, HEP Person educated: Patient Education method: Programmer, multimedia, Demonstration, and Handouts Education comprehension: verbalized understanding and returned demonstration  HOME EXERCISE PROGRAM: Access Code: [email or web address] URL: [email or web address] Date:  09/10/2022 Prepared by: Reggy Eye  Exercises - Sidelying Open Book Thoracic Lumbar Rotation and Extension  - 1 x daily - 7 x weekly - 1 sets - 10 reps - Quadruped Full Range Thoracic Rotation with Reach  - 1 x daily - 7 x weekly - 1 sets - 10 reps - Doorway Pec Stretch at 90 Degrees Abduction  - 1 x daily - 7 x weekly - 1 sets - 3 reps - 20-30 seconds hold - Standing Bilateral Low Shoulder Row with Anchored Resistance  - 1 x daily - 7 x weekly - 3 sets - 10 reps - Standing Shoulder Horizontal Abduction with Anchored Resistance  - 1 x daily - 7 x weekly - 3 sets - 10 reps - Low Trap Setting at Wall  - 1 x daily - 7 x weekly - 3 sets - 10 reps  ASSESSMENT:  CLINICAL IMPRESSION: Patient is a 42 y.o. female who was seen today for physical therapy evaluation and treatment for costochondritis. She presents with impaired posture, increased pain, decreased postural strength and increased muscle tightness. Pt will benefit from skilled PT for education on HEP to address deficits and improve functional mobility.   OBJECTIVE IMPAIRMENTS: decreased strength, postural dysfunction, and pain.   ACTIVITY LIMITATIONS: lifting  PARTICIPATION LIMITATIONS: shopping and community activity  PERSONAL FACTORS: Time since onset of injury/illness/exacerbation are also affecting patient's functional outcome.   REHAB POTENTIAL: Good  CLINICAL DECISION MAKING: Evolving/moderate complexity  EVALUATION COMPLEXITY: Moderate   GOALS: Eval only - no goals  set    PLAN:  PT FREQUENCY: one time visit  PT DURATION:  1 sessions  PLANNED INTERVENTIONS: Therapeutic exercises, Therapeutic activity, Neuromuscular re-education, Balance training, Gait training, Patient/Family education, Self Care, and Joint mobilization  PLAN FOR NEXT SESSION: eval only   Tijah Hane, PT 09/10/2022, 10:06 AM

## 2022-09-11 ENCOUNTER — Other Ambulatory Visit: Payer: Self-pay | Admitting: Medical-Surgical

## 2022-09-19 ENCOUNTER — Ambulatory Visit: Payer: 59 | Admitting: Internal Medicine

## 2022-09-24 ENCOUNTER — Other Ambulatory Visit: Payer: Self-pay | Admitting: Medical-Surgical

## 2022-09-30 ENCOUNTER — Encounter: Payer: Self-pay | Admitting: Medical-Surgical

## 2022-09-30 ENCOUNTER — Ambulatory Visit (INDEPENDENT_AMBULATORY_CARE_PROVIDER_SITE_OTHER): Payer: 59 | Admitting: Medical-Surgical

## 2022-09-30 VITALS — BP 111/68 | HR 91 | Resp 20 | Ht 65.0 in | Wt 162.2 lb

## 2022-09-30 DIAGNOSIS — R5383 Other fatigue: Secondary | ICD-10-CM | POA: Diagnosis not present

## 2022-09-30 DIAGNOSIS — M940 Chondrocostal junction syndrome [Tietze]: Secondary | ICD-10-CM

## 2022-09-30 DIAGNOSIS — R1084 Generalized abdominal pain: Secondary | ICD-10-CM | POA: Diagnosis not present

## 2022-09-30 NOTE — Patient Instructions (Signed)
Anti-inflammatory diet Try gluten free

## 2022-09-30 NOTE — Progress Notes (Signed)
        Established patient visit  History, exam, impression, and plan:  1. Costochondritis Pleasant 42 year old female presenting today for follow-up on costochondritis.  She has completed at least 6 weeks of physical therapy which she reports included stretches and strengthening exercises.  Reports that these activities actually made her costochondritis symptoms worse for a bit.  Found this reassuring as it indicated this was not a cardiovascular cause.  Notes that occasionally, the stretches help a little.  Not currently taking any medications for management of this.  Continues to have tenderness to palpation along the anterior chest wall.  As she likes to avoid unnecessary medications, advised to look into dietary modifications such as the anti-inflammatory diet and or gluten-free as these have been helpful for others in the past.  Continue stretches and conservative measures.  2. Generalized abdominal pain Over the last week or so, has had nausea with increased abdominal discomfort.  No diarrhea or vomiting.  Having regular bowel movements but does not feel like she is emptying completely.  No melena or hematochezia.  No recent known sick contacts.  Abdomen soft, nondistended.  Diffusely mildly tender.  Bowel sounds positive x 4 quadrants.  No HSM.  Continue daily fiber supplementation.  Add MiraLAX 17 g daily for the next 3-5 days for colon cleanse then daily as needed.  3. Fatigue, unspecified type Continues to have significant fatigue since having COVID.  Gets dyspneic on exertion.  Other labs up-to-date and look good.  Checking iron panel today. - Iron, TIBC and Ferritin Panel  Procedures performed this visit: None.  Return in about 3 months (around 12/31/2022) for chronic disease follow up.  __________________________________ Thayer Ohm, DNP, APRN, FNP-BC Primary Care and Sports Medicine [facility]

## 2022-10-01 LAB — IRON,TIBC AND FERRITIN PANEL
%SAT: 37 % (calc) (ref 16–45)
Ferritin: 61 ng/mL (ref 16–154)
Iron: 116 ug/dL (ref 40–190)
TIBC: 312 mcg/dL (calc) (ref 250–450)

## 2022-10-07 ENCOUNTER — Encounter (HOSPITAL_COMMUNITY): Payer: Self-pay | Admitting: Psychiatry

## 2022-10-07 ENCOUNTER — Telehealth (INDEPENDENT_AMBULATORY_CARE_PROVIDER_SITE_OTHER): Payer: 59 | Admitting: Psychiatry

## 2022-10-07 DIAGNOSIS — F419 Anxiety disorder, unspecified: Secondary | ICD-10-CM | POA: Diagnosis not present

## 2022-10-07 DIAGNOSIS — F5102 Adjustment insomnia: Secondary | ICD-10-CM

## 2022-10-07 DIAGNOSIS — F410 Panic disorder [episodic paroxysmal anxiety] without agoraphobia: Secondary | ICD-10-CM | POA: Diagnosis not present

## 2022-10-07 MED ORDER — AMITRIPTYLINE HCL 25 MG PO TABS: ORAL_TABLET | ORAL | 0 refills | Status: DC

## 2022-10-07 MED ORDER — LORAZEPAM 0.5 MG PO TABS: ORAL_TABLET | ORAL | 0 refills | Status: DC

## 2022-10-07 NOTE — Progress Notes (Signed)
[facility] Follow up visit  Patient Identification: Kelsey [name] MRN:  [id number] Date of Evaluation:  10/07/2022 Referral Source: GI  Chief Complaint:  follow up sleep, anxiety Visit Diagnosis:    ICD-10-CM   1. Anxiety disorder, unspecified type  F41.9     2. Panic attacks  F41.0     3. Adjustment insomnia  F51.02       Virtual Visit via Video Note  I connected with Kelsey [name] on 10/07/22 at  2:00 PM EDT by a video enabled telemedicine application and verified that I am speaking with the correct person using two identifiers.  Location: Patient: home  Provider: home office   I discussed the limitations of evaluation and management by telemedicine and the availability of in person appointments. The patient expressed understanding and agreed to proceed.      I discussed the assessment and treatment plan with the patient. The patient was provided an opportunity to ask questions and all were answered. The patient agreed with the plan and demonstrated an understanding of the instructions.   The patient was advised to call back or seek an in-person evaluation if the symptoms worsen or if the condition fails to improve as anticipated.  I provided 20 minutes of non-face-to-face time during this encounter.     History of Present Illness: Patient is a 42 years old currently married African-American female referred by GI doctor for establishment of care for possible anxiety.  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, relationship can be, long  covid Modifying factor: family Severity manageable  Duration more so last few years   Past Psychiatric History: anxiety  Consequences of Substance Abuse: NA  Past Medical History:  Past Medical History:  Diagnosis Date   Allergy    Anxiety and depression    Asthma    Brain injury (HCC) 2014   Clotting disorder (HCC) 02/17/2022   Depression    Endometriosis    Frequent headaches    GERD (gastroesophageal reflux disease)    Hx of varicella    IBS (irritable bowel syndrome)    Migraines    Panic attacks    Postpartum care following cesarean delivery (2/18) 07/08/2015   Seizure (HCC)    Vertigo 12/31/2013    Past Surgical History:  Procedure Laterality Date   CESAREAN SECTION N/A 07/08/2015   Procedure: CESAREAN SECTION;  Surgeon: Noland Fordyce, MD;  Location: [facility];  Service: Obstetrics;  Laterality: N/A;   ESOPHAGOGASTRODUODENOSCOPY     [facility] 2009-2010   NO PAST SURGERIES      Family Psychiatric History: Mom: OCD possibel  Family History:  Family History  Problem Relation Age of Onset   Hypertension Mother    Cancer Mother        multiple myeloma   Diabetes Mother    Obesity Mother    Asthma Mother    Hypertension Father    Stroke Father    Cancer Maternal Grandmother        CLL   Hypertension Maternal Grandmother    Post-traumatic stress disorder  Maternal Uncle    Crohn's disease Cousin        maternal grandfather sister daughter   Colon cancer Neg Hx    Esophageal cancer Neg Hx     Social History:   Social History   Socioeconomic History   Marital status: Married    Spouse name: Not on file   Number of children: 0   Years of education: Not on file   Highest education level: Master's degree (e.g., MA, MS, MEng, MEd, MSW, MBA)  Occupational History   Not on file  Tobacco Use   Smoking status: Never    Passive exposure: Past   Smokeless tobacco: Never  Vaping Use   Vaping Use: Never used  Substance and Sexual Activity    Alcohol use: No   Drug use: No   Sexual activity: Yes    Partners: Male    Birth control/protection: Pill  Other Topics Concern   Not on file  Social History Narrative   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      Home Situation: lives with husband      Spiritual Beliefs: Christian      Lifestyle: doing 20 minutes of exercise and a little weight lifting daily; diet is healthy            Social Determinants of Health   Financial Resource Strain: Low Risk  (08/19/2022)   Overall Financial Resource Strain (CARDIA)    Difficulty of Paying Living Expenses: Not hard at all  Food Insecurity: No Food Insecurity (08/19/2022)   Hunger Vital Sign    Worried About Running Out of Food in the Last Year: Never true    Ran Out of Food in the Last Year: Never true  Transportation Needs: No Transportation Needs (08/19/2022)   PRAPARE - Administrator, Civil Service (Medical): No    Lack of Transportation (Non-Medical): No  Physical Activity: Unknown (08/19/2022)   Exercise Vital Sign    Days of Exercise per Week: 0 days    Minutes of Exercise per Session: Not on file  Stress: Stress Concern Present (08/19/2022)   [employer] of Occupational Health - Occupational Stress Questionnaire    Feeling of Stress : To some extent  Social Connections: Moderately Integrated (08/19/2022)   Social Connection and Isolation Panel [NHANES]    Frequency of Communication with Friends and Family: Never    Frequency of Social Gatherings with Friends and Family: Once a week    Attends Religious Services: More than 4 times per year    Active Member of [employer] or Organizations: Yes    Attends Banker Meetings: More than 4 times per year    Marital Status: Married     Allergies:   Allergies  Allergen Reactions   Catfish [Fish Allergy] Shortness Of Breath   Fish-Derived Products Shortness Of Breath   Banana Hives   Citric Acid Hives   Flavoring Agent Other (See Comments)     Mouth sores Headache   Monosodium Glutamate Other (See Comments)    Headache    Pineapple Hives   Promethazine Hcl Other (See Comments)    Restless motion   Tomato Other (See Comments)   Strawberry (Diagnostic) Other (See Comments)   Amoxicillin Other (See Comments)    Stomach pain    Metabolic Disorder Labs: Lab Results  Component Value Date   HGBA1C 5.2 03/08/2014   No results found for: "PROLACTIN" Lab Results  Component Value Date   CHOL  166 02/09/2021   TRIG 74 02/09/2021   HDL 47 (L) 02/09/2021   CHOLHDL 3.5 02/09/2021   VLDL 28.4 03/08/2014   LDLCALC 103 (H) 02/09/2021   LDLCALC 85 02/11/2020   Lab Results  Component Value Date   TSH 0.72 07/22/2022    Therapeutic Level Labs: No results found for: "LITHIUM" No results found for: "CBMZ" No results found for: "VALPROATE"  Current Medications: Current Outpatient Medications  Medication Sig Dispense Refill   albuterol (VENTOLIN HFA) 108 (90 Base) MCG/ACT inhaler INHALE 2 PUFFS INTO THE LUNGS EVERY 4 (FOUR) HOURS AS NEEDED FOR WHEEZING. 8 g 1   amitriptyline (ELAVIL) 25 MG tablet TAKE 1 TABLET BY MOUTH EVERYDAY AT BEDTIME 90 tablet 0   BIOTIN PO Take by mouth.     diclofenac sodium (VOLTAREN) 1 % GEL Apply 4 g topically 4 (four) times daily. To affected joint. 100 g 11   EMGALITY 120 MG/ML SOAJ Inject 1 mL into the skin every 30 (thirty) days.     fexofenadine (ALLEGRA) 180 MG tablet Take 180 mg by mouth daily.     fluticasone (FLONASE) 50 MCG/ACT nasal spray SPRAY 2 SPRAYS INTO EACH NOSTRIL EVERY DAY 16 mL 12   fluticasone-salmeterol (ADVAIR) 250-50 MCG/ACT AEPB Inhale 1 puff into the lungs every 12 (twelve) hours. 60 each 5   JUNEL 1/20 1-20 MG-MCG tablet TAKE 1 (ONE) TABLET DAILY CONTINUOUS ACTIVE PILLS 84 tablet 3   LORazepam (ATIVAN) 0.5 MG tablet TAKE 1 TABLET BY MOUTH EVERY DAY AS NEEDED FOR ANXIETY 30 tablet 0   meclizine (ANTIVERT) 25 MG tablet Take 1 tablet (25 mg total) by mouth 3 (three) times daily  as needed for dizziness. 30 tablet 2   Misc Natural Products (ELDERBERRY/VITAMIN C/ZINC PO) Take 1 tablet by mouth daily.     montelukast (SINGULAIR) 10 MG tablet TAKE 1 TABLET BY MOUTH EVERYDAY AT BEDTIME 30 tablet 5   omega-3 acid ethyl esters (LOVAZA) 1 g capsule Take by mouth 2 (two) times daily.     ondansetron (ZOFRAN-ODT) 4 MG disintegrating tablet Take 1 tablet (4 mg total) by mouth every 8 (eight) hours as needed for nausea or vomiting. 30 tablet 0   pantoprazole (PROTONIX) 40 MG tablet TAKE 1 TABLET BY MOUTH EVERY DAY 90 tablet 1   Probiotic Product (PROBIOTIC PO) Take 1 tablet by mouth daily.     UBRELVY 100 MG TABS Take by mouth.     No current facility-administered medications for this visit.     Psychiatric Specialty Exam: Review of Systems  Cardiovascular:  Negative for chest pain.  Neurological:  Negative for tremors.  Psychiatric/Behavioral:  Negative for hallucinations and self-injury.     There were no vitals taken for this visit.There is no height or weight on file to calculate BMI.  General Appearance: casual  Eye Contact:  fair  Speech:  Normal Rate  Volume:  Normal  Mood: fair  Affect:  Full Range  Thought Process:  Goal Directed  Orientation:  Full (Time, Place, and Person)  Thought Content:  Rumination  Suicidal Thoughts:  No  Homicidal Thoughts:  No  Memory:  Immediate;   Fair Recent;   Fair  Judgement:  Fair  Insight:  Fair  Psychomotor Activity:  Normal  Concentration:  Concentration: Good and Attention Span: Good  Recall:  Good  Fund of Knowledge:Good  Language: Good  Akathisia:  No  Handed:    AIMS (if indicated):  not done  Assets:  Communication Skills Desire for  Improvement Financial Resources/Insurance Housing  ADL's:  Intact  Cognition: WNL  Sleep:  Fair   Screenings: GAD-7    Flowsheet Row Office Visit from 05/09/2022 in [facility] & [facility] Medicine at [facility] Office Visit from 08/16/2021 in [facility] Primary Care & Sports Medicine at [facility] Office Visit from 11/19/2017 in [facility] Primary Care & Sports Medicine at [facility]  Total GAD-7 Score 0 0 3      PHQ2-9    Flowsheet Row Office Visit from 09/30/2022 in [facility] at [facility] Office Visit from 07/22/2022 in [facility] Primary Care & Sports Medicine at [facility] Office Visit from 05/09/2022 in [facility] Primary Care & Sports Medicine at [facility] Office Visit from 08/16/2021 in [facility] Primary Care & Sports Medicine at [facility] Office Visit from 02/09/2021 in [facility] Primary Care & Sports Medicine at [facility]  PHQ-2 Total Score 1 0 2 0 0  PHQ-9 Total Score -- -- 4 2 2       Flowsheet Row ED from 07/08/2022 in [facility] at [facility] ED from 06/29/2022 in [facility] at [facility] ED from 04/08/2022 in [facility] Care at [facility] RISK CATEGORY No Risk Error: Question 6 not populated No Risk       Assessment and Plan: as follows  Prior documentation reviewed    Panic attacks; sporadic but ativan helps   anxiety unspecified: manageable, had tiredness due to covid, recovering, continue ativan prn   Insomnia: reviewed sleep hygiene, continue imipramine Reviewed sleep hygiene   Fu 3 -63m.  Thresa Ross, MD 5/20/20242:16 PM

## 2022-11-02 ENCOUNTER — Other Ambulatory Visit: Payer: Self-pay

## 2022-11-02 ENCOUNTER — Ambulatory Visit
Admission: RE | Admit: 2022-11-02 | Discharge: 2022-11-02 | Disposition: A | Discharge status: Home / Self Care | Payer: 59 | Source: Ambulatory Visit | Attending: Family Medicine | Admitting: Family Medicine

## 2022-11-02 VITALS — BP 122/80 | HR 89 | Temp 98.8°F | Resp 18 | Ht 65.0 in | Wt 160.0 lb

## 2022-11-02 DIAGNOSIS — B370 Candidal stomatitis: Secondary | ICD-10-CM

## 2022-11-02 MED ORDER — FLUCONAZOLE 200 MG PO TABS: ORAL_TABLET | ORAL | 0 refills | Status: DC

## 2022-11-02 NOTE — Discharge Instructions (Addendum)
Advised patient to take medication as directed with food to completion.  Encouraged increase daily water intake to 64 ounces per day while taking this medication.  Advised patient to rinse mouth out for with warm water for several minutes or brush teeth following use of inhaler to avoid current symptoms.  Advised patient if symptoms worsen and/or unresolved please follow-up with PCP or here for further evaluation.

## 2022-11-02 NOTE — ED Provider Notes (Signed)
[facility]    CSN: [id number] Arrival date & time: 11/02/22  1149      History   Chief Complaint Chief Complaint  Patient presents with   Mouth Lesions    Oral thrush side effect of Wixela - Entered by patient    HPI Kelsey [name] is a 42 y.o. female.   HPI pleasant 42 year old female presents with possible thrush of her mouth.  Reports using Wixela inhaler on daily basis.  Reports hard palate or roof of mouth feels fuzzy; including tongue and back of throat. PMH significant for brain injury (post concussive syndrome), clotting disorder, seizure and panic attacks.  Past Medical History:  Diagnosis Date   Allergy    Anxiety and depression    Asthma    Brain injury (HCC) 2014   Clotting disorder (HCC) 02/17/2022   Depression    Endometriosis    Frequent headaches    GERD (gastroesophageal reflux disease)    Hx of varicella    IBS (irritable bowel syndrome)    Migraines    Panic attacks    Postpartum care following cesarean delivery (2/18) 07/08/2015   Seizure (HCC)    Vertigo 12/31/2013    Patient Active Problem List   Diagnosis Date Noted   Costochondritis 08/19/2022   Asthma 07/26/2022   Tachycardia 06/29/2022   Encounter for female birth control 06/01/2019   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   Arthralgia 06/09/2017   Anxiety 01/24/2016   Migraine without aura and without status migrainosus, not intractable 01/24/2016   Seasonal allergic rhinitis due to pollen 01/24/2016   Abnormal laboratory test 09/07/2014   Asthma, mild persistent 03/08/2014   GERD (gastroesophageal reflux disease) 03/08/2014   Seasonal allergies 03/08/2014   Irritable bowel syndrome with diarrhea 03/08/2014   Anxiety and depression 03/08/2014   Chronic daily headache 12/31/2013   Vertigo 12/31/2013   Postconcussive syndrome 12/31/2013    Past Surgical History:  Procedure Laterality Date   CESAREAN SECTION N/A 07/08/2015   Procedure: CESAREAN SECTION;  Surgeon: Noland Fordyce, MD;  Location: [facility];  Service: Obstetrics;  Laterality: N/A;   ESOPHAGOGASTRODUODENOSCOPY     [facility] 2009-2010   NO PAST SURGERIES      OB History     Gravida  1   Para  1   Term  1   Preterm      AB      Living  1      SAB      IAB      Ectopic      Multiple  0   Live Births  1            Home Medications    Prior to Admission medications   Medication Sig Start Date End Date Taking? Authorizing Provider  albuterol (VENTOLIN HFA) 108 (90 Base) MCG/ACT inhaler INHALE 2 PUFFS INTO THE LUNGS EVERY 4 (FOUR) HOURS AS NEEDED FOR WHEEZING. 11/16/21  Yes Christen Butter, NP  amitriptyline (ELAVIL) 25 MG tablet TAKE 1 TABLET BY MOUTH EVERYDAY AT BEDTIME 10/07/22  Yes Thresa Ross, MD  BIOTIN PO Take by mouth.   Yes [provider]  diclofenac sodium (VOLTAREN) 1 % GEL Apply 4 g topically 4 (four) times daily. To affected joint. 06/09/17  Yes Rodolph Bong, MD  EMGALITY 120 MG/ML SOAJ Inject 1 mL into the skin every 30 (thirty) days. 07/25/21  Yes [provider]  fexofenadine (ALLEGRA) 180 MG tablet Take 180 mg by mouth daily.  Yes [provider]  fluconazole (DIFLUCAN) 200 MG tablet Take 1 tab p.o. daily x 5 days 11/02/22  Yes Trevor Iha, FNP  fluticasone [facility]) 50 MCG/ACT nasal spray SPRAY 2 SPRAYS INTO EACH NOSTRIL EVERY DAY 12/30/18  Yes Rodolph Bong, MD  fluticasone-salmeterol (ADVAIR) 250-50 MCG/ACT AEPB Inhale 1 puff into the lungs every 12 (twelve) hours. 08/14/22  Yes Charlott Holler, MD  JUNEL 1/20 1-20 MG-MCG tablet TAKE 1 (ONE) TABLET DAILY CONTINUOUS ACTIVE PILLS 02/14/22  Yes Jessup, Joy, NP  LORazepam (ATIVAN) 0.5 MG tablet TAKE 1 TABLET BY MOUTH EVERY DAY AS NEEDED FOR ANXIETY 10/07/22  Yes Thresa Ross, MD  meclizine (ANTIVERT) 25 MG tablet Take 1 tablet (25 mg total) by mouth 3 (three) times daily as needed for dizziness. 08/22/21  Yes Christen Butter, NP  Misc Natural Products (ELDERBERRY/VITAMIN C/ZINC PO)  Take 1 tablet by mouth daily.   Yes [provider]  montelukast (SINGULAIR) 10 MG tablet TAKE 1 TABLET BY MOUTH EVERYDAY AT BEDTIME 09/24/22  Yes Jessup, Joy, NP  omega-3 acid ethyl esters (LOVAZA) 1 g capsule Take by mouth 2 (two) times daily.   Yes [provider]  ondansetron (ZOFRAN-ODT) 4 MG disintegrating tablet Take 1 tablet (4 mg total) by mouth every 8 (eight) hours as needed for nausea or vomiting. 05/09/22  Yes Christen Butter, NP  pantoprazole (PROTONIX) 40 MG tablet TAKE 1 TABLET BY MOUTH EVERY DAY 05/14/22  Yes Christen Butter, NP  Probiotic Product (PROBIOTIC PO) Take 1 tablet by mouth daily.   Yes [provider]  UBRELVY 100 MG TABS Take by mouth. 11/22/21  Yes [provider]    Family History Family History  Problem Relation Age of Onset   Hypertension Mother    Cancer Mother        multiple myeloma   Diabetes Mother    Obesity Mother    Asthma Mother    Hypertension Father    Stroke Father    Cancer Maternal Grandmother        CLL   Hypertension Maternal Grandmother    Post-traumatic stress disorder Maternal Uncle    Crohn's disease Cousin        maternal grandfather sister daughter   Colon cancer Neg Hx    Esophageal cancer Neg Hx     Social History Social History   Tobacco Use   Smoking status: Never    Passive exposure: Past   Smokeless tobacco: Never  Vaping Use   Vaping Use: Never used  Substance Use Topics   Alcohol use: No   Drug use: No     Allergies   Catfish [fish allergy], Fish-derived products, Banana, Citric acid, Flavoring agent, Monosodium glutamate, Pineapple, Promethazine hcl, Tomato, Strawberry (diagnostic), and Amoxicillin   Review of Systems Review of Systems  HENT:  Positive for mouth sores.        Concern for possible thrush  All other systems reviewed and are negative.    Physical Exam Triage Vital Signs ED Triage Vitals  Enc Vitals Group     BP 11/02/22 1207 122/80     Pulse Rate  11/02/22 1207 89     Resp 11/02/22 1207 18     Temp 11/02/22 1207 98.8 F (37.1 C)     Temp Source 11/02/22 1207 Oral     SpO2 11/02/22 1207 99 %     Weight 11/02/22 1209 160 lb (72.6 kg)     Height 11/02/22 1209 5\' 5"  (1.651 m)  Head Circumference --      Peak Flow --      Pain Score 11/02/22 1208 0     Pain Loc --      Pain Edu? --      Excl. in GC? --    No data found.  Updated Vital Signs BP 122/80 (BP Location: Right Arm)   Pulse 89   Temp 98.8 F (37.1 C) (Oral)   Resp 18   Ht 5\' 5"  (1.651 m)   Wt 160 lb (72.6 kg)   SpO2 99%   BMI 26.63 kg/m    Physical Exam Vitals and nursing note reviewed.  Constitutional:      Appearance: Normal appearance. She is normal weight.  HENT:     Head: Normocephalic and atraumatic.     Mouth/Throat:     Mouth: Mucous membranes are moist.     Pharynx: Oropharynx is clear.     Comments: Upper mandible/tongue: Creamy white patches noted Eyes:     Extraocular Movements: Extraocular movements intact.     Conjunctiva/sclera: Conjunctivae normal.     Pupils: Pupils are equal, round, and reactive to light.  Cardiovascular:     Rate and Rhythm: Normal rate and regular rhythm.     Pulses: Normal pulses.     Heart sounds: Normal heart sounds.  Pulmonary:     Effort: Pulmonary effort is normal.     Breath sounds: Normal breath sounds. No wheezing, rhonchi or rales.  Musculoskeletal:        General: Normal range of motion.     Cervical back: Normal range of motion and neck supple.  Skin:    General: Skin is warm and dry.  Neurological:     General: No focal deficit present.     Mental Status: She is alert and oriented to person, place, and time. Mental status is at baseline.  Psychiatric:        Mood and Affect: Mood normal.        Behavior: Behavior normal.        Thought Content: Thought content normal.      UC Treatments / Results  Labs (all labs ordered are listed, but only abnormal results are displayed) Labs  Reviewed - No data to display  EKG   Radiology No results found.  Procedures Procedures (including critical care time)  Medications Ordered in UC Medications - No data to display  Initial Impression / Assessment and Plan / UC Course  I have reviewed the triage vital signs and the nursing notes.  Pertinent labs & imaging results that were available during my care of the patient were reviewed by me and considered in my medical decision making (see chart for details).     MDM: 1.  Oral candidiasis-Rx'd Diflucan 200 mg tablet daily x 5 days. Advised patient to take medication as directed with food to completion.  Encouraged increase daily water intake to 64 ounces per day while taking this medication.  Advised patient to rinse mouth out for with warm water for several minutes or brush teeth following use of inhaler to avoid current symptoms.  Advised patient if symptoms worsen and/or unresolved please follow-up with PCP or here for further evaluation.  Patient discharged home, hemodynamically stable. Final Clinical Impressions(s) / UC Diagnoses   Final diagnoses:  Oral candidiasis     Discharge Instructions      Advised patient to take medication as directed with food to completion.  Encouraged increase daily water intake to 64  ounces per day while taking this medication.  Advised patient to rinse mouth out for with warm water for several minutes or brush teeth following use of inhaler to avoid current symptoms.  Advised patient if symptoms worsen and/or unresolved please follow-up with PCP or here for further evaluation.     ED Prescriptions     Medication Sig Dispense Auth. Provider   fluconazole (DIFLUCAN) 200 MG tablet Take 1 tab p.o. daily x 5 days 5 tablet Trevor Iha, FNP      PDMP not reviewed this encounter.   Trevor Iha, FNP 11/02/22 1526

## 2022-11-02 NOTE — ED Triage Notes (Signed)
Patient c/o possible "thrush" in her mouth, which is a side effect from the inhaler patient is currently using.  It feels fuzzy on the top roof of her mouth, along with her tongue and back of throat.

## 2022-11-05 ENCOUNTER — Encounter: Payer: Self-pay | Admitting: Family Medicine

## 2022-11-05 ENCOUNTER — Ambulatory Visit (INDEPENDENT_AMBULATORY_CARE_PROVIDER_SITE_OTHER): Payer: 59 | Admitting: Family Medicine

## 2022-11-05 VITALS — BP 122/77 | HR 110 | Resp 18 | Ht 65.0 in | Wt 160.8 lb

## 2022-11-05 DIAGNOSIS — N644 Mastodynia: Secondary | ICD-10-CM | POA: Diagnosis not present

## 2022-11-05 DIAGNOSIS — B370 Candidal stomatitis: Secondary | ICD-10-CM | POA: Diagnosis not present

## 2022-11-05 DIAGNOSIS — J4530 Mild persistent asthma, uncomplicated: Secondary | ICD-10-CM

## 2022-11-05 MED ORDER — NYSTATIN 100000 UNIT/ML MT SUSP: 5.0000 mL | Freq: Four times a day (QID) | OROMUCOSAL | 0 refills | Status: AC

## 2022-11-05 MED ORDER — TRELEGY ELLIPTA 100-62.5-25 MCG/ACT IN AEPB: 1.0000 | INHALATION_SPRAY | Freq: Every day | RESPIRATORY_TRACT | 3 refills | Status: DC

## 2022-11-05 NOTE — Assessment & Plan Note (Signed)
-   breast exam normal on exam. Since patient is having pain I have gone ahead and ordered a diagnostic mammogram

## 2022-11-05 NOTE — Assessment & Plan Note (Signed)
-   recommended she rinse and brush teeth after using inhaler  - given patient nystatin swish and spit to try - continue fluconazole

## 2022-11-05 NOTE — Progress Notes (Signed)
Established patient visit   Patient: Kelsey [name]   DOB: Oct 17, 1980   42 y.o. Female  MRN: [id number] Visit Date: 11/05/2022  Today's healthcare provider: Charlton Amor, DO   Chief Complaint  Patient presents with   Medication Reaction    Pt is coming in to possibly change inhaler provided in urgent care and is causing thrush    SUBJECTIVE    Chief Complaint  Patient presents with   Medication Reaction    Pt is coming in to possibly change inhaler provided in urgent care and is causing thrush   HPI HPI     Medication Reaction    Additional comments: Pt is coming in to possibly change inhaler provided in urgent care and is causing thrush      Last edited by Roselyn Reef, CMA on 11/05/2022 10:08 AM.      Pt presents for discussion about inhaler use. Believes her [place] is causing her oral candidiasis. She was recently seen in UC for this and given instructions to rinse her mouth out with and brush teeth post inhalation as well as given diflucan 200mg  for 5 days.   B/l breast pain - pt has noted b/l breast pain for the past year. She denies any nipple discharge. Said the pain disappeared and then came back.  Review of Systems  Constitutional:  Negative for activity change, fatigue and fever.  Respiratory:  Negative for cough and shortness of breath.   Cardiovascular:  Negative for chest pain.  Gastrointestinal:  Negative for abdominal pain.  Genitourinary:  Negative for difficulty urinating.       Current Meds  Medication Sig   albuterol (VENTOLIN HFA) 108 (90 Base) MCG/ACT inhaler INHALE 2 PUFFS INTO THE LUNGS EVERY 4 (FOUR) HOURS AS NEEDED FOR WHEEZING.   amitriptyline (ELAVIL) 25 MG tablet TAKE 1 TABLET BY MOUTH EVERYDAY AT BEDTIME   BIOTIN PO Take by mouth.   diclofenac sodium (VOLTAREN) 1 % GEL Apply 4 g topically 4 (four) times daily. To affected joint.   EMGALITY 120 MG/ML SOAJ Inject 1 mL into the skin every 30 (thirty) days.   fexofenadine  (ALLEGRA) 180 MG tablet Take 180 mg by mouth daily.   fluconazole (DIFLUCAN) 200 MG tablet Take 1 tab p.o. daily x 5 days   fluticasone (FLONASE) 50 MCG/ACT nasal spray SPRAY 2 SPRAYS INTO EACH NOSTRIL EVERY DAY   Fluticasone-Umeclidin-Vilant (TRELEGY ELLIPTA) 100-62.5-25 MCG/ACT AEPB Inhale 1 puff into the lungs daily.   JUNEL 1/20 1-20 MG-MCG tablet TAKE 1 (ONE) TABLET DAILY CONTINUOUS ACTIVE PILLS   LORazepam (ATIVAN) 0.5 MG tablet TAKE 1 TABLET BY MOUTH EVERY DAY AS NEEDED FOR ANXIETY   meclizine (ANTIVERT) 25 MG tablet Take 1 tablet (25 mg total) by mouth 3 (three) times daily as needed for dizziness.   Misc Natural Products (ELDERBERRY/VITAMIN C/ZINC PO) Take 1 tablet by mouth daily.   montelukast (SINGULAIR) 10 MG tablet TAKE 1 TABLET BY MOUTH EVERYDAY AT BEDTIME   nystatin (MYCOSTATIN) 100000 UNIT/ML suspension Take 5 mLs (500,000 Units total) by mouth 4 (four) times daily for 5 days. Swish and spit   omega-3 acid ethyl esters (LOVAZA) 1 g capsule Take by mouth 2 (two) times daily.   ondansetron (ZOFRAN-ODT) 4 MG disintegrating tablet Take 1 tablet (4 mg total) by mouth every 8 (eight) hours as needed for nausea or vomiting.   pantoprazole (PROTONIX) 40 MG tablet TAKE 1 TABLET BY MOUTH EVERY DAY   Probiotic Product (PROBIOTIC PO) Take 1  tablet by mouth daily.   UBRELVY 100 MG TABS Take by mouth.   [DISCONTINUED] fluticasone-salmeterol (ADVAIR) 250-50 MCG/ACT AEPB Inhale 1 puff into the lungs every 12 (twelve) hours.    OBJECTIVE    BP 122/77 (BP Location: Right Arm, Patient Position: Sitting, Cuff Size: Normal)   Pulse (!) 110 Comment: pt states every since covid her pulse runs high, she has seen a cardiologist for this  Resp 18   Ht 5\' 5"  (1.651 m)   Wt 160 lb 12 oz (72.9 kg)   SpO2 100%   BMI 26.75 kg/m   Physical Exam Vitals and nursing note reviewed.  Constitutional:      General: She is not in acute distress.    Appearance: Normal appearance.  HENT:     Head:  Normocephalic and atraumatic.     Right Ear: External ear normal.     Left Ear: External ear normal.     Nose: Nose normal.  Eyes:     Conjunctiva/sclera: Conjunctivae normal.  Cardiovascular:     Rate and Rhythm: Normal rate and regular rhythm.  Pulmonary:     Effort: Pulmonary effort is normal.     Breath sounds: Normal breath sounds.     Comments: Breast exam done with chaperone present. No nipple changes or discharge present. No lumps/rash or changes noted in either breast Neurological:     General: No focal deficit present.     Mental Status: She is alert and oriented to person, place, and time.  Psychiatric:        Mood and Affect: Mood normal.        Behavior: Behavior normal.        Thought Content: Thought content normal.        Judgment: Judgment normal.       ASSESSMENT & PLAN    Problem List Items Addressed This Visit       Respiratory   Asthma, mild persistent    - discussed that we can try switching inhalers. Stop wixela and start trelegy  - likely a side effect of her previous inhaler - recommended follow up with pulmonology      Relevant Medications   Fluticasone-Umeclidin-Vilant (TRELEGY ELLIPTA) 100-62.5-25 MCG/ACT AEPB     Digestive   Oral candidiasis - Primary    - recommended she rinse and brush teeth after using inhaler  - given patient nystatin swish and spit to try - continue fluconazole       Relevant Medications   nystatin (MYCOSTATIN) 100000 UNIT/ML suspension     Other   Breast pain    - breast exam normal on exam. Since patient is having pain I have gone ahead and ordered a diagnostic mammogram       Relevant Orders   MM Digital Diagnostic Bilat    Return in about 3 months (around 02/05/2023) for with PCP.      Meds ordered this encounter  Medications   Fluticasone-Umeclidin-Vilant (TRELEGY ELLIPTA) 100-62.5-25 MCG/ACT AEPB    Sig: Inhale 1 puff into the lungs daily.    Dispense:  1 each    Refill:  3   nystatin  (MYCOSTATIN) 100000 UNIT/ML suspension    Sig: Take 5 mLs (500,000 Units total) by mouth 4 (four) times daily for 5 days. Swish and spit    Dispense:  60 mL    Refill:  0    Orders Placed This Encounter  Procedures   MM Digital Diagnostic Bilat    Standing Status:  Future    Standing Expiration Date:   11/05/2023    Order Specific Question:   Reason for Exam (SYMPTOM  OR DIAGNOSIS REQUIRED)    Answer:   b/l breast pain    Order Specific Question:   Is the patient pregnant?    Answer:   No    Order Specific Question:   Preferred imaging location?    Answer:   [facility]     Charlton Amor, DO  [facility] Primary Care & Sports Medicine at [facility] [phone] (phone) [phone] (fax)  [facility]

## 2022-11-05 NOTE — Assessment & Plan Note (Addendum)
-   discussed that we can try switching inhalers. Stop wixela and start trelegy  - likely a side effect of her previous inhaler - recommended follow up with pulmonology

## 2022-11-07 ENCOUNTER — Other Ambulatory Visit: Payer: Self-pay | Admitting: Medical-Surgical

## 2022-11-08 ENCOUNTER — Encounter: Payer: Self-pay | Admitting: Internal Medicine

## 2022-12-04 ENCOUNTER — Other Ambulatory Visit: Payer: Self-pay | Admitting: Family Medicine

## 2022-12-04 DIAGNOSIS — N644 Mastodynia: Secondary | ICD-10-CM

## 2022-12-05 ENCOUNTER — Other Ambulatory Visit: Payer: Self-pay | Admitting: Family Medicine

## 2022-12-05 DIAGNOSIS — N644 Mastodynia: Secondary | ICD-10-CM

## 2022-12-23 ENCOUNTER — Ambulatory Visit: Payer: 59

## 2022-12-23 ENCOUNTER — Ambulatory Visit
Admission: RE | Admit: 2022-12-23 | Discharge: 2022-12-23 | Disposition: A | Discharge status: Home / Self Care | Payer: 59 | Source: Ambulatory Visit | Attending: Family Medicine | Admitting: Family Medicine

## 2022-12-23 ENCOUNTER — Other Ambulatory Visit: Payer: 59

## 2022-12-23 DIAGNOSIS — N644 Mastodynia: Secondary | ICD-10-CM

## 2022-12-31 ENCOUNTER — Ambulatory Visit (INDEPENDENT_AMBULATORY_CARE_PROVIDER_SITE_OTHER): Payer: 59 | Admitting: Medical-Surgical

## 2022-12-31 ENCOUNTER — Encounter: Payer: Self-pay | Admitting: Medical-Surgical

## 2022-12-31 VITALS — BP 108/73 | HR 97 | Ht 65.0 in | Wt 165.1 lb

## 2022-12-31 DIAGNOSIS — R002 Palpitations: Secondary | ICD-10-CM | POA: Diagnosis not present

## 2022-12-31 DIAGNOSIS — R110 Nausea: Secondary | ICD-10-CM | POA: Diagnosis not present

## 2022-12-31 DIAGNOSIS — R1084 Generalized abdominal pain: Secondary | ICD-10-CM | POA: Diagnosis not present

## 2022-12-31 DIAGNOSIS — M940 Chondrocostal junction syndrome [Tietze]: Secondary | ICD-10-CM | POA: Diagnosis not present

## 2022-12-31 DIAGNOSIS — J4530 Mild persistent asthma, uncomplicated: Secondary | ICD-10-CM

## 2022-12-31 DIAGNOSIS — R21 Rash and other nonspecific skin eruption: Secondary | ICD-10-CM

## 2022-12-31 MED ORDER — TRIAMCINOLONE ACETONIDE 0.1 % EX CREA: 1.0000 | TOPICAL_CREAM | Freq: Two times a day (BID) | CUTANEOUS | 0 refills | Status: AC

## 2022-12-31 MED ORDER — MONTELUKAST SODIUM 10 MG PO TABS: 10.0000 mg | ORAL_TABLET | Freq: Every day | ORAL | 3 refills | Status: DC

## 2022-12-31 NOTE — Progress Notes (Unsigned)
        Established patient visit  History, exam, impression, and plan:  Primary female hypogonadism Up-to-date testosterone labs.  Due for his next testosterone injection.  Doing well on his current regimen with stable vitals.  Testosterone cypionate 100 mg IM given x 1 in the office.  Due for next shot in 2 weeks.  Lump of skin of lower extremity, left Noted a lump on the left posterior calf approximately 1 month ago.  Initially, had redness, tenderness, swelling at the area however this has fully resolved.  No longer has any residual symptoms but the lump has remained.  His wife noticed it and urged him to be seen to evaluate it.  History notable for varicose veins, compliant with recommendations for compression socks daily.  No recent injury or trauma to the area.  Unclear etiology.  The lump is approximately 1 cm x 1.5 cm and slightly discolored.  See clinical photo.  Plan to get vascular ultrasound as it does seem to be connected to one of the varicose veins that runs through the area.  Suspect superficial thrombophlebitis initially.  Discussed conservative measures with Tylenol, heat, ice, and compression socks.    Procedures performed this visit: None.  Return in about 2 weeks (around 08/28/2022) for nurse visit for testosterone shot.  __________________________________ Joy L. Jessup, DNP, APRN, FNP-BC Primary Care and Sports Medicine [facility] [place]  

## 2023-01-01 ENCOUNTER — Other Ambulatory Visit: Payer: 59

## 2023-01-04 ENCOUNTER — Ambulatory Visit: Payer: 59

## 2023-01-04 VITALS — BP 112/75 | HR 98 | Temp 98.8°F | Resp 18 | Ht 65.0 in | Wt 165.0 lb

## 2023-01-04 DIAGNOSIS — R6889 Other general symptoms and signs: Secondary | ICD-10-CM

## 2023-01-04 LAB — POCT INFLUENZA A/B
Influenza A, POC: NEGATIVE
Influenza B, POC: NEGATIVE

## 2023-01-04 LAB — POC SARS CORONAVIRUS 2 AG -  ED: SARS Coronavirus 2 Ag: NEGATIVE

## 2023-01-04 NOTE — ED Triage Notes (Signed)
Patient c/o nasal drainage, itchy eyes, head congestion, body aches, low grade fever x 2 days.  Patient requesting flu and covid tests.  Patient has taken Tylenol and Allegra for sx's.

## 2023-01-04 NOTE — Discharge Instructions (Signed)
Push fluids Take OTC cough and cold medications Call for problems

## 2023-01-04 NOTE — ED Provider Notes (Signed)
[facility]    CSN: [id number] Arrival date & time: 01/04/23  0856      History   Chief Complaint Chief Complaint  Patient presents with   Influenza    Need a flu test and Covid test. Sudden onset of flu like symptoms at 2pm Thursday. - Entered by patient    HPI Kelsey [name] is a 42 y.o. female.   Patient states she had the sudden onset of flu symptoms with headache, body ache, cough and congestion on Thursday.  It is now Saturday and she feels no better.  She is requesting flu and COVID testing.  No one else at home is sick    Past Medical History:  Diagnosis Date   Allergy    Anxiety and depression    Asthma    Brain injury (HCC) 2014   Clotting disorder (HCC) 02/17/2022   Depression    Endometriosis    Frequent headaches    GERD (gastroesophageal reflux disease)    Hx of varicella    IBS (irritable bowel syndrome)    Migraines    Panic attacks    Postpartum care following cesarean delivery (2/18) 07/08/2015   Seizure (HCC)    Vertigo 12/31/2013    Patient Active Problem List   Diagnosis Date Noted   Oral candidiasis 11/05/2022   Breast pain 11/05/2022   Costochondritis 08/19/2022   Asthma 07/26/2022   Tachycardia 06/29/2022   Encounter for female birth control 06/01/2019   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   Arthralgia 06/09/2017   Anxiety 01/24/2016   Migraine without aura and without status migrainosus, not intractable 01/24/2016   Seasonal allergic rhinitis due to pollen 01/24/2016   Abnormal laboratory test 09/07/2014   Asthma, mild persistent 03/08/2014   GERD (gastroesophageal reflux disease) 03/08/2014   Seasonal allergies 03/08/2014   Irritable bowel syndrome with diarrhea 03/08/2014   Anxiety and depression 03/08/2014   Chronic daily headache 12/31/2013   Vertigo 12/31/2013   Postconcussive syndrome 12/31/2013    Past Surgical History:  Procedure Laterality Date   CESAREAN SECTION N/A 07/08/2015   Procedure: CESAREAN  SECTION;  Surgeon: Noland Fordyce, MD;  Location: [facility];  Service: Obstetrics;  Laterality: N/A;   ESOPHAGOGASTRODUODENOSCOPY     [facility] 2009-2010   NO PAST SURGERIES      OB History     Gravida  1   Para  1   Term  1   Preterm      AB      Living  1      SAB      IAB      Ectopic      Multiple  0   Live Births  1            Home Medications    Prior to Admission medications   Medication Sig Start Date End Date Taking? Authorizing Provider  albuterol (VENTOLIN HFA) 108 (90 Base) MCG/ACT inhaler INHALE 2 PUFFS INTO THE LUNGS EVERY 4 (FOUR) HOURS AS NEEDED FOR WHEEZING. 11/16/21  Yes Christen Butter, NP  amitriptyline (ELAVIL) 25 MG tablet TAKE 1 TABLET BY MOUTH EVERYDAY AT BEDTIME 10/07/22  Yes Thresa Ross, MD  BIOTIN PO Take by mouth.   Yes [provider]  diclofenac sodium (VOLTAREN) 1 % GEL Apply 4 g topically 4 (four) times daily. To affected joint. 06/09/17  Yes Rodolph Bong, MD  EMGALITY 120 MG/ML SOAJ Inject 1 mL into the skin every 30 (thirty) days. 07/25/21  Yes [provider]  fexofenadine (ALLEGRA) 180 MG tablet Take 180 mg by mouth daily.   Yes [provider]  fluconazole (DIFLUCAN) 200 MG tablet Take 1 tab p.o. daily x 5 days 11/02/22  Yes Trevor Iha, FNP  fluticasone [facility]) 50 MCG/ACT nasal spray SPRAY 2 SPRAYS INTO EACH NOSTRIL EVERY DAY 12/30/18  Yes Rodolph Bong, MD  JUNEL 1/20 1-20 MG-MCG tablet TAKE 1 (ONE) TABLET DAILY CONTINUOUS ACTIVE PILLS 02/14/22  Yes Jessup, Joy, NP  LORazepam (ATIVAN) 0.5 MG tablet TAKE 1 TABLET BY MOUTH EVERY DAY AS NEEDED FOR ANXIETY 10/07/22  Yes Thresa Ross, MD  meclizine (ANTIVERT) 25 MG tablet Take 1 tablet (25 mg total) by mouth 3 (three) times daily as needed for dizziness. 08/22/21  Yes Christen Butter, NP  Misc Natural Products (ELDERBERRY/VITAMIN C/ZINC PO) Take 1 tablet by mouth daily.   Yes [provider]  montelukast (SINGULAIR) 10 MG tablet Take 1 tablet  (10 mg total) by mouth at bedtime. 12/31/22  Yes Christen Butter, NP  omega-3 acid ethyl esters (LOVAZA) 1 g capsule Take by mouth 2 (two) times daily.   Yes [provider]  ondansetron (ZOFRAN-ODT) 4 MG disintegrating tablet Take 1 tablet (4 mg total) by mouth every 8 (eight) hours as needed for nausea or vomiting. 05/09/22  Yes Christen Butter, NP  Probiotic Product (PROBIOTIC PO) Take 1 tablet by mouth daily.   Yes [provider]  triamcinolone cream (KENALOG) 0.1 % Apply 1 Application topically 2 (two) times daily. 12/31/22  Yes Jessup, Joy, NP  UBRELVY 100 MG TABS Take by mouth. 11/22/21  Yes [provider]    Family History Family History  Problem Relation Age of Onset   Hypertension Mother    Cancer Mother        multiple myeloma   Diabetes Mother    Obesity Mother    Asthma Mother    Hypertension Father    Stroke Father    Cancer Maternal Grandmother        CLL   Hypertension Maternal Grandmother    Post-traumatic stress disorder Maternal Uncle    Crohn's disease Cousin        maternal grandfather sister daughter   Colon cancer Neg Hx    Esophageal cancer Neg Hx     Social History Social History   Tobacco Use   Smoking status: Never    Passive exposure: Past   Smokeless tobacco: Never  Vaping Use   Vaping status: Never Used  Substance Use Topics   Alcohol use: No   Drug use: No     Allergies   Catfish [fish allergy], Fish-derived products, Banana, Citric acid, Flavoring agent, Monosodium glutamate, Pineapple, Promethazine hcl, Tomato, Strawberry (diagnostic), and Amoxicillin   Review of Systems Review of Systems See HPI  Physical Exam Triage Vital Signs ED Triage Vitals  Encounter Vitals Group     BP 01/04/23 0913 112/75     Systolic BP Percentile --      Diastolic BP Percentile --      Pulse Rate 01/04/23 0913 98     Resp 01/04/23 0913 18     Temp 01/04/23 0913 98.8 F (37.1 C)     Temp Source 01/04/23 0913 Oral     SpO2  01/04/23 0913 100 %     Weight 01/04/23 0914 165 lb (74.8 kg)     Height 01/04/23 0914 5\' 5"  (1.651 m)     Head Circumference --  Peak Flow --      Pain Score 01/04/23 0914 2     Pain Loc --      Pain Education --      Exclude from Growth Chart --    No data found.  Updated Vital Signs BP 112/75 (BP Location: Right Arm)   Pulse 98   Temp 98.8 F (37.1 C) (Oral)   Resp 18   Ht 5\' 5"  (1.651 m)   Wt 74.8 kg   SpO2 100%   BMI 27.46 kg/m       Physical Exam Constitutional:      General: She is not in acute distress.    Appearance: Normal appearance. She is well-developed.  HENT:     Head: Normocephalic and atraumatic.     Right Ear: Tympanic membrane normal.     Left Ear: Tympanic membrane and ear canal normal.     Nose: No congestion.     Mouth/Throat:     Pharynx: No posterior oropharyngeal erythema.  Eyes:     Conjunctiva/sclera: Conjunctivae normal.     Pupils: Pupils are equal, round, and reactive to light.  Cardiovascular:     Rate and Rhythm: Normal rate and regular rhythm.     Heart sounds: Normal heart sounds.  Pulmonary:     Effort: Pulmonary effort is normal. No respiratory distress.     Breath sounds: Normal breath sounds.  Abdominal:     General: There is no distension.     Palpations: Abdomen is soft.  Musculoskeletal:        General: Normal range of motion.     Cervical back: Normal range of motion.  Skin:    General: Skin is warm and dry.  Neurological:     Mental Status: She is alert.      UC Treatments / Results  Labs (all labs ordered are listed, but only abnormal results are displayed) Labs Reviewed  POCT INFLUENZA A/B  POC SARS CORONAVIRUS 2 AG -  ED    EKG   Radiology No results found.  Procedures Procedures (including critical care time)  Medications Ordered in UC Medications - No data to display  Initial Impression / Assessment and Plan / UC Course  I have reviewed the triage vital signs and the nursing  notes.  Pertinent labs & imaging results that were available during my care of the patient were reviewed by me and considered in my medical decision making (see chart for details).     Flu and COVID test are negative.  Viral syndrome discussed Final Clinical Impressions(s) / UC Diagnoses   Final diagnoses:  Flu-like symptoms     Discharge Instructions      Push fluids Take OTC cough and cold medications Call for problems   ED Prescriptions   None    PDMP not reviewed this encounter.   Eustace Moore, MD 01/04/23 [phone]

## 2023-01-15 ENCOUNTER — Other Ambulatory Visit: Payer: Self-pay | Admitting: Medical-Surgical

## 2023-01-15 DIAGNOSIS — Z30019 Encounter for initial prescription of contraceptives, unspecified: Secondary | ICD-10-CM

## 2023-01-21 ENCOUNTER — Telehealth (HOSPITAL_COMMUNITY): Payer: Self-pay

## 2023-01-21 MED ORDER — AMITRIPTYLINE HCL 25 MG PO TABS: ORAL_TABLET | ORAL | 0 refills | Status: DC

## 2023-01-21 NOTE — Telephone Encounter (Signed)
Medication refill - Fax from pt's [facility] in [place] for a new Amitriptyline 25 mg order, last provided for a 90 day supply on 10/07/22 with no refills. Pt returns next on 01/27/23.

## 2023-01-22 ENCOUNTER — Ambulatory Visit (INDEPENDENT_AMBULATORY_CARE_PROVIDER_SITE_OTHER): Payer: 59 | Admitting: Internal Medicine

## 2023-01-22 DIAGNOSIS — J4540 Moderate persistent asthma, uncomplicated: Secondary | ICD-10-CM

## 2023-01-22 LAB — PULMONARY FUNCTION TEST
DL/VA % pred: 93 %
DL/VA: 4.11 ml/min/mmHg/L
DLCO cor % pred: 93 %
DLCO cor: 21.06 ml/min/mmHg
DLCO unc % pred: 93 %
DLCO unc: 21.06 ml/min/mmHg
FEF 25-75 Post: 2.32 L/s
FEF 25-75 Pre: 1.51 L/s
FEF2575-%Change-Post: 54 %
FEF2575-%Pred-Post: 72 %
FEF2575-%Pred-Pre: 47 %
FEV1-%Change-Post: 22 %
FEV1-%Pred-Post: 94 %
FEV1-%Pred-Pre: 77 %
FEV1-Post: 2.95 L
FEV1-Pre: 2.4 L
FEV1FVC-%Change-Post: 17 %
FEV1FVC-%Pred-Pre: 74 %
FEV6-%Change-Post: 3 %
FEV6-%Pred-Post: 106 %
FEV6-%Pred-Pre: 103 %
FEV6-Post: 4 L
FEV6-Pre: 3.88 L
FEV6FVC-%Change-Post: 0 %
FEV6FVC-%Pred-Post: 101 %
FEV6FVC-%Pred-Pre: 101 %
FVC-%Change-Post: 4 %
FVC-%Pred-Post: 107 %
FVC-%Pred-Pre: 102 %
FVC-Post: 4.12 L
FVC-Pre: 3.93 L
Post FEV1/FVC ratio: 72 %
Post FEV6/FVC ratio: 100 %
Pre FEV1/FVC ratio: 61 %
Pre FEV6/FVC Ratio: 100 %
RV % pred: 132 %
RV: 2.17 L
TLC % pred: 112 %
TLC: 5.88 L

## 2023-01-22 NOTE — Progress Notes (Signed)
Full PFT performed today. °

## 2023-01-22 NOTE — Patient Instructions (Signed)
Full PFT performed today. °

## 2023-01-23 ENCOUNTER — Ambulatory Visit (INDEPENDENT_AMBULATORY_CARE_PROVIDER_SITE_OTHER): Payer: 59 | Admitting: Internal Medicine

## 2023-01-23 ENCOUNTER — Encounter: Payer: Self-pay | Admitting: Internal Medicine

## 2023-01-23 VITALS — BP 120/72 | HR 91 | Ht 65.0 in | Wt 164.6 lb

## 2023-01-23 DIAGNOSIS — J4540 Moderate persistent asthma, uncomplicated: Secondary | ICD-10-CM

## 2023-01-23 MED ORDER — BUDESONIDE-FORMOTEROL FUMARATE 160-4.5 MCG/ACT IN AERO: 2.0000 | INHALATION_SPRAY | Freq: Two times a day (BID) | RESPIRATORY_TRACT | 12 refills | Status: DC

## 2023-01-23 MED ORDER — SPACER/AERO-HOLDING CHAMBERS DEVI: 2.0000 | Freq: Two times a day (BID) | 11 refills | Status: AC

## 2023-01-23 NOTE — Progress Notes (Signed)
Kelsey [name]    [id number]    10/12/1980  Primary Care Physician:Jessup, Ander Slade, NP Date of Appointment: 01/23/2023 Established Patient Visit  Chief complaint:   Chief Complaint  Patient presents with   Follow-up    Pft results, pt would like to discuss a new maintenance  inhaler.     HPI: Kelsey [name] is a 42 y.o. woman with moderate persistent asthma   Interval Updates: Here for follow up after PFTS which show mild airflow limitation with significant response to bronchodilator.   Recent viral illness last month treated symptomatically. Negative for covid and flu.  Had thrush with wixela. Her Pcp gave her trelegy but she didn't start it yet.   Feels as she gets further out from covid, her brain fog and dyspnea are somewhat improved.   Still needs a maintenance inhaler for ongoing albuterol use. Wants to try to get back into exercise.   Current Regimen: albuterol prn Asthma Triggers: rainy weather, exertion, URIs, anxiety Exacerbations in the last year: 4-5 requiring prednisone History of hospitalization or intubation: never been hospitalized but had ED visits as an adult last in 2007.  Allergy Testing: had SPT after 2007. Allergic to grasses, horses, cats.  GERD: yes, on protonix daily.  Allergic Rhinitis: on singulair and flonase for rhinitis with cetirizine.  ACT:  Asthma Control Test ACT Total Score  08/14/2022 10:42 AM 19   FeNO: 39 ppb Serum Eos/IgE: eos 87 in 01/2021,   I have reviewed the patient's family social and past medical history and updated as appropriate.   Past Medical History:  Diagnosis Date   Allergy    Anxiety and depression    Asthma    Brain injury (HCC) 2014   Clotting disorder (HCC) 02/17/2022   Depression    Endometriosis    Frequent headaches    GERD (gastroesophageal reflux disease)    Hx of varicella    IBS (irritable bowel syndrome)    Migraines    Panic attacks    Postpartum care following cesarean delivery  (2/18) 07/08/2015   Seizure (HCC)    Vertigo 12/31/2013    Past Surgical History:  Procedure Laterality Date   CESAREAN SECTION N/A 07/08/2015   Procedure: CESAREAN SECTION;  Surgeon: Noland Fordyce, MD;  Location: [facility];  Service: Obstetrics;  Laterality: N/A;   ESOPHAGOGASTRODUODENOSCOPY     [facility] 2009-2010   NO PAST SURGERIES      Family History  Problem Relation Age of Onset   Hypertension Mother    Cancer Mother        multiple myeloma   Diabetes Mother    Obesity Mother    Asthma Mother    Hypertension Father    Stroke Father    Cancer Maternal Grandmother        CLL   Hypertension Maternal Grandmother    Post-traumatic stress disorder Maternal Uncle    Crohn's disease Cousin        maternal grandfather sister daughter   Colon cancer Neg Hx    Esophageal cancer Neg Hx     Social History   Occupational History   Not on file  Tobacco Use   Smoking status: Never    Passive exposure: Past   Smokeless tobacco: Never  Vaping Use   Vaping status: Never Used  Substance and Sexual Activity   Alcohol use: No   Drug use: No   Sexual activity: Yes    Partners: Male  Birth control/protection: Pill     Physical Exam: Blood pressure 120/72, pulse 91, height 5\' 5"  (1.651 m), weight 164 lb 9.6 oz (74.7 kg), SpO2 100%.  Gen:      No acute distress ENT:  no thrush, no nasal polyps, mucus membranes moist Lungs:    No increased respiratory effort, symmetric chest wall excursion, clear to auscultation bilaterally, no wheezes or crackles CV:         Regular rate and rhythm; no murmurs, rubs, or gallops.  No pedal edema   Data Reviewed: Imaging: I have personally reviewed the   PFTs:     Latest Ref Rng & Units 01/22/2023    3:35 PM  PFT Results  FVC-Pre L 3.93  P  FVC-Predicted Pre % 102  P  FVC-Post L 4.12  P  FVC-Predicted Post % 107  P  Pre FEV1/FVC % % 61  P  Post FEV1/FCV % % 72  P  FEV1-Pre L 2.40  P  FEV1-Predicted Pre % 77  P   FEV1-Post L 2.95  P  DLCO uncorrected ml/min/mmHg 21.06  P  DLCO UNC% % 93  P  DLCO corrected ml/min/mmHg 21.06  P  DLCO COR %Predicted % 93  P  DLVA Predicted % 93  P  TLC L 5.88  P  TLC % Predicted % 112  P  RV % Predicted % 132  P    P Preliminary result   I have personally reviewed the patient's PFTs and mild airflow limtiation with +BD response, hyperinflation and air trapping.   Labs: Lab Results  Component Value Date   NA 139 02/09/2021   K 4.4 02/09/2021   CO2 25 02/09/2021   GLUCOSE 68 02/09/2021   BUN 13 02/09/2021   CREATININE 0.99 (H) 02/09/2021   CALCIUM 9.6 02/09/2021   GFR 89.17 03/22/2014   EGFR 75 02/09/2021   GFRNONAA 71 02/11/2020   Lab Results  Component Value Date   WBC 3.8 02/09/2021   HGB 14.7 02/09/2021   HCT 43.8 02/09/2021   MCV 87.8 02/09/2021   PLT 268 02/09/2021     Immunization status: Immunization History  Administered Date(s) Administered   COVID-19, mRNA, vaccine(Comirnaty)12 years and older 05/14/2022   Hepatitis A 04/23/2006, 04/02/2007   Hepatitis B, ADULT 10/26/2010, 08/20/2011   IPV 04/02/2007   Influenza,inj,Quad PF,6+ Mos 03/08/2014, 01/14/2019, 02/08/2020, 02/09/2021, 05/09/2022   Influenza-Unspecified 02/17/2017   MMR 03/03/1984, 07/20/1996   Meningococcal polysaccharide vaccine (MPSV4) 11/17/2000   Moderna Sars-Covid-2 Vaccination 09/10/2019, 10/08/2019   OPV 01/16/1988   PFIZER Comirnaty(Gray Top)Covid-19 Tri-Sucrose Vaccine 05/14/2022   PPD Test 11/19/2000   Td 08/22/2003   Tdap 03/08/2014, 09/25/2019   Typhoid Parenteral 04/23/2006    External Records Personally Reviewed: urgent care  Assessment:  Moderate persistent asthma, not well controlled Allergic rhinitis Adverse reaction to ICS  Plan/Recommendations:  Take the albuterol rescue inhaler every 4 to 6 hours as needed for wheezing or shortness of breath. You can also take it 15 minutes before exercise or exertional activity.  Switch to symbicort  160 inhaler 2 puffs twice daily with spacer, gargle after use.   We gave you the flu shot today.    Return to Care: Return in about 4 months (around 05/25/2023).   Durel Salts, MD Pulmonary and Critical Care Medicine [facility] Office:[phone]

## 2023-01-23 NOTE — Patient Instructions (Addendum)
It was a pleasure to see you today!  Please schedule follow up scheduled with myself in 4 months.  If my schedule is not open yet, we will contact you with a reminder closer to that time. Please call [phone] if you haven't heard from [place] a month before, and always call us sooner if issues or concerns arise. You can also send [place] a message through MyChart, but but aware that this is not to be used for urgent issues and it may take up to 5-7 days to receive a reply.   Take the albuterol rescue inhaler every 4 to 6 hours as needed for wheezing or shortness of breath. You can also take it 15 minutes before exercise or exertional activity. Side effects include heart racing or pounding, jitters or anxiety. If you have a history of an irregular heart rhythm, it can make this worse. Can also give some patients a hard time sleeping.  Switch to symbicort 160 inhaler 2 puffs twice daily with spacer, gargle after use.   We gave you the flu shot today.

## 2023-01-23 NOTE — Progress Notes (Signed)
 The patient has been prescribed the inhaler symbicort with spacer. Inhaler technique was demonstrated to patient. The patient subsequently demonstrated correct technique.

## 2023-01-27 ENCOUNTER — Encounter (HOSPITAL_COMMUNITY): Payer: Self-pay | Admitting: Psychiatry

## 2023-01-27 ENCOUNTER — Telehealth (INDEPENDENT_AMBULATORY_CARE_PROVIDER_SITE_OTHER): Payer: 59 | Admitting: Psychiatry

## 2023-01-27 DIAGNOSIS — F410 Panic disorder [episodic paroxysmal anxiety] without agoraphobia: Secondary | ICD-10-CM

## 2023-01-27 DIAGNOSIS — F5102 Adjustment insomnia: Secondary | ICD-10-CM | POA: Diagnosis not present

## 2023-01-27 DIAGNOSIS — F419 Anxiety disorder, unspecified: Secondary | ICD-10-CM | POA: Diagnosis not present

## 2023-01-27 MED ORDER — LORAZEPAM 0.5 MG PO TABS: ORAL_TABLET | ORAL | 0 refills | Status: DC

## 2023-01-27 NOTE — Progress Notes (Signed)
[facility] Follow up visit  Patient Identification: Kelsey [name] MRN:  [id number] Date of Evaluation:  01/27/2023 Referral Source: GI  Chief Complaint:  follow up sleep, anxiety Visit Diagnosis:    ICD-10-CM   1. Anxiety disorder, unspecified type  F41.9     2. Panic attacks  F41.0     3. Adjustment insomnia  F51.02      Virtual Visit via Video Note  I connected with Kelsey [name] on 01/27/23 at 10:30 AM EDT by a video enabled telemedicine application and verified that I am speaking with the correct person using two identifiers.  Location: Patient: home Provider: home office   I discussed the limitations of evaluation and management by telemedicine and the availability of in person appointments. The patient expressed understanding and agreed to proceed.      I discussed the assessment and treatment plan with the patient. The patient was provided an opportunity to ask questions and all were answered. The patient agreed with the plan and demonstrated an understanding of the instructions.   The patient was advised to call back or seek an in-person evaluation if the symptoms worsen or if the condition fails to improve as anticipated.  I provided 20 minutes of non-face-to-face time during this encounter.     History of Present Illness: Patient is a 42 years old currently married African-American female referred by GI doctor for establishment of care for possible anxiety.  Patient has been suffering from irritable bowel syndrome She also has a head injury in the past leading to migraines and now is currently on disability since 2015  She is doing fair, tolerating meds, anxiety is better  Still takes prn ativan helps with presentation and at sleep if increase anxiety    Aggravating  factor: presentations,  past head injury, relationship can be, long covid Modifying factor:  family Severity manageable  Duration more so last few years   Past Psychiatric History:  anxiety  Consequences of Substance Abuse: NA  Past Medical History:  Past Medical History:  Diagnosis Date   Allergy    Anxiety and depression    Asthma    Brain injury (HCC) 2014   Clotting disorder (HCC) 02/17/2022   Depression    Endometriosis    Frequent headaches    GERD (gastroesophageal reflux disease)    Hx of varicella    IBS (irritable bowel syndrome)    Migraines    Panic attacks    Postpartum care following cesarean delivery (2/18) 07/08/2015   Seizure (HCC)    Vertigo 12/31/2013    Past Surgical History:  Procedure Laterality Date   CESAREAN SECTION N/A 07/08/2015   Procedure: CESAREAN SECTION;  Surgeon: Noland Fordyce, MD;  Location: [facility];  Service: Obstetrics;  Laterality: N/A;   ESOPHAGOGASTRODUODENOSCOPY     [facility] 2009-2010   NO PAST SURGERIES      Family Psychiatric History: Mom: OCD possibel  Family History:  Family History  Problem Relation Age of Onset   Hypertension Mother    Cancer Mother        multiple myeloma   Diabetes Mother    Obesity Mother    Asthma Mother    Hypertension Father    Stroke Father    Cancer Maternal Grandmother        CLL   Hypertension Maternal Grandmother    Post-traumatic stress disorder Maternal Uncle    Crohn's disease Cousin        maternal grandfather sister daughter  Colon cancer Neg Hx    Esophageal cancer Neg Hx     Social History:   Social History   Socioeconomic History   Marital status: Married    Spouse name: Not on file   Number of children: 0   Years of education: Not on file   Highest education level: Master's degree (e.g., MA, MS, MEng, MEd, MSW, MBA)  Occupational History   Not on file  Tobacco Use   Smoking status: Never    Passive exposure: Past   Smokeless tobacco: Never  Vaping Use   Vaping status: Never Used  Substance and Sexual Activity   Alcohol use: No   Drug use: No   Sexual activity: Yes    Partners: Male    Birth control/protection: Pill  Other  Topics Concern   Not on file  Social History Narrative   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      Home Situation: lives with husband      Spiritual Beliefs: Christian      Lifestyle: doing 20 minutes of exercise and a little weight lifting daily; diet is healthy            Social Determinants of Health   Financial Resource Strain: Low Risk  (08/19/2022)   Overall Financial Resource Strain (CARDIA)    Difficulty of Paying Living Expenses: Not hard at all  Food Insecurity: No Food Insecurity (08/19/2022)   Hunger Vital Sign    Worried About Running Out of Food in the Last Year: Never true    Ran Out of Food in the Last Year: Never true  Transportation Needs: No Transportation Needs (08/19/2022)   PRAPARE - Administrator, Civil Service (Medical): No    Lack of Transportation (Non-Medical): No  Physical Activity: Unknown (08/19/2022)   Exercise Vital Sign    Days of Exercise per Week: 0 days    Minutes of Exercise per Session: Not on file  Stress: Stress Concern Present (08/19/2022)   [employer] of Occupational Health - Occupational Stress Questionnaire    Feeling of Stress : To some extent  Social Connections: Moderately Integrated (08/19/2022)   Social Connection and Isolation Panel [NHANES]    Frequency of Communication with Friends and Family: Never    Frequency of Social Gatherings with Friends and Family: Once a week    Attends Religious Services: More than 4 times per year    Active Member of [employer] or Organizations: Yes    Attends Banker Meetings: More than 4 times per year    Marital Status: Married     Allergies:   Allergies  Allergen Reactions   Catfish [Fish Allergy] Shortness Of Breath   Fish-Derived Products Shortness Of Breath   Banana Hives   Citric Acid Hives   Flavoring Agent Other (See Comments)    Mouth sores Headache   Monosodium Glutamate Other (See Comments)    Headache    Pineapple Hives   Promethazine Hcl  Other (See Comments)    Restless motion   Tomato Other (See Comments)   Strawberry (Diagnostic) Other (See Comments)   Amoxicillin Other (See Comments)    Stomach pain    Metabolic Disorder Labs: Lab Results  Component Value Date   HGBA1C 5.2 03/08/2014   No results found for: "PROLACTIN" Lab Results  Component Value Date   CHOL 166 02/09/2021   TRIG 74 02/09/2021   HDL 47 (L) 02/09/2021   CHOLHDL 3.5 02/09/2021   VLDL  11.4 03/08/2014   LDLCALC 103 (H) 02/09/2021   LDLCALC 85 02/11/2020   Lab Results  Component Value Date   TSH 0.72 07/22/2022    Therapeutic Level Labs: No results found for: "LITHIUM" No results found for: "CBMZ" No results found for: "VALPROATE"  Current Medications: Current Outpatient Medications  Medication Sig Dispense Refill   albuterol (VENTOLIN HFA) 108 (90 Base) MCG/ACT inhaler INHALE 2 PUFFS INTO THE LUNGS EVERY 4 (FOUR) HOURS AS NEEDED FOR WHEEZING. 8 g 1   amitriptyline (ELAVIL) 25 MG tablet TAKE 1 TABLET BY MOUTH EVERYDAY AT BEDTIME 90 tablet 0   BIOTIN PO Take by mouth.     budesonide-formoterol (SYMBICORT) 160-4.5 MCG/ACT inhaler Inhale 2 puffs into the lungs in the morning and at bedtime. 1 each 12   diclofenac sodium (VOLTAREN) 1 % GEL Apply 4 g topically 4 (four) times daily. To affected joint. 100 g 11   EMGALITY 120 MG/ML SOAJ Inject 1 mL into the skin every 30 (thirty) days.     fexofenadine (ALLEGRA) 180 MG tablet Take 180 mg by mouth daily.     fluconazole (DIFLUCAN) 200 MG tablet Take 1 tab p.o. daily x 5 days 5 tablet 0   fluticasone (FLONASE) 50 MCG/ACT nasal spray SPRAY 2 SPRAYS INTO EACH NOSTRIL EVERY DAY 16 mL 12   JUNEL 1/20 1-20 MG-MCG tablet TAKE 1 (ONE) TABLET DAILY CONTINUOUS ACTIVE PILLS 84 tablet 0   LORazepam (ATIVAN) 0.5 MG tablet TAKE 1 TABLET BY MOUTH EVERY DAY AS NEEDED FOR ANXIETY 30 tablet 0   meclizine (ANTIVERT) 25 MG tablet Take 1 tablet (25 mg total) by mouth 3 (three) times daily as needed for dizziness.  30 tablet 2   Misc Natural Products (ELDERBERRY/VITAMIN C/ZINC PO) Take 1 tablet by mouth daily.     montelukast (SINGULAIR) 10 MG tablet Take 1 tablet (10 mg total) by mouth at bedtime. 90 tablet 3   omega-3 acid ethyl esters (LOVAZA) 1 g capsule Take by mouth 2 (two) times daily.     ondansetron (ZOFRAN-ODT) 4 MG disintegrating tablet Take 1 tablet (4 mg total) by mouth every 8 (eight) hours as needed for nausea or vomiting. 30 tablet 0   Probiotic Product (PROBIOTIC PO) Take 1 tablet by mouth daily.     Spacer/Aero-Holding Chambers DEVI 2 puffs by Does not apply route in the morning and at bedtime. 1 each 11   triamcinolone cream (KENALOG) 0.1 % Apply 1 Application topically 2 (two) times daily. 30 g 0   UBRELVY 100 MG TABS Take by mouth.     No current facility-administered medications for this visit.     Psychiatric Specialty Exam: Review of Systems  Cardiovascular:  Negative for chest pain.  Neurological:  Negative for tremors.  Psychiatric/Behavioral:  Negative for hallucinations and self-injury.     There were no vitals taken for this visit.There is no height or weight on file to calculate BMI.  General Appearance: casual  Eye Contact:  fair  Speech:  Normal Rate  Volume:  Normal  Mood: fair  Affect:  Full Range  Thought Process:  Goal Directed  Orientation:  Full (Time, Place, and Person)  Thought Content:  Rumination  Suicidal Thoughts:  No  Homicidal Thoughts:  No  Memory:  Immediate;   Fair Recent;   Fair  Judgement:  Fair  Insight:  Fair  Psychomotor Activity:  Normal  Concentration:  Concentration: Good and Attention Span: Good  Recall:  Good  Fund of Knowledge:Good  Language:  Good  Akathisia:  No  Handed:    AIMS (if indicated):  not done  Assets:  Communication Skills Desire for Improvement Financial Resources/Insurance Housing  ADL's:  Intact  Cognition: WNL  Sleep:  Fair   Screenings: GAD-7    Flowsheet Row Office Visit from 05/09/2022 in [facility] Medicine at [facility] Office Visit from 08/16/2021 in [facility] at [facility] Office Visit from 11/19/2017 in [facility] Primary Care & Sports Medicine at [facility]  Total GAD-7 Score 0 0 3      PHQ2-9    Flowsheet Row Office Visit from 12/31/2022 in [facility] Primary Care & Sports Medicine at [facility] Office Visit from 09/30/2022 in [facility] Primary Care & Sports Medicine at [facility] Office Visit from 07/22/2022 in [facility] Primary Care & Sports Medicine at [facility] Office Visit from 05/09/2022 in [facility] Care & [facility] Medicine at [facility] Office Visit from 08/16/2021 in [facility] Primary Care & Sports Medicine at [facility]  PHQ-2 Total Score 0 1 0 2 0  PHQ-9 Total Score -- -- -- 4 2      Flowsheet Row ED from 01/04/2023 in [facility] ED from 11/02/2022 in [facility] at [facility] ED from 07/08/2022 in [facility] at [facility] RISK CATEGORY No Risk No Risk No Risk       Assessment and Plan: as follows  Prior documentation reviewed    Panic attacks; sporadic manageable with ativan   anxiety unspecified: doing fair, ellavil helps also ativan prn  Insomnia: manageable, reviewed sleep hyginee, continue ellavil also ativan prn at night  Fu 25m.  Thresa Ross, MD 9/9/202410:50 AM

## 2023-02-05 ENCOUNTER — Encounter: Payer: Self-pay | Admitting: Internal Medicine

## 2023-02-11 ENCOUNTER — Ambulatory Visit (INDEPENDENT_AMBULATORY_CARE_PROVIDER_SITE_OTHER): Payer: 59 | Admitting: Gastroenterology

## 2023-02-11 ENCOUNTER — Encounter: Payer: Self-pay | Admitting: Gastroenterology

## 2023-02-11 ENCOUNTER — Other Ambulatory Visit (INDEPENDENT_AMBULATORY_CARE_PROVIDER_SITE_OTHER): Payer: 59

## 2023-02-11 VITALS — BP 118/80 | HR 78 | Ht 65.0 in | Wt 166.0 lb

## 2023-02-11 DIAGNOSIS — R7401 Elevation of levels of liver transaminase levels: Secondary | ICD-10-CM

## 2023-02-11 DIAGNOSIS — R140 Abdominal distension (gaseous): Secondary | ICD-10-CM

## 2023-02-11 DIAGNOSIS — K449 Diaphragmatic hernia without obstruction or gangrene: Secondary | ICD-10-CM

## 2023-02-11 DIAGNOSIS — K582 Mixed irritable bowel syndrome: Secondary | ICD-10-CM

## 2023-02-11 DIAGNOSIS — K219 Gastro-esophageal reflux disease without esophagitis: Secondary | ICD-10-CM | POA: Diagnosis not present

## 2023-02-11 LAB — HEPATIC FUNCTION PANEL
ALT: 12 U/L (ref 0–35)
AST: 13 U/L (ref 0–37)
Albumin: 4.3 g/dL (ref 3.5–5.2)
Alkaline Phosphatase: 45 U/L (ref 39–117)
Bilirubin, Direct: 0.1 mg/dL (ref 0.0–0.3)
Total Bilirubin: 0.4 mg/dL (ref 0.2–1.2)
Total Protein: 7.5 g/dL (ref 6.0–8.3)

## 2023-02-11 NOTE — Patient Instructions (Addendum)
_______________________________________________________  If your blood pressure at your visit was 140/90 or greater, please contact your primary care physician to follow up on this.  _______________________________________________________  If you are age 40 or older, your body mass index should be between 23-30. Your Body mass index is 27.62 kg/m. If this is out of the aforementioned range listed, please consider follow up with your Primary Care Provider.  If you are age 16 or younger, your body mass index should be between 19-25. Your Body mass index is 27.62 kg/m. If this is out of the aformentioned range listed, please consider follow up with your Primary Care Provider.   ________________________________________________________  The  GI providers would like to encourage you to use [facility] to communicate with providers for non-urgent requests or questions.  Due to long hold times on the telephone, sending your provider a message by [facility] may be a faster and more efficient way to get a response.  Please allow 48 business hours for a response.  Please remember that this is for non-urgent requests.  _______________________________________________________  Your provider has requested that you go to the basement level for lab work before leaving today. Press "B" on the elevator. The lab is located at the first door on the left as you exit the elevator.  You have been scheduled for an endoscopy. Please follow written instructions given to you at your visit today.  If you use inhalers (even only as needed), please bring them with you on the day of your procedure.  If you take any of the following medications, they will need to be adjusted prior to your procedure:   DO NOT TAKE 7 DAYS PRIOR TO TEST- Trulicity (dulaglutide) Ozempic, Wegovy (semaglutide) Mounjaro (tirzepatide) Bydureon Bcise (exanatide extended release)  DO NOT TAKE 1 DAY PRIOR TO YOUR TEST Rybelsus  (semaglutide) Adlyxin (lixisenatide) Victoza (liraglutide) Byetta (exanatide) ___________________________________________________________________________   It was a pleasure to see you today!  Vito Cirigliano, D.O.

## 2023-02-11 NOTE — Progress Notes (Signed)
Chief Complaint:    Abdominal bloating  GI History: 42 year old female with a history of anxiety/depression, endometriosis, GERD, moderate asthma, allergic rhinitis, migraines, panic attacks, history of seizures, IBS, follows in the GI clinic for the following:  1) IBS-D. diagnosed with IBS-D in 2009, started on Elavil with good control. Reports sxs recurred after going back out in public after pandemic shut downs. Worse with being in public places, public speaking, travel. Described as increased urgency, frequency. No pain.  Occasional +nausea without emesis. No hematochezia.  Will have associated panic attacks- shaking hands, palpitations/racing heart, facial flushing.  Endoscopic History: - 2009: EGD at [facility] only notable for hiatal hernia per patient  HPI:     Patient is a 42 y.o. female presenting to the Gastroenterology Clinic for evaluation of abdominal bloating.  Was previously seen by me 1 time on 06/16/2020 for IBS-D exacerbated by anxiety and agoraphobia.  Was referred to [facility].  Has not had follow-up in GI clinic since then.  Main issue today is abdominal bloating over the last few months. Has tried some dietary modifications to include decreased gluten intake (improvement) and diet diary. Sxs worse with apples, acidic foods.  Not sure if this started after COVID earlier this year.  Separately with reflux sxs. Has been taking Protonix 40 mg daily with dinner, and sxs had been controlled, but more recently with breakthrough sxs. Index sxs of heartburn, regurgitation. Previously treated with Omeprazole but eventually lost efficacy. Reflux typically post prandial. Worse with onions.   Has had constipation, which she states has been present "for decades", although was previously seen for long history of IBS-D as outlined above. More recently started Miralax once/week and fiber gummies w/ good response.  Now with soft stools and no straining.  No hematochezia, melena.   For  her anxiety and panic attacks, she follows closely in the Psychiatry clinic, last seen 01/27/2023.  Treated with Elavil along with Ativan on demand.  Was seen in the [facility] earlier this year for evaluation of palpitations.  Normal TTE in 08/2022.  Did have COVID infection earlier this year as well.  No recent abdominal imaging for review.  Reviewed labs from earlier this year when diagnosed with Covid which showed normal iron indices, CBC, CMP (ALT 86).  Review of systems:     No chest pain, no SOB, no fevers, no urinary sx   Past Medical History:  Diagnosis Date   Allergy    Anxiety and depression    Asthma    Brain injury (HCC) 2014   Clotting disorder (HCC) 02/17/2022   Depression    Endometriosis    Frequent headaches    GERD (gastroesophageal reflux disease)    Hx of varicella    IBS (irritable bowel syndrome)    Migraines    Panic attacks    Postpartum care following cesarean delivery (2/18) 07/08/2015   Seizure (HCC)    Vertigo 12/31/2013    Patient's surgical history, family medical history, social history, medications and allergies were all reviewed in Epic    Current Outpatient Medications  Medication Sig Dispense Refill   albuterol (VENTOLIN HFA) 108 (90 Base) MCG/ACT inhaler INHALE 2 PUFFS INTO THE LUNGS EVERY 4 (FOUR) HOURS AS NEEDED FOR WHEEZING. 8 g 1   amitriptyline (ELAVIL) 25 MG tablet TAKE 1 TABLET BY MOUTH EVERYDAY AT BEDTIME 90 tablet 0   BIOTIN PO Take by mouth.     budesonide-formoterol (SYMBICORT) 160-4.5 MCG/ACT inhaler Inhale 2 puffs into the  lungs in the morning and at bedtime. 1 each 12   diclofenac sodium (VOLTAREN) 1 % GEL Apply 4 g topically 4 (four) times daily. To affected joint. 100 g 11   EMGALITY 120 MG/ML SOAJ Inject 1 mL into the skin every 30 (thirty) days.     fexofenadine (ALLEGRA) 180 MG tablet Take 180 mg by mouth daily.     fluconazole (DIFLUCAN) 200 MG tablet Take 1 tab p.o. daily x 5 days 5 tablet 0   fluticasone  (FLONASE) 50 MCG/ACT nasal spray SPRAY 2 SPRAYS INTO EACH NOSTRIL EVERY DAY 16 mL 12   JUNEL 1/20 1-20 MG-MCG tablet TAKE 1 (ONE) TABLET DAILY CONTINUOUS ACTIVE PILLS 84 tablet 0   LORazepam (ATIVAN) 0.5 MG tablet TAKE 1 TABLET BY MOUTH EVERY DAY AS NEEDED FOR ANXIETY 30 tablet 0   meclizine (ANTIVERT) 25 MG tablet Take 1 tablet (25 mg total) by mouth 3 (three) times daily as needed for dizziness. 30 tablet 2   Misc Natural Products (ELDERBERRY/VITAMIN C/ZINC PO) Take 1 tablet by mouth daily.     montelukast (SINGULAIR) 10 MG tablet Take 1 tablet (10 mg total) by mouth at bedtime. 90 tablet 3   omega-3 acid ethyl esters (LOVAZA) 1 g capsule Take by mouth 2 (two) times daily.     ondansetron (ZOFRAN-ODT) 4 MG disintegrating tablet Take 1 tablet (4 mg total) by mouth every 8 (eight) hours as needed for nausea or vomiting. 30 tablet 0   Probiotic Product (PROBIOTIC PO) Take 1 tablet by mouth daily.     Spacer/Aero-Holding Chambers DEVI 2 puffs by Does not apply route in the morning and at bedtime. 1 each 11   triamcinolone cream (KENALOG) 0.1 % Apply 1 Application topically 2 (two) times daily. 30 g 0   UBRELVY 100 MG TABS Take by mouth.     No current facility-administered medications for this visit.    Physical Exam:     BP 118/80   Pulse 78   Ht 5\' 5"  (1.651 m)   Wt 166 lb (75.3 kg)   BMI 27.62 kg/m   GENERAL:  Pleasant female in NAD PSYCH: : Cooperative, normal affect EENT:  conjunctiva pink, mucous membranes moist, neck supple without masses CARDIAC:  RRR, no murmur heard, no peripheral edema PULM: Normal respiratory effort, lungs CTA bilaterally, no wheezing ABDOMEN:  Nondistended, soft, nontender. No obvious masses, no hepatomegaly,  normal bowel sounds SKIN:  turgor, no lesions seen Musculoskeletal:  Normal muscle tone, normal strength NEURO: Alert and oriented x 3, no focal neurologic deficits   IMPRESSION and PLAN:    1) Abdominal bloating Discussed broad DDx for  abdominal bloating with patient at length today. - Check celiac serologies - EGD with random and directed biopsies to evaluate for mucosal and luminal pathology - Continue diet diary with avoidance of exacerbating foods.  Did recommend that she resume gluten containing foods though to try to increase diagnostic yield  2) GERD - Modify Protonix to take 30-60 minutes prior to dinnertime - Continue antireflux lifestyle/dietary modifications with avoidance of exacerbating foods - EGD to evaluate for erosive esophagitis, LES laxity, hiatal hernia  3) Change in bowel habits Previously seen for longstanding history of IBS-D.  More recently with constipation, although she reports this also being "lifelong".  More likely that she has IBS mixed type.  Discussed diagnosis at length today, including strong relationship between IBS and underlying anxiety. - Continue current therapy as this is working well - Would likely benefit from  low FODMAP diet -Continue fiber supplement  4) Elevated ALT Isolated elevated ALT, but in the setting of COVID earlier this year.  Otherwise normal liver enzymes - Repeat LFT panel now to ensure normalization of enzymes  5) Anxiety - Continue close follow-up in the Psychiatry clinic - Currently takes Elavil which has beneficial effect in IBS patients as well  6) Hiatal hernia Patient reports hiatal hernia on EGD in 2009.  Will assess hernia size/severity with repeat EGD as above  The indications, risks, and benefits of EGD were explained to the patient in detail. Risks include but are not limited to bleeding, perforation, adverse reaction to medications, and cardiopulmonary compromise. Sequelae include but are not limited to the possibility of surgery, hospitalization, and mortality. The patient verbalized understanding and wished to proceed. All questions answered, referred to scheduler. Further recommendations pending results of the exam.        Shellia Cleverly ,DO,  FACG 02/11/2023, 1:48 PM

## 2023-02-12 LAB — IGA: Immunoglobulin A: 101 mg/dL (ref 47–310)

## 2023-02-12 LAB — TISSUE TRANSGLUTAMINASE, IGA: (tTG) Ab, IgA: <1 U/mL

## 2023-02-19 ENCOUNTER — Encounter: Payer: Self-pay | Admitting: Medical-Surgical

## 2023-02-19 ENCOUNTER — Ambulatory Visit (INDEPENDENT_AMBULATORY_CARE_PROVIDER_SITE_OTHER): Payer: 59 | Admitting: Medical-Surgical

## 2023-02-19 VITALS — BP 96/67 | HR 101 | Resp 20 | Ht 65.0 in | Wt 165.7 lb

## 2023-02-19 DIAGNOSIS — Z1231 Encounter for screening mammogram for malignant neoplasm of breast: Secondary | ICD-10-CM | POA: Diagnosis not present

## 2023-02-19 DIAGNOSIS — E7800 Pure hypercholesterolemia, unspecified: Secondary | ICD-10-CM | POA: Diagnosis not present

## 2023-02-19 DIAGNOSIS — Z0000 Encounter for general adult medical examination without abnormal findings: Secondary | ICD-10-CM | POA: Diagnosis not present

## 2023-02-19 NOTE — Progress Notes (Signed)
Complete physical exam  Patient: Kelsey [name]   DOB: 28-Feb-1981   42 y.o. Female  MRN: [id number]  Subjective:    No chief complaint on file.  Emerlyn [name] is a 42 y.o. female who presents today for a complete physical exam. She reports consuming a general diet. The patient does not participate in regular exercise at present. She generally feels fairly well. She reports sleeping fairly well. She does not have additional problems to discuss today.    Most recent fall risk assessment:    12/31/2022    9:46 AM  Fall Risk   Falls in the past year? 1  Number falls in past yr: 0  Injury with Fall? 0  Follow up Falls evaluation completed     Most recent depression screenings:    12/31/2022    9:46 AM 09/30/2022    9:15 AM  PHQ 2/9 Scores  PHQ - 2 Score 0 1    Vision:Not within last year  and Dental: No current dental problems and Receives regular dental care    Patient Care Team: Christen Butter, NP as PCP - General (Nurse Practitioner) Noland Fordyce, MD as Consulting Physician (Obstetrics and Gynecology)   Outpatient Medications Prior to Visit  Medication Sig   albuterol (VENTOLIN HFA) 108 (90 Base) MCG/ACT inhaler INHALE 2 PUFFS INTO THE LUNGS EVERY 4 (FOUR) HOURS AS NEEDED FOR WHEEZING.   amitriptyline (ELAVIL) 25 MG tablet TAKE 1 TABLET BY MOUTH EVERYDAY AT BEDTIME   BIOTIN PO Take by mouth.   budesonide-formoterol (SYMBICORT) 160-4.5 MCG/ACT inhaler Inhale 2 puffs into the lungs in the morning and at bedtime.   diclofenac sodium (VOLTAREN) 1 % GEL Apply 4 g topically 4 (four) times daily. To affected joint.   EMGALITY 120 MG/ML SOAJ Inject 1 mL into the skin every 30 (thirty) days.   fexofenadine (ALLEGRA) 180 MG tablet Take 180 mg by mouth daily.   fluticasone (FLONASE) 50 MCG/ACT nasal spray SPRAY 2 SPRAYS INTO EACH NOSTRIL EVERY DAY   JUNEL 1/20 1-20 MG-MCG tablet TAKE 1 (ONE) TABLET DAILY CONTINUOUS ACTIVE PILLS   LORazepam (ATIVAN) 0.5 MG tablet TAKE 1 TABLET  BY MOUTH EVERY DAY AS NEEDED FOR ANXIETY   meclizine (ANTIVERT) 25 MG tablet Take 1 tablet (25 mg total) by mouth 3 (three) times daily as needed for dizziness.   Misc Natural Products (ELDERBERRY/VITAMIN C/ZINC PO) Take 1 tablet by mouth daily.   montelukast (SINGULAIR) 10 MG tablet Take 1 tablet (10 mg total) by mouth at bedtime.   omega-3 acid ethyl esters (LOVAZA) 1 g capsule Take by mouth 2 (two) times daily.   ondansetron (ZOFRAN-ODT) 4 MG disintegrating tablet Take 1 tablet (4 mg total) by mouth every 8 (eight) hours as needed for nausea or vomiting.   Probiotic Product (PROBIOTIC PO) Take 1 tablet by mouth daily.   Spacer/Aero-Holding Chambers DEVI 2 puffs by Does not apply route in the morning and at bedtime.   triamcinolone cream (KENALOG) 0.1 % Apply 1 Application topically 2 (two) times daily.   UBRELVY 100 MG TABS Take by mouth.   [DISCONTINUED] fluconazole (DIFLUCAN) 200 MG tablet Take 1 tab p.o. daily x 5 days   No facility-administered medications prior to visit.    Review of Systems  Constitutional:  Positive for malaise/fatigue. Negative for chills, fever and weight loss.  HENT:  Negative for congestion, ear pain, hearing loss, sinus pain and sore throat.   Eyes:  Negative for blurred vision, photophobia and pain.  Respiratory:  Negative for cough, shortness of breath and wheezing.   Cardiovascular:  Positive for palpitations. Negative for chest pain and leg swelling.  Gastrointestinal:  Negative for abdominal pain, constipation, diarrhea, heartburn, nausea and vomiting.  Genitourinary:  Negative for dysuria, frequency and urgency.  Musculoskeletal:  Negative for falls and neck pain.  Skin:  Negative for itching and rash.  Neurological:  Positive for headaches. Negative for dizziness and weakness.  Endo/Heme/Allergies:  Negative for polydipsia. Does not bruise/bleed easily.  Psychiatric/Behavioral:  Negative for depression, substance abuse and suicidal ideas. The patient  is not nervous/anxious and does not have insomnia.      Objective:    There were no vitals taken for this visit.   Physical Exam Constitutional:      General: She is not in acute distress.    Appearance: Normal appearance. She is not ill-appearing.  HENT:     Head: Normocephalic and atraumatic.     Right Ear: Tympanic membrane, ear canal and external ear normal. There is no impacted cerumen.     Left Ear: Tympanic membrane, ear canal and external ear normal. There is no impacted cerumen.     Nose: Nose normal. No congestion or rhinorrhea.     Mouth/Throat:     Mouth: Mucous membranes are moist.     Pharynx: No oropharyngeal exudate or posterior oropharyngeal erythema.  Eyes:     General: No scleral icterus.       Right eye: No discharge.        Left eye: No discharge.     Extraocular Movements: Extraocular movements intact.     Conjunctiva/sclera: Conjunctivae normal.     Pupils: Pupils are equal, round, and reactive to light.  Neck:     Thyroid: No thyromegaly.     Vascular: No carotid bruit or JVD.     Trachea: Trachea normal.  Cardiovascular:     Rate and Rhythm: Normal rate and regular rhythm.     Pulses: Normal pulses.     Heart sounds: Normal heart sounds. No murmur heard.    No friction rub. No gallop.  Pulmonary:     Effort: Pulmonary effort is normal. No respiratory distress.     Breath sounds: Normal breath sounds. No wheezing.  Abdominal:     General: Bowel sounds are normal. There is no distension.     Palpations: Abdomen is soft.     Tenderness: There is no abdominal tenderness. There is no guarding.  Musculoskeletal:        General: Normal range of motion.     Cervical back: Normal range of motion and neck supple.  Lymphadenopathy:     Cervical: No cervical adenopathy.  Skin:    General: Skin is warm and dry.  Neurological:     Mental Status: She is alert and oriented to person, place, and time.     Cranial Nerves: No cranial nerve deficit.   Psychiatric:        Mood and Affect: Mood normal.        Behavior: Behavior normal.        Thought Content: Thought content normal.        Judgment: Judgment normal.     No results found for any visits on 02/19/23.     Assessment & Plan:    Routine Health Maintenance and Physical Exam  Immunization History  Administered Date(s) Administered   Hepatitis A 04/23/2006, 04/02/2007   Hepatitis B, ADULT 10/26/2010, 08/20/2011   IPV 04/02/2007   Influenza,inj,Quad  PF,6+ Mos 03/08/2014, 01/14/2019, 02/08/2020, 02/09/2021, 05/09/2022   Influenza-Unspecified 02/17/2017   MMR 03/03/1984, 07/20/1996   Meningococcal polysaccharide vaccine (MPSV4) 11/17/2000   Moderna Sars-Covid-2 Vaccination 09/10/2019, 10/08/2019   OPV 01/16/1988   PPD Test 11/19/2000   Pfizer(Comirnaty)Fall Seasonal Vaccine 12 years and older 05/14/2022   Td 08/22/2003   Tdap 03/08/2014, 09/25/2019   Typhoid Parenteral 04/23/2006    Health Maintenance  Topic Date Due   INFLUENZA VACCINE  05/20/2023 (Originally 12/19/2022)   Cervical Cancer Screening (HPV/Pap Cotest)  02/09/2026   DTaP/Tdap/Td (4 - Td or Tdap) 09/24/2029   COVID-19 Vaccine  Completed   Hepatitis C Screening  Completed   HIV Screening  Completed   HPV VACCINES  Aged Out    Discussed health benefits of physical activity, and encouraged her to engage in regular exercise appropriate for her age and condition.  1. Annual physical exam Checking labs as below. UTD on preventative care. Wellness information provided with AVS. - CBC with Differential/Platelet - CMP14+EGFR  2. Encounter for screening mammogram for malignant neoplasm of breast Recent mammogram reviewed.  3. Elevated LDL cholesterol level Checking lipids today. - Lipid panel  Return in about 1 year (around 02/19/2024) for annual physical exam or sooner if needed.   Christen Butter, NP

## 2023-02-20 LAB — CBC WITH DIFFERENTIAL/PLATELET
Basophils Absolute: 0 10*3/uL (ref 0.0–0.2)
Basos: 0 %
EOS (ABSOLUTE): 0.1 10*3/uL (ref 0.0–0.4)
Eos: 2 %
Hematocrit: 43.2 % (ref 34.0–46.6)
Hemoglobin: 14.1 g/dL (ref 11.1–15.9)
Immature Grans (Abs): 0 10*3/uL (ref 0.0–0.1)
Immature Granulocytes: 0 %
Lymphocytes Absolute: 1.4 10*3/uL (ref 0.7–3.1)
Lymphs: 29 %
MCH: 28.8 pg (ref 26.6–33.0)
MCHC: 32.6 g/dL (ref 31.5–35.7)
MCV: 88 fL (ref 79–97)
Monocytes Absolute: 0.3 10*3/uL (ref 0.1–0.9)
Monocytes: 6 %
Neutrophils Absolute: 3 10*3/uL (ref 1.4–7.0)
Neutrophils: 63 %
Platelets: 270 10*3/uL (ref 150–450)
RBC: 4.89 x10E6/uL (ref 3.77–5.28)
RDW: 12.2 % (ref 11.7–15.4)
WBC: 4.7 10*3/uL (ref 3.4–10.8)

## 2023-02-20 LAB — LIPID PANEL
Chol/HDL Ratio: 3.5 {ratio} (ref 0.0–4.4)
Cholesterol, Total: 152 mg/dL (ref 100–199)
HDL: 43 mg/dL (ref >39)
LDL Chol Calc (NIH): 94 mg/dL (ref 0–99)
Triglycerides: 75 mg/dL (ref 0–149)
VLDL Cholesterol Cal: 15 mg/dL (ref 5–40)

## 2023-02-20 LAB — CMP14+EGFR
ALT: 13 [IU]/L (ref 0–32)
AST: 19 [IU]/L (ref 0–40)
Albumin: 4.1 g/dL (ref 3.9–4.9)
Alkaline Phosphatase: 48 [IU]/L (ref 44–121)
BUN/Creatinine Ratio: 11 (ref 9–23)
BUN: 11 mg/dL (ref 6–24)
Bilirubin Total: 0.5 mg/dL (ref 0.0–1.2)
CO2: 21 mmol/L (ref 20–29)
Calcium: 9.4 mg/dL (ref 8.7–10.2)
Chloride: 102 mmol/L (ref 96–106)
Creatinine, Ser: 1.04 mg/dL — ABNORMAL HIGH (ref 0.57–1.00)
Globulin, Total: 2.6 g/dL (ref 1.5–4.5)
Glucose: 64 mg/dL — ABNORMAL LOW (ref 70–99)
Potassium: 4 mmol/L (ref 3.5–5.2)
Sodium: 137 mmol/L (ref 134–144)
Total Protein: 6.7 g/dL (ref 6.0–8.5)
eGFR: 70 mL/min/{1.73_m2} (ref >59)

## 2023-03-04 ENCOUNTER — Encounter: Payer: Self-pay | Admitting: Gastroenterology

## 2023-03-15 ENCOUNTER — Encounter: Payer: Self-pay | Admitting: Certified Registered Nurse Anesthetist

## 2023-03-17 ENCOUNTER — Encounter: Payer: Self-pay | Admitting: Gastroenterology

## 2023-03-17 ENCOUNTER — Ambulatory Visit: Payer: 59 | Admitting: Gastroenterology

## 2023-03-17 VITALS — BP 148/98 | HR 106 | Temp 97.4°F | Resp 11 | Ht 65.0 in | Wt 166.0 lb

## 2023-03-17 DIAGNOSIS — R140 Abdominal distension (gaseous): Secondary | ICD-10-CM

## 2023-03-17 DIAGNOSIS — K317 Polyp of stomach and duodenum: Secondary | ICD-10-CM | POA: Diagnosis present

## 2023-03-17 DIAGNOSIS — K219 Gastro-esophageal reflux disease without esophagitis: Secondary | ICD-10-CM | POA: Diagnosis not present

## 2023-03-17 DIAGNOSIS — K2970 Gastritis, unspecified, without bleeding: Secondary | ICD-10-CM

## 2023-03-17 MED ORDER — SODIUM CHLORIDE 0.9 % IV SOLN: 500.0000 mL | INTRAVENOUS | Status: DC

## 2023-03-17 NOTE — Patient Instructions (Addendum)
Resume previous diet Continue present medications Await pathology results  Handouts/information given for polyps.  YOU HAD AN ENDOSCOPIC PROCEDURE TODAY AT THE [facility]:   Refer to the procedure report that was given to you for any specific questions about what was found during the examination.  If the procedure report does not answer your questions, please call your gastroenterologist to clarify.  If you requested that your care partner not be given the details of your procedure findings, then the procedure report has been included in a sealed envelope for you to review at your convenience later.  YOU SHOULD EXPECT: Some feelings of bloating in the abdomen. Passage of more gas than usual.  Walking can help get rid of the air that was put into your GI tract during the procedure and reduce the bloating. If you had a lower endoscopy (such as a colonoscopy or flexible sigmoidoscopy) you may notice spotting of blood in your stool or on the toilet paper. If you underwent a bowel prep for your procedure, you may not have a normal bowel movement for a few days.  Please Note:  You might notice some irritation and congestion in your nose or some drainage.  This is from the oxygen used during your procedure.  There is no need for concern and it should clear up in a day or so.  SYMPTOMS TO REPORT IMMEDIATELY:  Following lower endoscopy (colonoscopy or flexible sigmoidoscopy):  Excessive amounts of blood in the stool  Significant tenderness or worsening of abdominal pains  Swelling of the abdomen that is new, acute  Fever of 100F or higher  For urgent or emergent issues, a gastroenterologist can be reached at any hour by calling [phone]. Do not use MyChart messaging for urgent concerns.    DIET:  We do recommend a small meal at first, but then you may proceed to your regular diet.  Drink plenty of fluids but you should avoid alcoholic beverages for 24 hours.  ACTIVITY:  You  should plan to take it easy for the rest of today and you should NOT DRIVE or use heavy machinery until tomorrow (because of the sedation medicines used during the test).    FOLLOW UP: Our staff will call the number listed on your records the next business day following your procedure.  We will call around 7:15- 8:00 am to check on you and address any questions or concerns that you may have regarding the information given to you following your procedure. If we do not reach you, we will leave a message.     If any biopsies were taken you will be contacted by phone or by letter within the next 1-3 weeks.  Please call us at [phone] if you have not heard about the biopsies in 3 weeks.    SIGNATURES/CONFIDENTIALITY: You and/or your care partner have signed paperwork which will be entered into your electronic medical record.  These signatures attest to the fact that that the information above on your After Visit Summary has been reviewed and is understood.  Full responsibility of the confidentiality of this discharge information lies with you and/or your care-partner.

## 2023-03-17 NOTE — Progress Notes (Signed)
0929 Robinul 0.1 mg IV given due large amount of secretions upon assessment.  MD made aware, vss

## 2023-03-17 NOTE — Progress Notes (Signed)
Called to room to assist during endoscopic procedure.  Patient ID and intended procedure confirmed with present staff. Received instructions for my participation in the procedure from the performing physician.  

## 2023-03-17 NOTE — Progress Notes (Signed)
GASTROENTEROLOGY PROCEDURE H&P NOTE   Primary Care Physician: Christen Butter, NP    Reason for Procedure:   Bloating, GERD, hiatal hernia  Plan:    EGD  Patient is appropriate for endoscopic procedure(s) in the ambulatory ([facility]) setting.  The nature of the procedure, as well as the risks, benefits, and alternatives were carefully and thoroughly reviewed with the patient. Ample time for discussion and questions allowed. The patient understood, was satisfied, and agreed to proceed.     HPI: Kelsey [name] is a 42 y.o. female who presents for EGD for evaluation of bloating, GERD, and reported history of hiatal hernia. No significant changes in clinical history since last OV on 02/11/2023 with me.   Past Medical History:  Diagnosis Date   Allergy    Anxiety and depression    Asthma    Brain injury (HCC) 2014   Clotting disorder (HCC) 02/17/2022   Depression    Endometriosis    Frequent headaches    GERD (gastroesophageal reflux disease)    Hx of varicella    IBS (irritable bowel syndrome)    Migraines    Panic attacks    Postpartum care following cesarean delivery (2/18) 07/08/2015   Seizure (HCC)    Vertigo 12/31/2013    Past Surgical History:  Procedure Laterality Date   CESAREAN SECTION N/A 07/08/2015   Procedure: CESAREAN SECTION;  Surgeon: Noland Fordyce, MD;  Location: [facility];  Service: Obstetrics;  Laterality: N/A;   ESOPHAGOGASTRODUODENOSCOPY     [facility] [id number]   NO PAST SURGERIES      Prior to Admission medications   Medication Sig Start Date End Date Taking? Authorizing Provider  amitriptyline (ELAVIL) 25 MG tablet TAKE 1 TABLET BY MOUTH EVERYDAY AT BEDTIME 01/21/23  Yes Thresa Ross, MD  budesonide-formoterol (SYMBICORT) 160-4.5 MCG/ACT inhaler Inhale 2 puffs into the lungs in the morning and at bedtime. 01/23/23  Yes Charlott Holler, MD  fexofenadine (ALLEGRA) 180 MG tablet Take 180 mg by mouth daily.   Yes [provider]   fluticasone (FLONASE) 50 MCG/ACT nasal spray SPRAY 2 SPRAYS INTO EACH NOSTRIL EVERY DAY 12/30/18  Yes Rodolph Bong, MD  JUNEL 1/20 1-20 MG-MCG tablet TAKE 1 (ONE) TABLET DAILY CONTINUOUS ACTIVE PILLS 01/15/23  Yes Jessup, Joy, NP  LORazepam (ATIVAN) 0.5 MG tablet TAKE 1 TABLET BY MOUTH EVERY DAY AS NEEDED FOR ANXIETY 01/27/23  Yes Thresa Ross, MD  Misc Natural Products (ELDERBERRY/VITAMIN C/ZINC PO) Take 1 tablet by mouth daily.   Yes [provider]  montelukast (SINGULAIR) 10 MG tablet Take 1 tablet (10 mg total) by mouth at bedtime. 12/31/22  Yes Christen Butter, NP  omega-3 acid ethyl esters (LOVAZA) 1 g capsule Take by mouth 2 (two) times daily.   Yes [provider]  pantoprazole (PROTONIX) 40 MG tablet Take 40 mg by mouth daily. 03/15/23  Yes [provider]  UBRELVY 100 MG TABS Take by mouth. 11/22/21  Yes [provider]  albuterol (VENTOLIN HFA) 108 (90 Base) MCG/ACT inhaler INHALE 2 PUFFS INTO THE LUNGS EVERY 4 (FOUR) HOURS AS NEEDED FOR WHEEZING. 11/16/21   Christen Butter, NP  BIOTIN PO Take by mouth.    [provider]  diclofenac sodium (VOLTAREN) 1 % GEL Apply 4 g topically 4 (four) times daily. To affected joint. 06/09/17   Rodolph Bong, MD  EMGALITY 120 MG/ML SOAJ Inject 1 mL into the skin every 30 (thirty) days. 07/25/21   [provider]  meclizine (ANTIVERT) 25 MG tablet Take 1 tablet (25 mg total) by mouth 3 (three) times daily as needed for dizziness. 08/22/21   Christen Butter, NP  ondansetron (ZOFRAN-ODT) 4 MG disintegrating tablet Take 1 tablet (4 mg total) by mouth every 8 (eight) hours as needed for nausea or vomiting. 05/09/22   Christen Butter, NP  Probiotic Product (PROBIOTIC PO) Take 1 tablet by mouth daily.    [provider]  Spacer/Aero-Holding Chambers DEVI 2 puffs by Does not apply route in the morning and at bedtime. 01/23/23   Charlott Holler, MD  triamcinolone cream (KENALOG) 0.1 % Apply 1 Application topically 2 (two)  times daily. 12/31/22   Christen Butter, NP    Current Outpatient Medications  Medication Sig Dispense Refill   amitriptyline (ELAVIL) 25 MG tablet TAKE 1 TABLET BY MOUTH EVERYDAY AT BEDTIME 90 tablet 0   budesonide-formoterol (SYMBICORT) 160-4.5 MCG/ACT inhaler Inhale 2 puffs into the lungs in the morning and at bedtime. 1 each 12   fexofenadine (ALLEGRA) 180 MG tablet Take 180 mg by mouth daily.     fluticasone (FLONASE) 50 MCG/ACT nasal spray SPRAY 2 SPRAYS INTO EACH NOSTRIL EVERY DAY 16 mL 12   JUNEL 1/20 1-20 MG-MCG tablet TAKE 1 (ONE) TABLET DAILY CONTINUOUS ACTIVE PILLS 84 tablet 0   LORazepam (ATIVAN) 0.5 MG tablet TAKE 1 TABLET BY MOUTH EVERY DAY AS NEEDED FOR ANXIETY 30 tablet 0   Misc Natural Products (ELDERBERRY/VITAMIN C/ZINC PO) Take 1 tablet by mouth daily.     montelukast (SINGULAIR) 10 MG tablet Take 1 tablet (10 mg total) by mouth at bedtime. 90 tablet 3   omega-3 acid ethyl esters (LOVAZA) 1 g capsule Take by mouth 2 (two) times daily.     pantoprazole (PROTONIX) 40 MG tablet Take 40 mg by mouth daily.     UBRELVY 100 MG TABS Take by mouth.     albuterol (VENTOLIN HFA) 108 (90 Base) MCG/ACT inhaler INHALE 2 PUFFS INTO THE LUNGS EVERY 4 (FOUR) HOURS AS NEEDED FOR WHEEZING. 8 g 1   BIOTIN PO Take by mouth.     diclofenac sodium (VOLTAREN) 1 % GEL Apply 4 g topically 4 (four) times daily. To affected joint. 100 g 11   EMGALITY 120 MG/ML SOAJ Inject 1 mL into the skin every 30 (thirty) days.     meclizine (ANTIVERT) 25 MG tablet Take 1 tablet (25 mg total) by mouth 3 (three) times daily as needed for dizziness. 30 tablet 2   ondansetron (ZOFRAN-ODT) 4 MG disintegrating tablet Take 1 tablet (4 mg total) by mouth every 8 (eight) hours as needed for nausea or vomiting. 30 tablet 0   Probiotic Product (PROBIOTIC PO) Take 1 tablet by mouth daily.     Spacer/Aero-Holding Chambers DEVI 2 puffs by Does not apply route in the morning and at bedtime. 1 each 11   triamcinolone cream (KENALOG)  0.1 % Apply 1 Application topically 2 (two) times daily. 30 g 0   Current Facility-Administered Medications  Medication Dose Route Frequency Provider Last Rate Last Admin   0.9 %  sodium chloride infusion  500 mL Intravenous Continuous Morley Gaumer V, DO        Allergies as of 03/17/2023 - Review Complete 03/17/2023  Allergen Reaction Noted   Catfish [fish allergy] Shortness Of Breath 09/07/2014   Fish-derived products Shortness Of Breath 11/14/2012   Banana Hives 05/18/2020   Citric acid Hives 11/14/2012   Monosodium glutamate Other (See Comments) 06/01/2019   Pineapple  Hives 11/14/2012   Promethazine hcl Other (See Comments) 11/19/2017   Stevia glycerite extract [flavoring agent] Other (See Comments) 11/14/2012   Tomato Other (See Comments) 05/18/2020   Strawberry (diagnostic) Other (See Comments) 12/18/2021   Amoxicillin Other (See Comments) 09/01/2013    Family History  Problem Relation Age of Onset   Hypertension Mother    Cancer Mother        multiple myeloma   Diabetes Mother    Obesity Mother    Asthma Mother    Hypertension Father    Stroke Father    Cancer Maternal Grandmother        CLL   Hypertension Maternal Grandmother    Post-traumatic stress disorder Maternal Uncle    Crohn's disease Cousin        maternal grandfather sister daughter   Colon cancer Neg Hx    Esophageal cancer Neg Hx     Social History   Socioeconomic History   Marital status: Married    Spouse name: Not on file   Number of children: 0   Years of education: Not on file   Highest education level: Master's degree (e.g., MA, MS, MEng, MEd, MSW, MBA)  Occupational History   Not on file  Tobacco Use   Smoking status: Never    Passive exposure: Past   Smokeless tobacco: Never  Vaping Use   Vaping status: Never Used  Substance and Sexual Activity   Alcohol use: No   Drug use: No   Sexual activity: Yes    Partners: Male    Birth control/protection: Pill  Other Topics Concern    Not on file  Social History Narrative   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      Home Situation: lives with husband      Spiritual Beliefs: Christian      Lifestyle: doing 20 minutes of exercise and a little weight lifting daily; diet is healthy            Social Determinants of Health   Financial Resource Strain: Low Risk  (08/19/2022)   Overall Financial Resource Strain (CARDIA)    Difficulty of Paying Living Expenses: Not hard at all  Food Insecurity: No Food Insecurity (08/19/2022)   Hunger Vital Sign    Worried About Running Out of Food in the Last Year: Never true    Ran Out of Food in the Last Year: Never true  Transportation Needs: No Transportation Needs (08/19/2022)   PRAPARE - Administrator, Civil Service (Medical): No    Lack of Transportation (Non-Medical): No  Physical Activity: Unknown (08/19/2022)   Exercise Vital Sign    Days of Exercise per Week: 0 days    Minutes of Exercise per Session: Not on file  Stress: Stress Concern Present (08/19/2022)   [employer] of Occupational Health - Occupational Stress Questionnaire    Feeling of Stress : To some extent  Social Connections: Moderately Integrated (08/19/2022)   Social Connection and Isolation Panel [NHANES]    Frequency of Communication with Friends and Family: Never    Frequency of Social Gatherings with Friends and Family: Once a week    Attends Religious Services: More than 4 times per year    Active Member of [employer] or Organizations: Yes    Attends Banker Meetings: More than 4 times per year    Marital Status: Married  Catering manager Violence: Not At Risk (07/18/2022)   Received from [employer]  HITS    Over the last 12 months how often did your partner physically hurt you?: 1    Over the last 12 months how often did your partner insult you or talk down to you?: 2    Over the last 12 months how often did your partner threaten you with physical  harm?: 1    Over the last 12 months how often did your partner scream or curse at you?: 1    Physical Exam: Vital signs in last 24 hours: @BP  110/73   Pulse 83   Temp (!) 97.4 F (36.3 C)   Ht 5\' 5"  (1.651 m)   Wt 166 lb (75.3 kg)   SpO2 99%   BMI 27.62 kg/m  GEN: NAD EYE: Sclerae anicteric ENT: MMM CV: Non-tachycardic Pulm: CTA b/l GI: Soft, NT/ND NEURO:  Alert & Oriented x 3   Doristine Locks, DO [facility]   03/17/2023 9:21 AM

## 2023-03-17 NOTE — Op Note (Signed)
[facility] Patient Name: Kelsey [name] Procedure Date: 03/17/2023 9:24 AM MRN: [id number] Endoscopist: Doristine Locks , MD, [id number] Age: 42 Referring MD:  Date of Birth: [birth year] Gender: Female Account #: [id number] Procedure:                Upper GI endoscopy Indications:              Suspected esophageal reflux, Abdominal bloating Medicines:                Monitored Anesthesia Care Procedure:                Pre-Anesthesia Assessment:                           - Prior to the procedure, a History and Physical                            was performed, and patient medications and                            allergies were reviewed. The patient's tolerance of                            previous anesthesia was also reviewed. The risks                            and benefits of the procedure and the sedation                            options and risks were discussed with the patient.                            All questions were answered, and informed consent                            was obtained. Prior Anticoagulants: The patient has                            taken no anticoagulant or antiplatelet agents. ASA                            Grade Assessment: III - A patient with severe                            systemic disease. After reviewing the risks and                            benefits, the patient was deemed in satisfactory                            condition to undergo the procedure.                           After obtaining informed consent, the endoscope was  passed under direct vision. Throughout the                            procedure, the patient's blood pressure, pulse, and                            oxygen saturations were monitored continuously. The                            Olympus scope [id number] was introduced through the                            mouth, and advanced to the third part of duodenum.                             The upper GI endoscopy was accomplished without                            difficulty. The patient tolerated the procedure                            well. Scope In: Scope Out: Findings:                 The examined esophagus was normal.                           The Z-line was regular and was found 40 cm from the                            incisors. No hiatal hernia was noted on anterograde                            or retroflexed views.                           A single 3 mm sessile polyp with no bleeding was                            found in the gastric fundus. The polyp was removed                            with a cold biopsy forceps. Resection and retrieval                            were complete. Estimated blood loss was minimal.                           Localized mild inflammation characterized by                            erythema was found in the gastric fundus. Biopsies  were taken with a cold forceps for histology.                            Estimated blood loss was minimal.                           The gastric body, incisura, gastric antrum and                            pylorus were normal.                           The examined duodenum was normal. Biopsies were                            taken with a cold forceps for histology. Estimated                            blood loss was minimal. Complications:            No immediate complications. Estimated Blood Loss:     Estimated blood loss was minimal. Impression:               - Normal esophagus.                           - Z-line regular, 40 cm from the incisors.                           - A single gastric polyp. Resected and retrieved.                           - Mild, non-ulcer gastritis limited to a small area                            in the gastric fundus. Biopsied.                           - Normal gastric body, incisura, antrum and pylorus.                           - Normal  examined duodenum. Biopsied. Recommendation:           - Patient has a contact number available for                            emergencies. The signs and symptoms of potential                            delayed complications were discussed with the                            patient. Return to normal activities tomorrow.                            Written discharge instructions were provided to  the                            patient.                           - Resume previous diet.                           - Continue present medications.                           - Await pathology results.                           - Return to GI clinic PRN. Doristine Locks, MD 03/17/2023 9:50:01 AM

## 2023-03-17 NOTE — Progress Notes (Signed)
Report given to PACU, vss 

## 2023-03-18 ENCOUNTER — Telehealth: Payer: Self-pay

## 2023-03-18 NOTE — Telephone Encounter (Signed)
  Follow up Call-     03/17/2023    9:06 AM  Call back number  Post procedure Call Back phone  # [phone]  Permission to leave phone message Yes     Patient questions:  Do you have a fever, pain , or abdominal swelling? No. Pain Score  0 *  Have you tolerated food without any problems? Yes.    Have you been able to return to your normal activities? Yes.    Do you have any questions about your discharge instructions: Diet   No. Medications  No. Follow up visit  No.  Do you have questions or concerns about your Care? No.  Actions: * If pain score is 4 or above: No action needed, pain <4.

## 2023-03-20 LAB — SURGICAL PATHOLOGY

## 2023-04-03 ENCOUNTER — Ambulatory Visit (INDEPENDENT_AMBULATORY_CARE_PROVIDER_SITE_OTHER): Payer: 59 | Admitting: Family Medicine

## 2023-04-03 ENCOUNTER — Ambulatory Visit: Payer: 59 | Admitting: Medical-Surgical

## 2023-04-03 VITALS — BP 129/79 | HR 67 | Ht 65.0 in | Wt 168.0 lb

## 2023-04-03 DIAGNOSIS — R053 Chronic cough: Secondary | ICD-10-CM

## 2023-04-03 MED ORDER — GUAIFENESIN-CODEINE 100-10 MG/5ML PO SOLN
5.0000 mL | Freq: Three times a day (TID) | ORAL | 0 refills | Status: DC | PRN
Start: 2023-04-03 — End: 2023-04-25

## 2023-04-03 MED ORDER — AZITHROMYCIN 250 MG PO TABS: ORAL_TABLET | ORAL | 0 refills | Status: AC

## 2023-04-03 MED ORDER — BENZONATATE 100 MG PO CAPS: 100.0000 mg | ORAL_CAPSULE | Freq: Two times a day (BID) | ORAL | 0 refills | Status: DC | PRN

## 2023-04-03 NOTE — Progress Notes (Signed)
Acute Office Visit  Subjective:     Patient ID: Kelsey [name], female    DOB: 03-Jan-1981, 42 y.o.   MRN: [id number]  Chief Complaint  Patient presents with   Cough    X1 month no other symptoms, pt states whole family has the same cough they think something may be growing in the crawl space    HPI Patient is in today for cough for one month. Admits to productive sputum.   Review of Systems  Constitutional:  Negative for chills and fever.  Respiratory:  Positive for cough. Negative for shortness of breath.   Cardiovascular:  Negative for chest pain.  Neurological:  Negative for headaches.        Objective:    BP 129/79 (BP Location: Left Arm, Patient Position: Sitting, Cuff Size: Large)   Pulse 67   Ht 5\' 5"  (1.651 m)   Wt 168 lb (76.2 kg)   SpO2 100%   BMI 27.96 kg/m    Physical Exam Vitals and nursing note reviewed.  Constitutional:      General: She is not in acute distress.    Appearance: Normal appearance.  HENT:     Head: Normocephalic and atraumatic.     Right Ear: Tympanic membrane, ear canal and external ear normal.     Left Ear: Tympanic membrane, ear canal and external ear normal.     Nose: Nose normal. No congestion.     Mouth/Throat:     Pharynx: No oropharyngeal exudate or posterior oropharyngeal erythema.  Eyes:     Conjunctiva/sclera: Conjunctivae normal.  Cardiovascular:     Rate and Rhythm: Normal rate and regular rhythm.  Pulmonary:     Effort: Pulmonary effort is normal.     Breath sounds: Normal breath sounds.  Neurological:     General: No focal deficit present.     Mental Status: She is alert and oriented to person, place, and time.  Psychiatric:        Mood and Affect: Mood normal.        Behavior: Behavior normal.        Thought Content: Thought content normal.        Judgment: Judgment normal.     No results found for any visits on 04/03/23.      Assessment & Plan:   Problem List Items Addressed This Visit        Other   Chronic cough - Primary    - pt presents with one month of chronic cough. Will go ahead and treat with azithromycin as to treat for pertussis that is going around the community. If no better will need to see pulmonology - have also given tesslon perles and cough medicine to help with cough suppression      Relevant Medications   azithromycin (ZITHROMAX) 250 MG tablet    Meds ordered this encounter  Medications   azithromycin (ZITHROMAX) 250 MG tablet    Sig: Take 2 tablets on day 1, then 1 tablet daily on days 2 through 5    Dispense:  6 tablet    Refill:  0   guaiFENesin-codeine 100-10 MG/5ML syrup    Sig: Take 5 mLs by mouth 3 (three) times daily as needed for cough.    Dispense:  240 mL    Refill:  0   benzonatate (TESSALON) 100 MG capsule    Sig: Take 1 capsule (100 mg total) by mouth 2 (two) times daily as needed for cough.    Dispense:  20 capsule    Refill:  0    Return if symptoms worsen or fail to improve with PCP.  Charlton Amor, DO

## 2023-04-03 NOTE — Assessment & Plan Note (Addendum)
-   pt presents with one month of chronic cough. Will go ahead and treat with azithromycin as to treat for pertussis that is going around the community. If no better will need to see pulmonology - have also given tesslon perles and cough medicine to help with cough suppression

## 2023-04-05 ENCOUNTER — Other Ambulatory Visit: Payer: Self-pay | Admitting: Medical-Surgical

## 2023-04-05 DIAGNOSIS — Z30019 Encounter for initial prescription of contraceptives, unspecified: Secondary | ICD-10-CM

## 2023-04-07 ENCOUNTER — Encounter: Payer: 59 | Admitting: Medical-Surgical

## 2023-04-09 ENCOUNTER — Encounter (INDEPENDENT_AMBULATORY_CARE_PROVIDER_SITE_OTHER): Payer: 59 | Admitting: Family Medicine

## 2023-04-09 DIAGNOSIS — T3695XA Adverse effect of unspecified systemic antibiotic, initial encounter: Secondary | ICD-10-CM | POA: Diagnosis not present

## 2023-04-09 DIAGNOSIS — B379 Candidiasis, unspecified: Secondary | ICD-10-CM

## 2023-04-09 MED ORDER — FLUCONAZOLE 150 MG PO TABS: ORAL_TABLET | ORAL | 0 refills | Status: DC

## 2023-04-09 NOTE — Telephone Encounter (Signed)
Please see the MyChart message reply(ies) for my assessment and plan.    This patient gave consent for this Medical Advice Message and is aware that it may result in a bill to their insurance company, as well as the possibility of receiving a bill for a co-payment or deductible. They are an established patient, but are not seeking medical advice exclusively about a problem treated during an in person or video visit in the last seven days. I did not recommend an in person or video visit within seven days of my reply.    I spent a total of 7 minutes cumulative time within 7 days through MyChart messaging.  Neoma Uhrich S Kenzel Ruesch, DO   

## 2023-04-10 ENCOUNTER — Other Ambulatory Visit (HOSPITAL_COMMUNITY): Payer: Self-pay | Admitting: Psychiatry

## 2023-04-14 ENCOUNTER — Encounter: Payer: Self-pay | Admitting: Medical-Surgical

## 2023-04-14 ENCOUNTER — Other Ambulatory Visit: Payer: Self-pay | Admitting: Medical-Surgical

## 2023-04-14 DIAGNOSIS — Z30019 Encounter for initial prescription of contraceptives, unspecified: Secondary | ICD-10-CM

## 2023-04-14 MED ORDER — JUNEL 1/20 1-20 MG-MCG PO TABS: ORAL_TABLET | ORAL | 3 refills | Status: DC

## 2023-04-25 ENCOUNTER — Telehealth (INDEPENDENT_AMBULATORY_CARE_PROVIDER_SITE_OTHER): Payer: 59 | Admitting: Medical-Surgical

## 2023-04-25 DIAGNOSIS — J069 Acute upper respiratory infection, unspecified: Secondary | ICD-10-CM | POA: Diagnosis not present

## 2023-04-25 DIAGNOSIS — R059 Cough, unspecified: Secondary | ICD-10-CM

## 2023-04-25 DIAGNOSIS — J029 Acute pharyngitis, unspecified: Secondary | ICD-10-CM

## 2023-04-25 DIAGNOSIS — R430 Anosmia: Secondary | ICD-10-CM

## 2023-04-25 DIAGNOSIS — R432 Parageusia: Secondary | ICD-10-CM

## 2023-04-25 LAB — POCT INFLUENZA A/B
Influenza A, POC: NEGATIVE
Influenza B, POC: NEGATIVE

## 2023-04-25 LAB — POC COVID19 BINAXNOW: SARS Coronavirus 2 Ag: NEGATIVE

## 2023-04-25 LAB — POCT RAPID STREP A (OFFICE): Rapid Strep A Screen: NEGATIVE

## 2023-04-25 NOTE — Progress Notes (Unsigned)
Virtual Visit via Video Note  I connected with Kelsey [name] on 04/27/23 at  4:00 PM EST by a video enabled telemedicine application and verified that I am speaking with the correct person using two identifiers.   I discussed the limitations of evaluation and management by telemedicine and the availability of in person appointments. The patient expressed understanding and agreed to proceed.  Patient location: home Provider locations: office  Subjective:    CC: URI s/s   HPI: Pleasant 42 year old female presenting via MyChart video visit with reports of sinus congestion, anosmia, dysgeusia, sore throat, headache, myalgias, and a cough productive of green mucus. S/s started 2 days ago. Tested negative for COVID on Thursday but worried since her s/s are just like when she had COVID before.    Past medical history, Surgical history, Family history not pertinant except as noted below, Social history, Allergies, and medications have been entered into the medical record, reviewed, and corrections made.   Review of Systems: See HPI for pertinent positives and negatives.   Objective:    General: Speaking clearly in complete sentences without any shortness of breath.  Alert and oriented x3.  Normal judgment. No apparent acute distress.  Impression and Recommendations:    1. Viral URI with cough 2. Sore throat Drive up POCT flu/COVID/strep all negative. Likely viral etiology given on 2-3 days of s/s. Recommend conservative measure with humidifier, rest, pushing fluids, and OTC cold/flu preparations. S/s mild to moderate so no need for prescription meds right now.  - POCT Influenza A/B - POCT rapid strep A - POC COVID-19  I discussed the assessment and treatment plan with the patient. The patient was provided an opportunity to ask questions and all were answered. The patient agreed with the plan and demonstrated an understanding of the instructions.   The patient was advised to call back or  seek an in-person evaluation if the symptoms worsen or if the condition fails to improve as anticipated.  Return if symptoms worsen or fail to improve.  Thayer Ohm, DNP, APRN, FNP-BC [facility] and [facility]

## 2023-04-27 ENCOUNTER — Encounter: Payer: Self-pay | Admitting: Medical-Surgical

## 2023-05-11 ENCOUNTER — Other Ambulatory Visit: Payer: Self-pay | Admitting: Medical-Surgical

## 2023-05-29 ENCOUNTER — Encounter: Payer: Self-pay | Admitting: Internal Medicine

## 2023-05-29 ENCOUNTER — Ambulatory Visit (INDEPENDENT_AMBULATORY_CARE_PROVIDER_SITE_OTHER): Payer: 59 | Admitting: Internal Medicine

## 2023-05-29 VITALS — BP 115/80 | HR 91 | Ht 65.0 in | Wt 168.2 lb

## 2023-05-29 DIAGNOSIS — J4530 Mild persistent asthma, uncomplicated: Secondary | ICD-10-CM

## 2023-05-29 DIAGNOSIS — J301 Allergic rhinitis due to pollen: Secondary | ICD-10-CM

## 2023-05-29 MED ORDER — BUDESONIDE-FORMOTEROL FUMARATE 160-4.5 MCG/ACT IN AERO: 2.0000 | INHALATION_SPRAY | Freq: Every day | RESPIRATORY_TRACT | 5 refills | Status: DC

## 2023-05-29 NOTE — Progress Notes (Signed)
 Kelsey [name]    [id number]    April 07, 1981  Primary Care Physician:Jessup, Zada, NP Date of Appointment: 05/29/2023 Established Patient Visit  Chief complaint:   Chief Complaint  Patient presents with   Follow-up    Pt states she has been doing good. Started on Symbicort  lov and states it has been managing. Denies any concerns      HPI: Kelsey [name] is a 43 y.o. woman with moderate persistent asthma  Interval Updates: Doing well on symbicort . Dropped down to 2 puffs once daily due to difficulty sleeping with pm dosing. No thrush. Symptoms controlled on once daily symbicort .   Current Regimen: symbicort  160 2 puffs once daily, albuterol  prn Asthma Triggers: rainy weather, exertion, URIs, anxiety Exacerbations in the last year: 4-5 requiring prednisone  History of hospitalization or intubation: never been hospitalized but had ED visits as an adult last in 2007.  Allergy Testing: had SPT after 2007. Allergic to grasses, horses, cats.  GERD: yes, on protonix  daily.  Allergic Rhinitis: on singulair  and flonase  for rhinitis with cetirizine.  ACT:  Asthma Control Test ACT Total Score  05/29/2023 10:02 AM 23  08/14/2022 10:42 AM 19   FeNO: 39 ppb Serum Eos/IgE: eos 87 in 01/2021,   I have reviewed the patient's family social and past medical history and updated as appropriate.   Past Medical History:  Diagnosis Date   Allergy    Anxiety and depression    Asthma    Brain injury (HCC) 2014   Clotting disorder (HCC) 02/17/2022   Depression    Endometriosis    Frequent headaches    GERD (gastroesophageal reflux disease)    Hx of varicella    IBS (irritable bowel syndrome)    Migraines    Panic attacks    Postpartum care following cesarean delivery (2/18) 07/08/2015   Seizure (HCC)    Vertigo 12/31/2013    Past Surgical History:  Procedure Laterality Date   CESAREAN SECTION N/A 07/08/2015   Procedure: CESAREAN SECTION;  Surgeon: Burnard Bowers, MD;   Location: [facility];  Service: Obstetrics;  Laterality: N/A;   ESOPHAGOGASTRODUODENOSCOPY     [facility] 2009-2010   NO PAST SURGERIES      Family History  Problem Relation Age of Onset   Hypertension Mother    Cancer Mother        multiple myeloma   Diabetes Mother    Obesity Mother    Asthma Mother    Hypertension Father    Stroke Father    Cancer Maternal Grandmother        CLL   Hypertension Maternal Grandmother    Post-traumatic stress disorder Maternal Uncle    Crohn's disease Cousin        maternal grandfather sister daughter   Colon cancer Neg Hx    Esophageal cancer Neg Hx     Social History   Occupational History   Not on file  Tobacco Use   Smoking status: Never    Passive exposure: Past   Smokeless tobacco: Never  Vaping Use   Vaping status: Never Used  Substance and Sexual Activity   Alcohol use: No   Drug use: No   Sexual activity: Yes    Partners: Male    Birth control/protection: Pill     Physical Exam: Blood pressure 115/80, pulse 91, height 5' 5 (1.651 m), weight 168 lb 3.2 oz (76.3 kg), SpO2 99%.  Gen:  No acute distress ENT: no thrush Lungs:   ctab no wheeze CV:        RRR no mrg   Data Reviewed: Imaging: I have personally reviewed the   PFTs:     Latest Ref Rng & Units 01/22/2023    3:35 PM  PFT Results  FVC-Pre L 3.93   FVC-Predicted Pre % 102   FVC-Post L 4.12   FVC-Predicted Post % 107   Pre FEV1/FVC % % 61   Post FEV1/FCV % % 72   FEV1-Pre L 2.40   FEV1-Predicted Pre % 77   FEV1-Post L 2.95   DLCO uncorrected ml/min/mmHg 21.06   DLCO UNC% % 93   DLCO corrected ml/min/mmHg 21.06   DLCO COR %Predicted % 93   DLVA Predicted % 93   TLC L 5.88   TLC % Predicted % 112   RV % Predicted % 132    I have personally reviewed the patient's PFTs and mild airflow limtiation with +BD response, hyperinflation and air trapping.   Labs: Lab Results  Component Value Date   NA 137 02/19/2023   K 4.0 02/19/2023    CO2 21 02/19/2023   GLUCOSE 64 (L) 02/19/2023   BUN 11 02/19/2023   CREATININE 1.04 (H) 02/19/2023   CALCIUM 9.4 02/19/2023   GFR 89.17 03/22/2014   EGFR 70 02/19/2023   GFRNONAA 71 02/11/2020   Lab Results  Component Value Date   WBC 4.7 02/19/2023   HGB 14.1 02/19/2023   HCT 43.2 02/19/2023   MCV 88 02/19/2023   PLT 270 02/19/2023     Immunization status: Immunization History  Administered Date(s) Administered   Hepatitis A 04/23/2006, 04/02/2007   Hepatitis B, ADULT 10/26/2010, 08/20/2011   IPV 04/02/2007   Influenza,inj,Quad PF,6+ Mos 03/08/2014, 01/14/2019, 02/08/2020, 02/09/2021, 05/09/2022   Influenza-Unspecified 02/17/2017, 01/22/2023   MMR 03/03/1984, 07/20/1996   Meningococcal polysaccharide vaccine (MPSV4) 11/17/2000   Moderna Sars-Covid-2 Vaccination 09/10/2019, 10/08/2019   OPV 01/16/1988   PPD Test 11/19/2000   Pfizer(Comirnaty)Fall Seasonal Vaccine 12 years and older 05/14/2022   Td 08/22/2003   Tdap 03/08/2014, 09/25/2019   Typhoid Parenteral 04/23/2006    External Records Personally Reviewed: urgent care  Assessment:  Moderate persistent asthma improved control Allergic rhinitis not well controlled  Plan/Recommendations:  Glad your breathing is doing well.   Continue the symbicort  2 puffs once daily. Gargle after use.  If you are feeling sick, increase to twice daily.   Continue albuterol  as needed.   Continue allegra  for allergies - can try xyzal or zyrtec if not working.  Continue flonase . Can add astelin nasal spray.    Return to Care: Return in about 6 months (around 11/26/2023) for virtual visit.   Verdon Gore, MD Pulmonary and Critical Care Medicine [facility] Office:[phone]

## 2023-05-29 NOTE — Patient Instructions (Addendum)
 It was a pleasure to see you today!  Please schedule follow up scheduled with myself in 6 months - virtual visit.  If my schedule is not open yet, we will contact you with a reminder closer to that time. Please call [phone] if you haven't heard from us  a month before, and always call us  sooner if issues or concerns arise. You can also send us  a message through MyChart, but but aware that this is not to be used for urgent issues and it may take up to 5-7 days to receive a reply. Please be aware that you will likely be able to view your results before I have a chance to respond to them. Please give us  5 business days to respond to any non-urgent results.    Glad your breathing is doing well.   Continue the symbicort  2 puffs once daily. Gargle after use.  If you are feeling sick, increase to twice daily.   Continue albuterol  as needed.   Continue allegra  for allergies - can try xyzal or zyrtec if not working.  Continue flonase . Can add astelin nasal spray.

## 2023-06-24 ENCOUNTER — Encounter: Payer: 59 | Attending: Psychology | Admitting: Psychology

## 2023-06-24 DIAGNOSIS — G3184 Mild cognitive impairment, so stated: Secondary | ICD-10-CM | POA: Diagnosis present

## 2023-06-24 DIAGNOSIS — F0781 Postconcussional syndrome: Secondary | ICD-10-CM | POA: Insufficient documentation

## 2023-06-24 DIAGNOSIS — R519 Headache, unspecified: Secondary | ICD-10-CM

## 2023-06-24 DIAGNOSIS — G43009 Migraine without aura, not intractable, without status migrainosus: Secondary | ICD-10-CM | POA: Diagnosis present

## 2023-06-28 ENCOUNTER — Encounter: Payer: Self-pay | Admitting: Medical-Surgical

## 2023-07-03 ENCOUNTER — Ambulatory Visit (INDEPENDENT_AMBULATORY_CARE_PROVIDER_SITE_OTHER): Payer: 59 | Admitting: Family Medicine

## 2023-07-03 ENCOUNTER — Encounter: Payer: Self-pay | Admitting: Family Medicine

## 2023-07-03 VITALS — BP 120/75 | HR 76 | Temp 98.0°F | Ht 65.0 in | Wt 167.7 lb

## 2023-07-03 DIAGNOSIS — H6503 Acute serous otitis media, bilateral: Secondary | ICD-10-CM

## 2023-07-03 DIAGNOSIS — H6590 Unspecified nonsuppurative otitis media, unspecified ear: Secondary | ICD-10-CM | POA: Insufficient documentation

## 2023-07-03 MED ORDER — PREDNISONE 20 MG PO TABS: 20.0000 mg | ORAL_TABLET | Freq: Every day | ORAL | 0 refills | Status: AC

## 2023-07-03 NOTE — Assessment & Plan Note (Signed)
Adding prednisone burst.  Recommend use of Flonase regularly.  We discussed using decongestant but she states this gives her palpitations so we will avoid this.  We discussed signs and symptoms of infection she will let me know if having worsening symptoms.

## 2023-07-03 NOTE — Patient Instructions (Signed)
Eustachian Tube Dysfunction  Eustachian tube dysfunction refers to a condition in which a blockage develops in the narrow passage that connects the middle ear to the back of the nose (eustachian tube). The eustachian tube regulates air pressure in the middle ear by letting air move between the ear and nose. It also helps to drain fluid from the middle ear space. Eustachian tube dysfunction can affect one or both ears. When the eustachian tube does not function properly, air pressure, fluid, or both can build up in the middle ear. What are the causes? This condition occurs when the eustachian tube becomes blocked or cannot open normally. Common causes of this condition include: Ear infections. Colds and other infections that affect the nose, mouth, and throat (upper respiratory tract). Allergies. Irritation from cigarette smoke. Irritation from stomach acid coming up into the esophagus (gastroesophageal reflux). The esophagus is the part of the body that moves food from the mouth to the stomach. Sudden changes in air pressure, such as from descending in an airplane or scuba diving. Abnormal growths in the nose or throat, such as: Growths that line the nose (nasal polyps). Abnormal growth of cells (tumors). Enlarged tissue at the back of the throat (adenoids). What increases the risk? You are more likely to develop this condition if: You smoke. You are overweight. You are a child who has: Certain birth defects of the mouth, such as cleft palate. Large tonsils or adenoids. What are the signs or symptoms? Common symptoms of this condition include: A feeling of fullness in the ear. Ear pain. Clicking or popping noises in the ear. Ringing in the ear (tinnitus). Hearing loss. Loss of balance. Dizziness. Symptoms may get worse when the air pressure around you changes, such as when you travel to an area of high elevation, fly on an airplane, or go scuba diving. How is this diagnosed? This  condition may be diagnosed based on: Your symptoms. A physical exam of your ears, nose, and throat. Tests, such as those that measure: The movement of your eardrum. Your hearing (audiometry). How is this treated? Treatment depends on the cause and severity of your condition. In mild cases, you may relieve your symptoms by moving air into your ears. This is called "popping the ears." In more severe cases, or if you have symptoms of fluid in your ears, treatment may include: Medicines to relieve congestion (decongestants). Medicines that treat allergies (antihistamines). Nasal sprays or ear drops that contain medicines that reduce swelling (steroids). A procedure to drain the fluid in your eardrum. In this procedure, a small tube may be placed in the eardrum to: Drain the fluid. Restore the air in the middle ear space. A procedure to insert a balloon device through the nose to inflate the opening of the eustachian tube (balloon dilation). Follow these instructions at home: Lifestyle Do not do any of the following until your health care provider approves: Travel to high altitudes. Fly in airplanes. Work in a Estate agent or room. Scuba dive. Do not use any products that contain nicotine or tobacco. These products include cigarettes, chewing tobacco, and vaping devices, such as e-cigarettes. If you need help quitting, ask your health care provider. Keep your ears dry. Wear fitted earplugs during showering and bathing. Dry your ears completely after. General instructions Take over-the-counter and prescription medicines only as told by your health care provider. Use techniques to help pop your ears as recommended by your health care provider. These may include: Chewing gum. Yawning. Frequent, forceful swallowing.  Closing your mouth, holding your nose closed, and gently blowing as if you are trying to blow air out of your nose. Keep all follow-up visits. This is important. Contact a  health care provider if: Your symptoms do not go away after treatment. Your symptoms come back after treatment. You are unable to pop your ears. You have: A fever. Pain in your ear. Pain in your head or neck. Fluid draining from your ear. Your hearing suddenly changes. You become very dizzy. You lose your balance. Get help right away if: You have a sudden, severe increase in any of your symptoms. Summary Eustachian tube dysfunction refers to a condition in which a blockage develops in the eustachian tube. It can be caused by ear infections, allergies, inhaled irritants, or abnormal growths in the nose or throat. Symptoms may include ear pain or fullness, hearing loss, or ringing in the ears. Mild cases are treated with techniques to unblock the ears, such as yawning or chewing gum. More severe cases are treated with medicines or procedures. This information is not intended to replace advice given to you by your health care provider. Make sure you discuss any questions you have with your health care provider. Document Revised: 07/17/2020 Document Reviewed: 07/17/2020 Elsevier Patient Education  2024 [employer].

## 2023-07-03 NOTE — Progress Notes (Signed)
Kelsey [name] - 43 y.o. female MRN [id number]  Date of birth: 1980-12-10  Subjective Chief Complaint  Patient presents with   Ear Fullness    HPI Kelsey [name] is a 43 y.o. female here today for with complaint of b/l ear fullness. This started a few days ago.  Worse in the R with some mild pain.  She was seen via virtual visit yesterday and prescribed polytrim drops.  She denies drainage, fever, or chills or headache.   ROS:  A comprehensive ROS was completed and negative except as noted per HPI  Allergies  Allergen Reactions   Catfish [Fish Allergy] Shortness Of Breath   Fish-Derived Products Shortness Of Breath   Banana Hives   Citric Acid Hives   Monosodium Glutamate Other (See Comments)    Headache    Pineapple Hives   Promethazine Hcl Other (See Comments)    Restless motion   Stevia Glycerite Extract [Flavoring Agent (Non-Screening)] Other (See Comments)    Mouth sores Headache   Tomato Other (See Comments)   Strawberry (Diagnostic) Other (See Comments)   Amoxicillin Other (See Comments)    Stomach pain    Past Medical History:  Diagnosis Date   Allergy    Anxiety and depression    Asthma    Brain injury (HCC) 2014   Clotting disorder (HCC) 02/17/2022   Depression    Endometriosis    Frequent headaches    GERD (gastroesophageal reflux disease)    Hx of varicella    IBS (irritable bowel syndrome)    Migraines    Panic attacks    Postpartum care following cesarean delivery (2/18) 07/08/2015   Seizure (HCC)    Vertigo 12/31/2013    Past Surgical History:  Procedure Laterality Date   CESAREAN SECTION N/A 07/08/2015   Procedure: CESAREAN SECTION;  Surgeon: Noland Fordyce, MD;  Location: [facility];  Service: Obstetrics;  Laterality: N/A;   ESOPHAGOGASTRODUODENOSCOPY     [facility] 2009-2010   NO PAST SURGERIES      Social History   Socioeconomic History   Marital status: Married    Spouse name: Not on file   Number of children: 0   Years of  education: Not on file   Highest education level: Master's degree (e.g., MA, MS, MEng, MEd, MSW, MBA)  Occupational History   Not on file  Tobacco Use   Smoking status: Never    Passive exposure: Past   Smokeless tobacco: Never  Vaping Use   Vaping status: Never Used  Substance and Sexual Activity   Alcohol use: No   Drug use: No   Sexual activity: Yes    Partners: Male    Birth control/protection: Pill  Other Topics Concern   Not on file  Social History Narrative   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      Home Situation: lives with husband      Spiritual Beliefs: Christian      Lifestyle: doing 20 minutes of exercise and a little weight lifting daily; diet is healthy            Social Drivers of Corporate investment banker Strain: Low Risk  (04/03/2023)   Overall Financial Resource Strain (CARDIA)    Difficulty of Paying Living Expenses: Not hard at all  Food Insecurity: No Food Insecurity (04/03/2023)   Hunger Vital Sign    Worried About Running Out of Food in the Last Year: Never true    Ran Out of  Food in the Last Year: Never true  Transportation Needs: No Transportation Needs (04/03/2023)   PRAPARE - Administrator, Civil Service (Medical): No    Lack of Transportation (Non-Medical): No  Physical Activity: Insufficiently Active (04/03/2023)   Exercise Vital Sign    Days of Exercise per Week: 1 day    Minutes of Exercise per Session: 10 min  Stress: No Stress Concern Present (04/03/2023)   [employer] of Occupational Health - Occupational Stress Questionnaire    Feeling of Stress : Only a little  Social Connections: Moderately Integrated (04/03/2023)   Social Connection and Isolation Panel [NHANES]    Frequency of Communication with Friends and Family: Once a week    Frequency of Social Gatherings with Friends and Family: Never    Attends Religious Services: More than 4 times per year    Active Member of [employer] or Organizations:  Yes    Attends Engineer, structural: More than 4 times per year    Marital Status: Married    Family History  Problem Relation Age of Onset   Hypertension Mother    Cancer Mother        multiple myeloma   Diabetes Mother    Obesity Mother    Asthma Mother    Hypertension Father    Stroke Father    Cancer Maternal Grandmother        CLL   Hypertension Maternal Grandmother    Post-traumatic stress disorder Maternal Uncle    Crohn's disease Cousin        maternal grandfather sister daughter   Colon cancer Neg Hx    Esophageal cancer Neg Hx     Health Maintenance  Topic Date Due   Pneumococcal Vaccine 58-57 Years old (1 of 2 - PCV) Never done   COVID-19 Vaccine (4 - 2024-25 season) 01/19/2023   Cervical Cancer Screening (HPV/Pap Cotest)  02/09/2026   DTaP/Tdap/Td (4 - Td or Tdap) 09/24/2029   INFLUENZA VACCINE  Completed   Hepatitis C Screening  Completed   HIV Screening  Completed   HPV VACCINES  Aged Out     ----------------------------------------------------------------------------------------------------------------------------------------------------------------------------------------------------------------- Physical Exam BP 120/75 (BP Location: Left Arm, Patient Position: Sitting, Cuff Size: Large)   Pulse 76   Temp 98 F (36.7 C) (Oral)   Ht 5\' 5"  (1.651 m)   Wt 167 lb 11.2 oz (76.1 kg)   SpO2 100%   BMI 27.91 kg/m   Physical Exam Constitutional:      Appearance: Normal appearance.  HENT:     Head: Normocephalic and atraumatic.     Right Ear: Ear canal normal.     Left Ear: Ear canal normal.     Ears:     Comments: Bilateral serous effusion.  Eyes:     General: No scleral icterus. Musculoskeletal:     Cervical back: Neck supple.  Neurological:     General: No focal deficit present.     Mental Status: She is alert.  Psychiatric:        Mood and Affect: Mood normal.        Behavior: Behavior normal.      ------------------------------------------------------------------------------------------------------------------------------------------------------------------------------------------------------------------- Assessment and Plan  Serous otitis media Adding prednisone burst.  Recommend use of Flonase regularly.  We discussed using decongestant but she states this gives her palpitations so we will avoid this.  We discussed signs and symptoms of infection she will let me know if having worsening symptoms.   Meds ordered this encounter  Medications  predniSONE (DELTASONE) 20 MG tablet    Sig: Take 1 tablet (20 mg total) by mouth daily with breakfast for 5 days.    Dispense:  5 tablet    Refill:  0    No follow-ups on file.    This visit occurred during the SARS-CoV-2 public health emergency.  Safety protocols were in place, including screening questions prior to the visit, additional usage of staff PPE, and extensive cleaning of exam room while observing appropriate contact time as indicated for disinfecting solutions.

## 2023-07-04 ENCOUNTER — Other Ambulatory Visit: Payer: Self-pay | Admitting: Internal Medicine

## 2023-07-04 NOTE — Telephone Encounter (Signed)
Can you please see what are the covered alternatives?

## 2023-07-07 ENCOUNTER — Other Ambulatory Visit: Payer: Self-pay | Admitting: Medical-Surgical

## 2023-07-07 ENCOUNTER — Telehealth (HOSPITAL_COMMUNITY): Payer: Self-pay | Admitting: *Deleted

## 2023-07-07 ENCOUNTER — Other Ambulatory Visit (HOSPITAL_COMMUNITY): Payer: Self-pay | Admitting: *Deleted

## 2023-07-07 DIAGNOSIS — Z30019 Encounter for initial prescription of contraceptives, unspecified: Secondary | ICD-10-CM

## 2023-07-07 MED ORDER — AMITRIPTYLINE HCL 25 MG PO TABS: ORAL_TABLET | ORAL | 2 refills | Status: DC

## 2023-07-07 NOTE — Telephone Encounter (Signed)
 Rx Refill Request [facility] [phone] - [place], [place] - [address]   amitriptyline (ELAVIL) 25 MG tablet   Next Appt  07/28/23 Last Appt   01/27/23

## 2023-07-07 NOTE — Telephone Encounter (Signed)
 Per pharmacy the suggested alternative is the generic Advair Diksus [facility])

## 2023-07-07 NOTE — Telephone Encounter (Signed)
 Refill Sent Co Sign  amitriptyline (ELAVIL) 25 MG tablet

## 2023-07-09 ENCOUNTER — Encounter: Payer: Self-pay | Admitting: Medical-Surgical

## 2023-07-09 DIAGNOSIS — Z30019 Encounter for initial prescription of contraceptives, unspecified: Secondary | ICD-10-CM

## 2023-07-10 MED ORDER — JUNEL 1/20 1-20 MG-MCG PO TABS: ORAL_TABLET | ORAL | 3 refills | Status: DC

## 2023-07-28 ENCOUNTER — Telehealth (INDEPENDENT_AMBULATORY_CARE_PROVIDER_SITE_OTHER): Payer: 59 | Admitting: Psychiatry

## 2023-07-28 ENCOUNTER — Encounter (HOSPITAL_COMMUNITY): Payer: Self-pay | Admitting: Psychiatry

## 2023-07-28 DIAGNOSIS — F5102 Adjustment insomnia: Secondary | ICD-10-CM

## 2023-07-28 DIAGNOSIS — F419 Anxiety disorder, unspecified: Secondary | ICD-10-CM | POA: Diagnosis not present

## 2023-07-28 DIAGNOSIS — F410 Panic disorder [episodic paroxysmal anxiety] without agoraphobia: Secondary | ICD-10-CM | POA: Diagnosis not present

## 2023-07-28 MED ORDER — AMITRIPTYLINE HCL 25 MG PO TABS: ORAL_TABLET | ORAL | 2 refills | Status: DC

## 2023-07-28 MED ORDER — LORAZEPAM 0.5 MG PO TABS: ORAL_TABLET | ORAL | 0 refills | Status: DC

## 2023-07-28 NOTE — Progress Notes (Signed)
 [facility] Follow up visit  Patient Identification: Kelsey [name] MRN:  [id number] Date of Evaluation:  07/28/2023 Referral Source: GI  Chief Complaint:  follow up sleep, anxiety Visit Diagnosis:    ICD-10-CM   1. Anxiety disorder, unspecified type  F41.9     2. Panic attacks  F41.0     3. Adjustment insomnia  F51.02     Virtual Visit via Video Note  I connected with Kelsey [name] on 07/28/23 at 10:00 AM EDT by a video enabled telemedicine application and verified that I am speaking with the correct person using two identifiers.  Location: Patient: home Provider: home office   I discussed the limitations of evaluation and management by telemedicine and the availability of in person appointments. The patient expressed understanding and agreed to proceed.     I discussed the assessment and treatment plan with the patient. The patient was provided an opportunity to ask questions and all were answered. The patient agreed with the plan and demonstrated an understanding of the instructions.   The patient was advised to call back or seek an in-person evaluation if the symptoms worsen or if the condition fails to improve as anticipated.  I provided 18 minutes of non-face-to-face time during this encounter.     History of Present Illness: Patient is a 43 years old currently married African-American female referred by GI doctor for establishment of care for possible anxiety.  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 that keeps her busy Seldom has panic attacks and ativan helps    Aggravating  factor: presentations,  past head injury, relationship can be, long covid Modifying factor:  family Severity  manageable  Duration more so  last few years   Past Psychiatric History: anxiety  Consequences of Substance Abuse: NA  Past Medical History:  Past Medical History:  Diagnosis Date   Allergy    Anxiety and depression    Asthma    Brain injury (HCC) 2014   Clotting disorder (HCC) 02/17/2022   Depression    Endometriosis    Frequent headaches    GERD (gastroesophageal reflux disease)    Hx of varicella    IBS (irritable bowel syndrome)    Migraines    Panic attacks    Postpartum care following cesarean delivery (2/18) 07/08/2015   Seizure (HCC)    Vertigo 12/31/2013    Past Surgical History:  Procedure Laterality Date   CESAREAN SECTION N/A 07/08/2015   Procedure: CESAREAN SECTION;  Surgeon: Noland Fordyce, MD;  Location: [facility];  Service: Obstetrics;  Laterality: N/A;   ESOPHAGOGASTRODUODENOSCOPY     [facility] 2009-2010   NO PAST SURGERIES      Family Psychiatric History: Mom: OCD possibel  Family History:  Family History  Problem Relation Age of Onset   Hypertension Mother    Cancer Mother        multiple myeloma   Diabetes Mother    Obesity Mother    Asthma Mother    Hypertension Father    Stroke Father    Cancer Maternal Grandmother        CLL   Hypertension Maternal Grandmother    Post-traumatic stress disorder Maternal Uncle    Crohn's disease Cousin  maternal grandfather sister daughter   Colon cancer Neg Hx    Esophageal cancer Neg Hx     Social History:   Social History   Socioeconomic History   Marital status: Married    Spouse name: Not on file   Number of children: 0   Years of education: Not on file   Highest education level: Master's degree (e.g., MA, MS, MEng, MEd, MSW, MBA)  Occupational History   Not on file  Tobacco Use   Smoking status: Never    Passive exposure: Past   Smokeless tobacco: Never  Vaping Use   Vaping status: Never Used  Substance and Sexual Activity   Alcohol use: No   Drug use: No   Sexual activity: Yes    Partners:  Male    Birth control/protection: Pill  Other Topics Concern   Not on file  Social History Narrative   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      Home Situation: lives with husband      Spiritual Beliefs: Christian      Lifestyle: doing 20 minutes of exercise and a little weight lifting daily; diet is healthy            Social Drivers of Corporate investment banker Strain: Low Risk  (04/03/2023)   Overall Financial Resource Strain (CARDIA)    Difficulty of Paying Living Expenses: Not hard at all  Food Insecurity: No Food Insecurity (04/03/2023)   Hunger Vital Sign    Worried About Running Out of Food in the Last Year: Never true    Ran Out of Food in the Last Year: Never true  Transportation Needs: No Transportation Needs (04/03/2023)   PRAPARE - Administrator, Civil Service (Medical): No    Lack of Transportation (Non-Medical): No  Physical Activity: Insufficiently Active (04/03/2023)   Exercise Vital Sign    Days of Exercise per Week: 1 day    Minutes of Exercise per Session: 10 min  Stress: No Stress Concern Present (04/03/2023)   [employer] of Occupational Health - Occupational Stress Questionnaire    Feeling of Stress : Only a little  Social Connections: Moderately Integrated (04/03/2023)   Social Connection and Isolation Panel [NHANES]    Frequency of Communication with Friends and Family: Once a week    Frequency of Social Gatherings with Friends and Family: Never    Attends Religious Services: More than 4 times per year    Active Member of [employer] or Organizations: Yes    Attends Engineer, structural: More than 4 times per year    Marital Status: Married     Allergies:   Allergies  Allergen Reactions   Catfish [Fish Allergy] Shortness Of Breath   Fish-Derived Products Shortness Of Breath   Banana Hives   Citric Acid Hives   Monosodium Glutamate Other (See Comments)    Headache    Pineapple Hives   Promethazine Hcl  Other (See Comments)    Restless motion   Stevia Glycerite Extract [Flavoring Agent (Non-Screening)] Other (See Comments)    Mouth sores Headache   Tomato Other (See Comments)   Strawberry (Diagnostic) Other (See Comments)   Amoxicillin Other (See Comments)    Stomach pain    Metabolic Disorder Labs: Lab Results  Component Value Date   HGBA1C 5.2 03/08/2014   No results found for: "PROLACTIN" Lab Results  Component Value Date   CHOL 152 02/19/2023   TRIG 75 02/19/2023   HDL  43 02/19/2023   CHOLHDL 3.5 02/19/2023   VLDL 16.1 03/08/2014   LDLCALC 94 02/19/2023   LDLCALC 103 (H) 02/09/2021   Lab Results  Component Value Date   TSH 0.72 07/22/2022    Therapeutic Level Labs: No results found for: "LITHIUM" No results found for: "CBMZ" No results found for: "VALPROATE"  Current Medications: Current Outpatient Medications  Medication Sig Dispense Refill   albuterol (VENTOLIN HFA) 108 (90 Base) MCG/ACT inhaler INHALE 2 PUFFS INTO THE LUNGS EVERY 4 (FOUR) HOURS AS NEEDED FOR WHEEZING. 8 g 1   amitriptyline (ELAVIL) 25 MG tablet TAKE 1 TABLET BY MOUTH EVERYDAY AT BEDTIME 30 tablet 2   BIOTIN PO Take by mouth.     diclofenac sodium (VOLTAREN) 1 % GEL Apply 4 g topically 4 (four) times daily. To affected joint. 100 g 11   EMGALITY 120 MG/ML SOAJ Inject 1 mL into the skin every 30 (thirty) days.     fexofenadine (ALLEGRA) 180 MG tablet Take 180 mg by mouth daily.     fluticasone (FLONASE) 50 MCG/ACT nasal spray SPRAY 2 SPRAYS INTO EACH NOSTRIL EVERY DAY 16 mL 12   fluticasone-salmeterol (WIXELA INHUB) 250-50 MCG/ACT AEPB Inhale 1 puff into the lungs in the morning and at bedtime. 60 each 0   JUNEL 1/20 1-20 MG-MCG tablet TAKE 1 (ONE) TABLET DAILY CONTINUOUS ACTIVE PILLS 84 tablet 3   LORazepam (ATIVAN) 0.5 MG tablet TAKE 1 TABLET BY MOUTH EVERY DAY AS NEEDED FOR ANXIETY 30 tablet 0   meclizine (ANTIVERT) 25 MG tablet Take 1 tablet (25 mg total) by mouth 3 (three) times daily as  needed for dizziness. 30 tablet 2   Misc Natural Products (ELDERBERRY/VITAMIN C/ZINC PO) Take 1 tablet by mouth daily.     montelukast (SINGULAIR) 10 MG tablet Take 1 tablet (10 mg total) by mouth at bedtime. 90 tablet 3   omega-3 acid ethyl esters (LOVAZA) 1 g capsule Take by mouth 2 (two) times daily.     ondansetron (ZOFRAN-ODT) 4 MG disintegrating tablet Take 1 tablet (4 mg total) by mouth every 8 (eight) hours as needed for nausea or vomiting. 30 tablet 0   pantoprazole (PROTONIX) 40 MG tablet TAKE 1 TABLET BY MOUTH EVERY DAY 30 tablet 2   Spacer/Aero-Holding Chambers DEVI 2 puffs by Does not apply route in the morning and at bedtime. 1 each 11   triamcinolone cream (KENALOG) 0.1 % Apply 1 Application topically 2 (two) times daily. 30 g 0   UBRELVY 100 MG TABS Take by mouth.     No current facility-administered medications for this visit.     Psychiatric Specialty Exam: Review of Systems  Cardiovascular:  Negative for chest pain.  Neurological:  Negative for tremors.  Psychiatric/Behavioral:  Negative for self-injury.     There were no vitals taken for this visit.There is no height or weight on file to calculate BMI.  General Appearance: casual  Eye Contact:  fair  Speech:  Normal Rate  Volume:  Normal  Mood: fair  Affect:  Full Range  Thought Process:  Goal Directed  Orientation:  Full (Time, Place, and Person)  Thought Content:  Rumination  Suicidal Thoughts:  No  Homicidal Thoughts:  No  Memory:  Immediate;   Fair Recent;   Fair  Judgement:  Fair  Insight:  Fair  Psychomotor Activity:  Normal  Concentration:  Concentration: Good and Attention Span: Good  Recall:  Good  Fund of Knowledge:Good  Language: Good  Akathisia:  No  Handed:    AIMS (if indicated):  not done  Assets:  Communication Skills Desire for Improvement Financial Resources/Insurance Housing  ADL's:  Intact  Cognition: WNL  Sleep:  Fair   Screenings: GAD-7    Flowsheet Row Office Visit from  05/09/2022 in [facility] Primary [facility] Medicine at [facility] Office Visit from 08/16/2021 in [facility] Primary Care & [facility] Medicine at [facility] Office Visit from 11[facility]19/2017 in [facility] Care & [facility] at [facility]  Total GAD-7 Score 0 0 3      PHQ2-9    Flowsheet Row Office Visit from 12/31/2022 in [facility] at [facility] Office Visit from 09/30/2022 in [facility] Primary Care & Sports Medicine at [facility] Office Visit from 07/22/2022 in [facility] Primary Care & Sports Medicine at [facility] Office Visit from 05/09/2022 in [facility] Primary Care & Sports Medicine at [facility] Office Visit from 08/16/2021 in [facility] Primary Care & Sports Medicine at [facility]  PHQ-2 Total Score 0 1 0 2 0  PHQ-9 Total Score -- -- -- 4 2      Flowsheet Row ED from 01/04/2023 in [facility] at [facility] ED from 11/02/2022 in [facility] at [facility] ED from 07/08/2022 in [facility]gent Care at [facility] RISK CATEGORY No Risk No Risk No Risk       Assessment and Plan: as follows Prior documentation reviewed    Panic attacks; sporadic but manageable continue breathing techniques, ativan prn   anxiety unspecified: fair continue ativan prn Insomnia: manageable continue sleep hygiene and imipramine  Revieewd meds and sent  Fu   4 m.  Thresa Ross, MD 3/10/202510:06 AM

## 2023-07-29 ENCOUNTER — Other Ambulatory Visit: Payer: Self-pay

## 2023-07-29 ENCOUNTER — Ambulatory Visit
Admission: RE | Admit: 2023-07-29 | Discharge: 2023-07-29 | Disposition: A | Discharge status: Home / Self Care | Source: Ambulatory Visit | Attending: Emergency Medicine | Admitting: Emergency Medicine

## 2023-07-29 VITALS — BP 115/79 | HR 89 | Temp 98.9°F | Resp 16

## 2023-07-29 DIAGNOSIS — B9689 Other specified bacterial agents as the cause of diseases classified elsewhere: Secondary | ICD-10-CM | POA: Diagnosis not present

## 2023-07-29 DIAGNOSIS — J019 Acute sinusitis, unspecified: Secondary | ICD-10-CM

## 2023-07-29 MED ORDER — FLUCONAZOLE 150 MG PO TABS
150.0000 mg | ORAL_TABLET | Freq: Once | ORAL | 0 refills | Status: DC | PRN
Start: 2023-07-29 — End: 2023-10-17

## 2023-07-29 MED ORDER — AZITHROMYCIN 250 MG PO TABS: ORAL_TABLET | ORAL | 0 refills | Status: DC

## 2023-07-29 NOTE — ED Triage Notes (Signed)
 Sore throat since Thursday. Reports at first it only hurt at night but today is all day. Has pain to bil inner ears. Has tried throat spray.

## 2023-07-29 NOTE — ED Provider Notes (Signed)
 [facility]    CSN: [id number] Arrival date & time: 07/29/23  1805     History   Chief Complaint Chief Complaint  Patient presents with   Sore Throat    HPI Kelsey [name] is a 43 y.o. female.  6-day history of progressively worsening nasal congestion with thick green mucus.  Some sinus pressure. Also having sore throat which has also increased in severity.  Bilateral ear pain and fullness.  No fever, no cough. Has tried throat spray.  Uses Zyrtec and Flonase daily  Past Medical History:  Diagnosis Date   Allergy    Anxiety and depression    Asthma    Brain injury (HCC) 2014   Clotting disorder (HCC) 02/17/2022   Depression    Endometriosis    Frequent headaches    GERD (gastroesophageal reflux disease)    Hx of varicella    IBS (irritable bowel syndrome)    Migraines    Panic attacks    Postpartum care following cesarean delivery (2/18) 07/08/2015   Seizure (HCC)    Vertigo 12/31/2013    Patient Active Problem List   Diagnosis Date Noted   Serous otitis media 07/03/2023   Antibiotic-induced yeast infection 04/09/2023   Chronic cough 04/03/2023   Breast pain 11/05/2022   Costochondritis 08/19/2022   Tachycardia 06/29/2022   Encounter for female birth control 06/01/2019   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   Arthralgia 06/09/2017   Migraine without aura and without status migrainosus, not intractable 01/24/2016   Seasonal allergic rhinitis due to pollen 01/24/2016   Asthma, mild persistent 03/08/2014   GERD (gastroesophageal reflux disease) 03/08/2014   Seasonal allergies 03/08/2014   Irritable bowel syndrome with diarrhea 03/08/2014   Anxiety and depression 03/08/2014   Chronic daily headache 12/31/2013   Vertigo 12/31/2013   Postconcussive syndrome 12/31/2013    Past Surgical History:  Procedure Laterality Date   CESAREAN SECTION N/A 07/08/2015   Procedure: CESAREAN SECTION;  Surgeon: Noland Fordyce, MD;  Location: [facility];  Service: Obstetrics;   Laterality: N/A;   ESOPHAGOGASTRODUODENOSCOPY     [facility] [id number]   NO PAST SURGERIES      OB History     Gravida  1   Para  1   Term  1   Preterm      AB      Living  1      SAB      IAB      Ectopic      Multiple  0   Live Births  1            Home Medications    Prior to Admission medications   Medication Sig Start Date End Date Taking? Authorizing Provider  azithromycin (ZITHROMAX) 250 MG tablet Take 2 tablets together on day 1, then take 1 tablet daily for 4 days 07/29/23  Yes Makensie Mulhall, Lurena Joiner, PA-C  fluconazole (DIFLUCAN) 150 MG tablet Take 1 tablet (150 mg total) by mouth once as needed for up to 2 doses (take one pill on day 1, and the second pill 3 days later). 07/29/23  Yes Ravenna Legore, Lurena Joiner, PA-C  albuterol (VENTOLIN HFA) 108 (90 Base) MCG/ACT inhaler INHALE 2 PUFFS INTO THE LUNGS EVERY 4 (FOUR) HOURS AS NEEDED FOR WHEEZING. 11/16/21   Christen Butter, NP  amitriptyline (ELAVIL) 25 MG tablet TAKE 1 TABLET BY MOUTH EVERYDAY AT BEDTIME 07/28/23   Thresa Ross, MD  BIOTIN PO Take by mouth.    [provider]  diclofenac sodium (VOLTAREN) 1 % GEL Apply 4 g topically 4 (four) times daily. To affected joint. 06/09/17   Rodolph Bong, MD  EMGALITY 120 MG/ML SOAJ Inject 1 mL into the skin every 30 (thirty) days. 07/25/21   [provider]  fexofenadine (ALLEGRA) 180 MG tablet Take 180 mg by mouth daily.    [provider]  fluticasone (FLONASE) 50 MCG/ACT nasal spray SPRAY 2 SPRAYS INTO EACH NOSTRIL EVERY DAY 12/30/18   Rodolph Bong, MD  fluticasone-salmeterol [facility] INHUB) 250-50 MCG/ACT AEPB Inhale 1 puff into the lungs in the morning and at bedtime. 07/07/23   Charlott Holler, MD  JUNEL 1/20 1-20 MG-MCG tablet TAKE 1 (ONE) TABLET DAILY CONTINUOUS ACTIVE PILLS 07/10/23   Christen Butter, NP  LORazepam (ATIVAN) 0.5 MG tablet TAKE 1 TABLET BY MOUTH EVERY DAY AS NEEDED FOR ANXIETY 07/28/23   Thresa Ross, MD  meclizine (ANTIVERT) 25 MG  tablet Take 1 tablet (25 mg total) by mouth 3 (three) times daily as needed for dizziness. 08/22/21   Christen Butter, NP  Misc Natural Products (ELDERBERRY/VITAMIN C/ZINC PO) Take 1 tablet by mouth daily.    [provider]  montelukast (SINGULAIR) 10 MG tablet Take 1 tablet (10 mg total) by mouth at bedtime. 12/31/22   Christen Butter, NP  omega-3 acid ethyl esters (LOVAZA) 1 g capsule Take by mouth 2 (two) times daily.    [provider]  ondansetron (ZOFRAN-ODT) 4 MG disintegrating tablet Take 1 tablet (4 mg total) by mouth every 8 (eight) hours as needed for nausea or vomiting. 05/09/22   Christen Butter, NP  pantoprazole (PROTONIX) 40 MG tablet TAKE 1 TABLET BY MOUTH EVERY DAY 05/15/23   Christen Butter, NP  Spacer/Aero-Holding Deretha Emory DEVI 2 puffs by Does not apply route in the morning and at bedtime. 01/23/23   Charlott Holler, MD  triamcinolone cream (KENALOG) 0.1 % Apply 1 Application topically 2 (two) times daily. 12/31/22   Christen Butter, NP  UBRELVY 100 MG TABS Take by mouth. 11/22/21   [provider]    Family History Family History  Problem Relation Age of Onset   Hypertension Mother    Cancer Mother        multiple myeloma   Diabetes Mother    Obesity Mother    Asthma Mother    Hypertension Father    Stroke Father    Cancer Maternal Grandmother        CLL   Hypertension Maternal Grandmother    Post-traumatic stress disorder Maternal Uncle    Crohn's disease Cousin        maternal grandfather sister daughter   Colon cancer Neg Hx    Esophageal cancer Neg Hx     Social History Social History   Tobacco Use   Smoking status: Never    Passive exposure: Past   Smokeless tobacco: Never  Vaping Use   Vaping status: Never Used  Substance Use Topics   Alcohol use: No   Drug use: No     Allergies   Catfish [fish allergy], Fish-derived products, Banana, Citric acid, Monosodium glutamate, Pineapple, Promethazine hcl, Stevia glycerite extract [flavoring agent  (non-screening)], Tomato, Strawberry (diagnostic), and Amoxicillin   Review of Systems Review of Systems Per HPI  Physical Exam Triage Vital Signs ED Triage Vitals  Encounter Vitals Group     BP 07/29/23 1859 115/79     Systolic BP Percentile --      Diastolic BP Percentile --  Pulse Rate 07/29/23 1859 89     Resp 07/29/23 1859 16     Temp 07/29/23 1859 98.9 F (37.2 C)     Temp Source 07/29/23 1859 Oral     SpO2 07/29/23 1859 100 %     Weight --      Height --      Head Circumference --      Peak Flow --      Pain Score 07/29/23 1903 5     Pain Loc --      Pain Education --      Exclude from Growth Chart --    No data found.  Updated Vital Signs BP 115/79   Pulse 89   Temp 98.9 F (37.2 C) (Oral)   Resp 16   SpO2 100%   Physical Exam Vitals and nursing note reviewed.  Constitutional:      Appearance: She is not ill-appearing.  HENT:     Right Ear: Tympanic membrane and ear canal normal.     Left Ear: Tympanic membrane and ear canal normal.     Nose: Congestion present. No rhinorrhea.     Mouth/Throat:     Mouth: Mucous membranes are moist.     Pharynx: Oropharynx is clear. No pharyngeal swelling, oropharyngeal exudate or posterior oropharyngeal erythema.     Comments: No tonsils noted although patient denies removal Eyes:     Conjunctiva/sclera: Conjunctivae normal.  Cardiovascular:     Rate and Rhythm: Normal rate and regular rhythm.     Pulses: Normal pulses.     Heart sounds: Normal heart sounds.  Pulmonary:     Effort: Pulmonary effort is normal.     Breath sounds: Normal breath sounds.  Musculoskeletal:     Cervical back: Normal range of motion. No rigidity or tenderness.  Lymphadenopathy:     Cervical: No cervical adenopathy.  Skin:    General: Skin is warm and dry.  Neurological:     Mental Status: She is alert and oriented to person, place, and time.      UC Treatments / Results  Labs (all labs ordered are listed, but only  abnormal results are displayed) Labs Reviewed - No data to display  EKG   Radiology No results found.  Procedures Procedures (including critical care time)  Medications Ordered in UC Medications - No data to display  Initial Impression / Assessment and Plan / UC Course  I have reviewed the triage vital signs and the nursing notes.  Pertinent labs & imaging results that were available during my care of the patient were reviewed by me and considered in my medical decision making (see chart for details).  6 days of progressively worsening symptoms including congestion and sore throat.  Patient reports Z-Pak usually resolves her symptoms.  Sent to pharmacy.  Also gets yeast with antibiotics so have sent fluconazole.  Advise reasons to return to clinic.  Patient agrees to plan  Final Clinical Impressions(s) / UC Diagnoses   Final diagnoses:  Acute bacterial sinusitis   Discharge Instructions   None    ED Prescriptions     Medication Sig Dispense Auth. Provider   azithromycin (ZITHROMAX) 250 MG tablet Take 2 tablets together on day 1, then take 1 tablet daily for 4 days 6 tablet Omayra Tulloch, PA-C   fluconazole (DIFLUCAN) 150 MG tablet Take 1 tablet (150 mg total) by mouth once as needed for up to 2 doses (take one pill on day 1, and the second pill  3 days later). 2 tablet Andre Swander, Lurena Joiner, PA-C      PDMP not reviewed this encounter.   Ngozi Alvidrez, Lurena Joiner, PA-C 07/29/23 2050

## 2023-07-29 NOTE — Discharge Instructions (Signed)
 Antibiotic as directed Fluconazole sent to prevent yeast infection Return if needed

## 2023-08-03 ENCOUNTER — Other Ambulatory Visit: Payer: Self-pay | Admitting: Medical-Surgical

## 2023-08-12 ENCOUNTER — Encounter: Payer: 59 | Attending: Psychology

## 2023-08-12 DIAGNOSIS — F0781 Postconcussional syndrome: Secondary | ICD-10-CM

## 2023-08-12 NOTE — Progress Notes (Addendum)
 Behavioral Observations: The patient appeared well-groomed and appropriately dressed. Her manners were polite and appropriate to the situation. Hearing and vision were adequate for testing demands. The patient walked at a quick and steady pace and no motor abnormalities were observed during testing. Speech was prosodic, fluent, and well-articulated with no word-finding difficulties noted. The patient was engaged and cooperative for the duration of testing and she made a good effort.   Neuropsychology Note  Kelsey [name] completed 150 minutes of neuropsychological testing with technician, Josue Ned, BA, under the supervision of Norleen Asa, PsyD., Clinical Neuropsychologist. The patient did not appear overtly distressed by the testing session, per behavioral observation or via self-report to the technician. Rest breaks were offered.   Clinical Decision Making: In considering the patient's current level of functioning, level of presumed impairment, nature of symptoms, emotional and behavioral responses during clinical interview, level of literacy, and observed level of motivation/effort, a battery of tests was selected by Dr. Asa during initial consultation on 06/24/2023. This was communicated to the technician. Communication between the neuropsychologist and technician was ongoing throughout the testing session and changes were made as deemed necessary based on patient performance on testing, technician observations and additional pertinent factors such as those listed above.  Tests Administered: Automatic Data Edition (BNT-2) Controlled Oral Word Association Test (COWAT; FAS & Animals)  Trail Making Test (TMT; Part A & B) Wechsler Adult Intelligence Scale, 4th Edition (WAIS-IV) Wechsler Memory Scale, 4th Edition (WMS-IV); Adult Battery  Results:  BNT-2:    Raw   Norm Score Percentile Range  [place] Naming Test (BNT-2)  60 t = 61 86 %ile High Average     COWAT: FAS total= 47 Z= 0.29 Animals total= 25 Z= 1.02  TMT:  Trails A time= 16s Percentile Rank= 65th Trails B time= 46s Percentile Rank= 54th   WAIS-IV:   Composite Score Summary  Scale Sum of Scaled Scores Composite Score Percentile Rank 95% Conf. Interval Qualitative Description  Verbal Comprehension 52 VCI 145 99.9 138-149 Very Superior  Perceptual Reasoning 42 PRI 123 94 116-128 Superior  Working Memory 29 WMI 125 95 117-130 Superior  Processing Speed 19 PSI 97 42 89-106 Average  Full Scale 142 FSIQ 130 98 125-133 Very Superior  General Ability 94 GAI 139 99.5 133-143 Very Superior   Verbal Comprehension Subtests Summary  Subtest Raw Score Scaled Score Percentile Rank Reference Group Scaled Score SEM  Similarities 36 19 99.9 19 1.04  Vocabulary 57 19 99.9 19 0.73  Information 20 14 91 14 0.85  The scaled scores in the Reference Group Scaled Score column are based on the performance of examinees aged 20:0-34:11 (i.e., the reference group). See Chapter 6 of the WAIS-IV Technical and Interpretive Manual for more information.  Perceptual Reasoning Subtests Summary  Subtest Raw Score Scaled Score Percentile Rank Reference Group Scaled Score SEM  Block Design 55 13 84 12 0.99  Matrix Reasoning 25 17 99 16 0.95  Visual Puzzles 18 12 75 11 1.04   Working Librarian, academic Raw Score Scaled Score Percentile Rank Reference Group Scaled Score SEM  Digit Span 37 15 95 15 0.73  Arithmetic 19 14 91 14 0.99   Processing Speed Subtests Summary  Subtest Raw Score Scaled Score Percentile Rank Reference Group Scaled Score SEM  Symbol Search 28 8 25 8  1.56  Coding 75 11 63 10 1.20    WMS-IV:   Index Score Summary  Index Sum of Scaled Scores Index Score Percentile  Rank 95% Confidence Interval Tourist information centre manager Memory (AMI) 58 128 97 120-133 Superior  Visual Memory (VMI) 52 119 90 113-124 High Average  Visual Working Memory (VWMI) 25 115  84 107-121 High Average  Immediate Memory (IMI) 53 123 94 116-128 Superior  Delayed Memory (DMI) 57 130 98 121-135 Very Superior    Primary Subtest Scaled Score Summary  Subtest Domain Raw Score Scaled Score Percentile Rank  Logical Memory I AM 32 13 84  Logical Memory II AM 29 13 84  Verbal Paired Associates I AM 55 18 99.6  Verbal Paired Associates II AM 14 14 91  Designs I VM 59 7 16  Designs II VM 63 11 63  Visual Reproduction I VM 43 15 95  Visual Reproduction II VM 43 19 99.9  Spatial Addition VWM 16 11 63  Symbol Span VWM 34 14 91   Auditory Memory Process Score Summary  Process Score Raw Score Scaled Score Percentile Rank Cumulative Percentage (Base Rate)  LM II Recognition 27 - - >75%  VPA II Recognition 40 - - >75%   Visual Memory Process Score Summary  Process Score Raw Score Scaled Score Percentile Rank Cumulative Percentage (Base Rate)  DE I Content 34 9 37 -  DE I Spatial 13 6 9  -  DE II Content 40 13 84 -  DE II Spatial 13 10 50 -  DE II Recognition 20 - - >75%  VR II Recognition 7 - - >75%   ABILITY-MEMORY ANALYSIS  Ability Score:  VCI: 145 Date of Testing:  WAIS-IV; WMS-IV 2023/08/12  Predicted Difference Method   Index Predicted WMS-IV Index Score Actual WMS-IV Index Score Difference Critical Value  Significant Difference Y/N Base Rate  Auditory Memory 123 128 -5 8.91 N   Visual Memory 120 119 1 8.02 N   Visual Working Memory 125 115 10 11.90 N   Immediate Memory 126 123 3 9.48 N   Delayed Memory 123 130 -7 10.18 N   Statistical significance (critical value) at the .01 level.    Feedback to Patient: Kelsey [name] will return on 03/04/2024 for an interactive feedback session with Dr. Corina at which time her test performances, clinical impressions and treatment recommendations will be reviewed in detail. The patient understands she can contact our office should she require our assistance before this time.  150 minutes spent  face-to-face with patient administering standardized tests, 30 minutes spent scoring Radiographer, therapeutic). [CPT [id number], [id number]]  Full report to follow.

## 2023-10-15 ENCOUNTER — Encounter: Payer: Self-pay | Admitting: Psychology
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
## 2023-10-17 ENCOUNTER — Ambulatory Visit
Admission: RE | Admit: 2023-10-17 | Discharge: 2023-10-17 | Disposition: A | Discharge status: Home / Self Care | Source: Ambulatory Visit | Attending: Family Medicine | Admitting: Family Medicine

## 2023-10-17 VITALS — BP 129/77 | HR 106 | Temp 100.2°F | Resp 17

## 2023-10-17 DIAGNOSIS — R509 Fever, unspecified: Secondary | ICD-10-CM

## 2023-10-17 DIAGNOSIS — J111 Influenza due to unidentified influenza virus with other respiratory manifestations: Secondary | ICD-10-CM | POA: Diagnosis not present

## 2023-10-17 DIAGNOSIS — R059 Cough, unspecified: Secondary | ICD-10-CM | POA: Diagnosis not present

## 2023-10-17 LAB — POCT INFLUENZA A/B
Influenza A, POC: NEGATIVE
Influenza B, POC: NEGATIVE

## 2023-10-17 LAB — POC SARS CORONAVIRUS 2 AG -  ED: SARS Coronavirus 2 Ag: NEGATIVE

## 2023-10-17 MED ORDER — ACETAMINOPHEN 500 MG PO TABS
1000.0000 mg | ORAL_TABLET | Freq: Four times a day (QID) | ORAL | Status: DC | PRN
Start: 2023-10-17 — End: 2023-10-17
  Administered 2023-10-17: 1000 mg via ORAL

## 2023-10-17 MED ORDER — DOXYCYCLINE HYCLATE 100 MG PO CAPS: 100.0000 mg | ORAL_CAPSULE | Freq: Two times a day (BID) | ORAL | 0 refills | Status: AC

## 2023-10-17 NOTE — ED Triage Notes (Signed)
 Pt c/o cough since Monday. Nasal congestion and post nasal drainage. Denies fever. Zyrtec and flonase  prn.

## 2023-10-17 NOTE — ED Provider Notes (Signed)
 [name]    CSN: [id number] Arrival date & time: 10/17/23  0900      History   Chief Complaint Chief Complaint  Patient presents with   Cough   Nasal Congestion    HPI Kelsey [name] is a 43 y.o. female.   HPI 43 year old female presents with cough, stuffy nose, green drainage and sore throat for 5 days.  PMH significant for clotting disorder, IBS, and anxiety and depression.  Past Medical History:  Diagnosis Date   Allergy    Anxiety and depression    Asthma    Brain injury (HCC) 2014   Clotting disorder (HCC) 02/17/2022   Depression    Endometriosis    Frequent headaches    GERD (gastroesophageal reflux disease)    Hx of varicella    IBS (irritable bowel syndrome)    Migraines    Panic attacks    Postpartum care following cesarean delivery (2/18) 07/08/2015   Seizure (HCC)    Vertigo 12/31/2013    Patient Active Problem List   Diagnosis Date Noted   Serous otitis media 07/03/2023   Antibiotic-induced yeast infection 04/09/2023   Chronic cough 04/03/2023   Breast pain 11/05/2022   Costochondritis 08/19/2022   Tachycardia 06/29/2022   Encounter for female birth control 06/01/2019   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   Arthralgia 06/09/2017   Migraine without aura and without status migrainosus, not intractable 01/24/2016   Seasonal allergic rhinitis due to pollen 01/24/2016   Asthma, mild persistent 03/08/2014   GERD (gastroesophageal reflux disease) 03/08/2014   Seasonal allergies 03/08/2014   Irritable bowel syndrome with diarrhea 03/08/2014   Anxiety and depression 03/08/2014   Chronic daily headache 12/31/2013   Vertigo 12/31/2013   Postconcussive syndrome 12/31/2013    Past Surgical History:  Procedure Laterality Date   CESAREAN SECTION N/A 07/08/2015   Procedure: CESAREAN SECTION;  Surgeon: Audelia Leaks, MD;  Location: [facility];  Service: Obstetrics;  Laterality: N/A;   ESOPHAGOGASTRODUODENOSCOPY     [facility] [id number]    NO PAST SURGERIES      OB History     Gravida  1   Para  1   Term  1   Preterm      AB      Living  1      SAB      IAB      Ectopic      Multiple  0   Live Births  1            Home Medications    Prior to Admission medications   Medication Sig Start Date End Date Taking? Authorizing Provider  doxycycline (VIBRAMYCIN) 100 MG capsule Take 1 capsule (100 mg total) by mouth 2 (two) times daily for 7 days. 10/17/23 10/24/23 Yes Leonides Ramp, FNP  albuterol  (VENTOLIN  HFA) 108 (90 Base) MCG/ACT inhaler INHALE 2 PUFFS INTO THE LUNGS EVERY 4 (FOUR) HOURS AS NEEDED FOR WHEEZING. 11/16/21   Cherre Cornish, NP  amitriptyline  (ELAVIL ) 25 MG tablet TAKE 1 TABLET BY MOUTH EVERYDAY AT BEDTIME 07/28/23   Wray Heady, MD  BIOTIN PO Take by mouth.    [provider]  diclofenac  sodium (VOLTAREN ) 1 % GEL Apply 4 g topically 4 (four) times daily. To affected joint. 06/09/17   Syliva Even, MD  EMGALITY 120 MG/ML SOAJ Inject 1 mL into the skin every 30 (thirty) days. 07/25/21   [provider]  fexofenadine  (ALLEGRA ) 180 MG tablet Take 180 mg by  mouth daily.    [provider]  fluticasone  (FLONASE ) 50 MCG/ACT nasal spray SPRAY 2 SPRAYS INTO EACH NOSTRIL EVERY DAY 12/30/18   Corey, Evan S, MD  fluticasone -salmeterol (WIXELA INHUB) 250-50 MCG/ACT AEPB Inhale 1 puff into the lungs in the morning and at bedtime. 07/07/23   Desai, Nikita S, MD  JUNEL  1/20 1-20 MG-MCG tablet TAKE 1 (ONE) TABLET DAILY CONTINUOUS ACTIVE PILLS 07/10/23   Cherre Cornish, NP  LORazepam  (ATIVAN ) 0.5 MG tablet TAKE 1 TABLET BY MOUTH EVERY DAY AS NEEDED FOR ANXIETY 07/28/23   Wray Heady, MD  meclizine  (ANTIVERT ) 25 MG tablet Take 1 tablet (25 mg total) by mouth 3 (three) times daily as needed for dizziness. 08/22/21   Cherre Cornish, NP  Misc Natural Products (ELDERBERRY/VITAMIN C/ZINC PO) Take 1 tablet by mouth daily.    [provider]  montelukast  (SINGULAIR ) 10 MG tablet Take 1 tablet  (10 mg total) by mouth at bedtime. 12/31/22   Cherre Cornish, NP  omega-3 acid ethyl esters (LOVAZA) 1 g capsule Take by mouth 2 (two) times daily.    [provider]  ondansetron  (ZOFRAN -ODT) 4 MG disintegrating tablet Take 1 tablet (4 mg total) by mouth every 8 (eight) hours as needed for nausea or vomiting. 05/09/22   Cherre Cornish, NP  pantoprazole  (PROTONIX ) 40 MG tablet TAKE 1 TABLET BY MOUTH EVERY DAY 08/06/23   Cherre Cornish, NP  Spacer/Aero-Holding Idelle Majors DEVI 2 puffs by Does not apply route in the morning and at bedtime. 01/23/23   Desai, Nikita S, MD  triamcinolone  cream (KENALOG ) 0.1 % Apply 1 Application topically 2 (two) times daily. 12/31/22   Cherre Cornish, NP  UBRELVY 100 MG TABS Take by mouth. 11/22/21   [provider]    Family History Family History  Problem Relation Age of Onset   Hypertension Mother    Cancer Mother        multiple myeloma   Diabetes Mother    Obesity Mother    Asthma Mother    Hypertension Father    Stroke Father    Cancer Maternal Grandmother        CLL   Hypertension Maternal Grandmother    Post-traumatic stress disorder Maternal Uncle    Crohn's disease Cousin        maternal grandfather sister daughter   Colon cancer Neg Hx    Esophageal cancer Neg Hx     Social History Social History   Tobacco Use   Smoking status: Never    Passive exposure: Past   Smokeless tobacco: Never  Vaping Use   Vaping status: Never Used  Substance Use Topics   Alcohol use: No   Drug use: No     Allergies   Catfish [fish allergy], Fish-derived products, Banana, Citric acid , Monosodium glutamate, Pineapple, Promethazine  hcl, Stevia glycerite extract [flavoring agent (non-screening)], Tomato, Strawberry (diagnostic), and Amoxicillin   Review of Systems Review of Systems   Physical Exam Triage Vital Signs ED Triage Vitals  Encounter Vitals Group     BP      Systolic BP Percentile      Diastolic BP Percentile      Pulse      Resp       Temp      Temp src      SpO2      Weight      Height      Head Circumference      Peak Flow      Pain  Score      Pain Loc      Pain Education      Exclude from Growth Chart    No data found.  Updated Vital Signs BP 129/77 (BP Location: Right Arm)   Pulse (!) 106   Temp 100.2 F (37.9 C) (Oral)   Resp 17   SpO2 100%    Physical Exam Vitals and nursing note reviewed.  Constitutional:      Appearance: Normal appearance. She is normal weight.  HENT:     Head: Normocephalic and atraumatic.     Right Ear: Tympanic membrane, ear canal and external ear normal.     Left Ear: Tympanic membrane, ear canal and external ear normal.     Mouth/Throat:     Mouth: Mucous membranes are moist.     Pharynx: Oropharynx is clear.  Eyes:     Extraocular Movements: Extraocular movements intact.     Conjunctiva/sclera: Conjunctivae normal.     Pupils: Pupils are equal, round, and reactive to light.  Cardiovascular:     Rate and Rhythm: Normal rate and regular rhythm.     Pulses: Normal pulses.     Heart sounds: Normal heart sounds.  Pulmonary:     Effort: Pulmonary effort is normal.     Breath sounds: Normal breath sounds. No wheezing, rhonchi or rales.  Musculoskeletal:        General: Normal range of motion.     Cervical back: Normal range of motion and neck supple.  Skin:    General: Skin is warm and dry.  Neurological:     General: No focal deficit present.     Mental Status: She is alert and oriented to person, place, and time. Mental status is at baseline.  Psychiatric:        Mood and Affect: Mood normal.        Behavior: Behavior normal.      UC Treatments / Results  Labs (all labs ordered are listed, but only abnormal results are displayed) Labs Reviewed  POC SARS CORONAVIRUS 2 AG -  ED  POCT INFLUENZA A/B    EKG   Radiology No results found.  Procedures Procedures (including critical care time)  Medications Ordered in UC Medications  acetaminophen   (TYLENOL ) tablet 1,000 mg (1,000 mg Oral Given 10/17/23 0914)    Initial Impression / Assessment and Plan / UC Course  I have reviewed the triage vital signs and the nursing notes.  Pertinent labs & imaging results that were available during my care of the patient were reviewed by me and considered in my medical decision making (see chart for details).     MDM: 1.  Cough, unspecified type-Rx'd doxycycline 100 mg capsule: Take 1 capsule twice daily x 7 days; 2.  Fever, unspecified-Advised patient may take OTC Tylenol  1000 mg every 6 hours for fever (oral temperature greater than 100.3).  3.  Influenza-like illness-COVID-19 and influenza A/B both negative this morning. Final Clinical Impressions(s) / UC Diagnoses   Final diagnoses:  Cough, unspecified type  Fever, unspecified  Influenza-like illness     Discharge Instructions      Advised patient to take medication as directed with food to completion.  Advised patient to start antibiotic course once fever has broken.  Advised patient may take OTC Tylenol  1000 mg every 6 hours for fever (oral temperature greater than 100.3).  Encouraged to increase daily water intake to 64 ounces per day while taking these medications.  Advised if symptoms worsen  and/or unresolved please follow-up with your PCP or pulmonology for further evaluation.   ED Prescriptions     Medication Sig Dispense Auth. Provider   doxycycline  (VIBRAMYCIN ) 100 MG capsule Take 1 capsule (100 mg total) by mouth 2 (two) times daily for 7 days. 14 capsule Ein Rijo, FNP      PDMP not reviewed this encounter.   Leonides Ramp, FNP 10/17/23 [phone]

## 2023-10-17 NOTE — Discharge Instructions (Addendum)
 Advised patient to take medication as directed with food to completion.  Advised patient to start antibiotic course once fever has broken.  Advised patient may take OTC Tylenol  1000 mg every 6 hours for fever (oral temperature greater than 100.3).  Encouraged to increase daily water intake to 64 ounces per day while taking these medications.  Advised if symptoms worsen and/or unresolved please follow-up with your PCP or pulmonology for further evaluation.

## 2023-10-25 ENCOUNTER — Ambulatory Visit
Admission: RE | Admit: 2023-10-25 | Discharge: 2023-10-25 | Disposition: A | Discharge status: Home / Self Care | Source: Ambulatory Visit | Attending: Family Medicine | Admitting: Family Medicine

## 2023-10-25 VITALS — BP 111/73 | HR 109 | Temp 98.3°F | Resp 18 | Ht 65.0 in | Wt 165.0 lb

## 2023-10-25 DIAGNOSIS — M25462 Effusion, left knee: Secondary | ICD-10-CM | POA: Diagnosis not present

## 2023-10-25 DIAGNOSIS — M2550 Pain in unspecified joint: Secondary | ICD-10-CM

## 2023-10-25 DIAGNOSIS — M25461 Effusion, right knee: Secondary | ICD-10-CM

## 2023-10-25 DIAGNOSIS — J0101 Acute recurrent maxillary sinusitis: Secondary | ICD-10-CM | POA: Diagnosis not present

## 2023-10-25 MED ORDER — NAPROXEN SODIUM 550 MG PO TABS: 550.0000 mg | ORAL_TABLET | Freq: Two times a day (BID) | ORAL | 0 refills | Status: DC

## 2023-10-25 MED ORDER — FLUCONAZOLE 150 MG PO TABS: 150.0000 mg | ORAL_TABLET | Freq: Every day | ORAL | 0 refills | Status: DC

## 2023-10-25 MED ORDER — AZITHROMYCIN 250 MG PO TABS: ORAL_TABLET | ORAL | 0 refills | Status: DC

## 2023-10-25 MED ORDER — PREDNISONE 20 MG PO TABS: 40.0000 mg | ORAL_TABLET | Freq: Every day | ORAL | 0 refills | Status: DC

## 2023-10-25 NOTE — ED Triage Notes (Signed)
 Patient c/o bilateral knee swelling x 2 weeks.  States that over the past couple of days, there has been some fluid retention in her knees causing pain.  Denies any OTC pain meds.

## 2023-10-25 NOTE — Discharge Instructions (Signed)
 Drink plenty of fluids Continue your Flonase  Take the Z-Pak as directed take Diflucan  if needed for yeast infection symptoms  For the joint pains take prednisone  daily for 5 days.  When you have finished prednisone  take Anaprox 2 times a day with food.  If the joint pains persist, or if the lab work positive,  follow-up with Dr. Sandy Crumb Your labs will be available in MyChart.  You will be called if significant abnormality is found

## 2023-10-25 NOTE — ED Provider Notes (Signed)
 [name]    CSN: [id number] Arrival date & time: 10/25/23  0948      History   Chief Complaint Chief Complaint  Patient presents with   Knee Pain    Swelling in both knees and ankles. Pain and popping in right knee. Sinus infection follow-up - Entered by patient    HPI Kelsey [name] is a 43 y.o. female.   HPI  Patient is here for joint pain.  She states has been going on for a couple of weeks but is worse today.  She has swelling in both of her knees.  She is unable to completely flex them.  She has pain with walking.  She also feels like she has swelling in both of her ankles and pain.  Nothing in the hips or spine, nothing in the upper extremities.  She has had no overuse.  No injury. This happened to her once before and she had a rheumatology lab work workup.  This was done by Dr. Lavanda Porter and sports medicine.  All the lab work was negative.  There is no inflammatory arthritis disease in her family. No recent illness or infection No new medications She has not tried anything for the pain She recently was seen for a sinus infection.  She states that she feels improved but not well.  She took all of the doxycycline .  She continues to have sinus congestion and purulent discharge.  She states a Z-Pak usually works well for her.  I agreed to give her this  Past Medical History:  Diagnosis Date   Allergy    Anxiety and depression    Asthma    Brain injury (HCC) 2014   Clotting disorder (HCC) 02/17/2022   Depression    Endometriosis    Frequent headaches    GERD (gastroesophageal reflux disease)    Hx of varicella    IBS (irritable bowel syndrome)    Migraines    Panic attacks    Postpartum care following cesarean delivery (2/18) 07/08/2015   Seizure (HCC)    Vertigo 12/31/2013    Patient Active Problem List   Diagnosis Date Noted   Serous otitis media 07/03/2023   Antibiotic-induced yeast infection 04/09/2023   Chronic cough 04/03/2023   Breast  pain 11/05/2022   Costochondritis 08/19/2022   Tachycardia 06/29/2022   Encounter for female birth control 06/01/2019   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   Arthralgia 06/09/2017   Migraine without aura and without status migrainosus, not intractable 01/24/2016   Seasonal allergic rhinitis due to pollen 01/24/2016   Asthma, mild persistent 03/08/2014   GERD (gastroesophageal reflux disease) 03/08/2014   Seasonal allergies 03/08/2014   Irritable bowel syndrome with diarrhea 03/08/2014   Anxiety and depression 03/08/2014   Chronic daily headache 12/31/2013   Vertigo 12/31/2013   Postconcussive syndrome 12/31/2013    Past Surgical History:  Procedure Laterality Date   CESAREAN SECTION N/A 07/08/2015   Procedure: CESAREAN SECTION;  Surgeon: Audelia Leaks, MD;  Location: [facility];  Service: Obstetrics;  Laterality: N/A;   ESOPHAGOGASTRODUODENOSCOPY     [facility] [id number]   NO PAST SURGERIES      OB History     Gravida  1   Para  1   Term  1   Preterm      AB      Living  1      SAB      IAB      Ectopic  Multiple  0   Live Births  1            Home Medications    Prior to Admission medications   Medication Sig Start Date End Date Taking? Authorizing Provider  albuterol  (VENTOLIN  HFA) 108 (90 Base) MCG/ACT inhaler INHALE 2 PUFFS INTO THE LUNGS EVERY 4 (FOUR) HOURS AS NEEDED FOR WHEEZING. 11/16/21  Yes Cherre Cornish, NP  amitriptyline  (ELAVIL ) 25 MG tablet TAKE 1 TABLET BY MOUTH EVERYDAY AT BEDTIME 07/28/23  Yes Wray Heady, MD  azithromycin  (ZITHROMAX  Z-PAK) 250 MG tablet Take two pills today followed by one a day until gone 10/25/23  Yes Stephany Ehrich, MD  BIOTIN PO Take by mouth.   Yes [provider]  diclofenac  sodium (VOLTAREN ) 1 % GEL Apply 4 g topically 4 (four) times daily. To affected joint. 06/09/17  Yes Syliva Even, MD  EMGALITY 120 MG/ML SOAJ Inject 1 mL into the skin every 30 (thirty) days. 07/25/21  Yes [provider]  fexofenadine  (ALLEGRA ) 180 MG tablet Take 180 mg by mouth daily.   Yes [provider]  fluconazole  (DIFLUCAN ) 150 MG tablet Take 1 tablet (150 mg total) by mouth daily. Repeat in 1 week if needed 10/25/23  Yes Stephany Ehrich, MD  fluticasone  (FLONASE ) 50 MCG/ACT nasal spray SPRAY 2 SPRAYS INTO EACH NOSTRIL EVERY DAY 12/30/18  Yes Corey, Evan S, MD  fluticasone -salmeterol (WIXELA INHUB) 250-50 MCG/ACT AEPB Inhale 1 puff into the lungs in the morning and at bedtime. 07/07/23  Yes Aleck Hurdle, MD  JUNEL  1/20 1-20 MG-MCG tablet TAKE 1 (ONE) TABLET DAILY CONTINUOUS ACTIVE PILLS 07/10/23  Yes Cherre Cornish, NP  LORazepam  (ATIVAN ) 0.5 MG tablet TAKE 1 TABLET BY MOUTH EVERY DAY AS NEEDED FOR ANXIETY 07/28/23  Yes Wray Heady, MD  meclizine  (ANTIVERT ) 25 MG tablet Take 1 tablet (25 mg total) by mouth 3 (three) times daily as needed for dizziness. 08/22/21  Yes Cherre Cornish, NP  Misc Natural Products (ELDERBERRY/VITAMIN C/ZINC PO) Take 1 tablet by mouth daily.   Yes [provider]  montelukast  (SINGULAIR ) 10 MG tablet Take 1 tablet (10 mg total) by mouth at bedtime. 12/31/22  Yes Cherre Cornish, NP  naproxen sodium (ANAPROX DS) 550 MG tablet Take 1 tablet (550 mg total) by mouth 2 (two) times daily with a meal. 10/25/23  Yes Stephany Ehrich, MD  omega-3 acid ethyl esters (LOVAZA) 1 g capsule Take by mouth 2 (two) times daily.   Yes [provider]  pantoprazole  (PROTONIX ) 40 MG tablet TAKE 1 TABLET BY MOUTH EVERY DAY 08/06/23  Yes Cherre Cornish, NP  predniSONE  (DELTASONE ) 20 MG tablet Take 2 tablets (40 mg total) by mouth daily with breakfast. 10/25/23  Yes Stephany Ehrich, MD  Spacer/Aero-Holding Idelle Majors DEVI 2 puffs by Does not apply route in the morning and at bedtime. 01/23/23  Yes Desai, Nikita S, MD  triamcinolone  cream (KENALOG ) 0.1 % Apply 1 Application topically 2 (two) times daily. 12/31/22  Yes Jessup, Joy, NP  UBRELVY 100 MG TABS Take by mouth. 11/22/21  Yes  [provider]    Family History Family History  Problem Relation Age of Onset   Hypertension Mother    Cancer Mother        multiple myeloma   Diabetes Mother    Obesity Mother    Asthma Mother    Hypertension Father    Stroke Father    Cancer Maternal Grandmother  CLL   Hypertension Maternal Grandmother    Post-traumatic stress disorder Maternal Uncle    Crohn's disease Cousin        maternal grandfather sister daughter   Colon cancer Neg Hx    Esophageal cancer Neg Hx     Social History Social History   Tobacco Use   Smoking status: Never    Passive exposure: Past   Smokeless tobacco: Never  Vaping Use   Vaping status: Never Used  Substance Use Topics   Alcohol use: No   Drug use: No     Allergies   Catfish [fish allergy], Fish-derived products, Banana, Citric acid , Monosodium glutamate, Pineapple, Promethazine  hcl, Stevia glycerite extract [flavoring agent (non-screening)], Tomato, Strawberry (diagnostic), and Amoxicillin   Review of Systems Review of Systems  See HPI Physical Exam Triage Vital Signs ED Triage Vitals  Encounter Vitals Group     BP 10/25/23 0956 111/73     Systolic BP Percentile --      Diastolic BP Percentile --      Pulse Rate 10/25/23 0956 (!) 109     Resp 10/25/23 0956 18     Temp 10/25/23 0956 98.3 F (36.8 C)     Temp Source 10/25/23 0956 Oral     SpO2 10/25/23 0956 100 %     Weight 10/25/23 0958 165 lb (74.8 kg)     Height 10/25/23 0958 5\' 5"  (1.651 m)     Head Circumference --      Peak Flow --      Pain Score 10/25/23 0957 3     Pain Loc --      Pain Education --      Exclude from Growth Chart --    No data found.  Updated Vital Signs BP 111/73 (BP Location: Right Arm)   Pulse (!) 109   Temp 98.3 F (36.8 C) (Oral)   Resp 18   Ht 5\' 5"  (1.651 m)   Wt 74.8 kg   SpO2 100%   BMI 27.46 kg/m       Physical Exam Constitutional:      General: She is not in acute distress.    Appearance:  Normal appearance. She is well-developed and normal weight.  HENT:     Head: Normocephalic and atraumatic.     Right Ear: Tympanic membrane normal.     Left Ear: Tympanic membrane normal.     Nose: Nose normal.     Mouth/Throat:     Pharynx: No posterior oropharyngeal erythema.  Eyes:     Conjunctiva/sclera: Conjunctivae normal.     Pupils: Pupils are equal, round, and reactive to light.  Cardiovascular:     Rate and Rhythm: Normal rate.  Pulmonary:     Effort: Pulmonary effort is normal. No respiratory distress.  Abdominal:     General: There is no distension.     Palpations: Abdomen is soft.  Musculoskeletal:        General: Swelling and tenderness present. No deformity or signs of injury. Normal range of motion.     Cervical back: Normal range of motion.     Comments: Patient has effusions in her knees right greater than left.  Can only flex the left knee to 75 degrees, can flex the right knee to 90 degrees.  Tenderness in the medial joint line mild.  No patellofemoral grind.  No instability ankles did not show gross effusion although this is difficult for me to ascertain.  There is some warmth and puffiness  in the ankle joints.  Hands and feet are examined, there is no synovitis or swelling of the IP joints  Skin:    General: Skin is warm and dry.  Neurological:     Mental Status: She is alert.     Gait: Gait abnormal.      UC Treatments / Results  Labs (all labs ordered are listed, but only abnormal results are displayed) Labs Reviewed  CBC WITH DIFFERENTIAL/PLATELET  SEDIMENTATION RATE    EKG   Radiology No results found.  Procedures Procedures (including critical care time)  Medications Ordered in UC Medications - No data to display  Initial Impression / Assessment and Plan / UC Course  I have reviewed the triage vital signs and the nursing notes.  Pertinent labs & imaging results that were available during my care of the patient were reviewed by me and  considered in my medical decision making (see chart for details).     Concern for bilateral arthralgias in lower extremities.  Will get a CBC and sed rate.  She has had a full rheumatology workup in the past from not going to repeat this at this time.  I am going to treat her with a few days of prednisone  followed by naproxen.  If this does not take care of the knee effusion and pain she needs to follow-up with the sports medicine provider in her office Final Clinical Impressions(s) / UC Diagnoses   Final diagnoses:  Arthralgia of multiple joints  Bilateral knee effusions  Acute recurrent maxillary sinusitis     Discharge Instructions      Drink plenty of fluids Continue your Flonase  Take the Z-Pak as directed take Diflucan  if needed for yeast infection symptoms  For the joint pains take prednisone  daily for 5 days.  When you have finished prednisone  take Anaprox 2 times a day with food.  If the joint pains persist, or if the lab work positive,  follow-up with Dr. Sandy Crumb Your labs will be available in MyChart.  You will be called if significant abnormality is found   ED Prescriptions     Medication Sig Dispense Auth. Provider   azithromycin  (ZITHROMAX  Z-PAK) 250 MG tablet Take two pills today followed by one a day until gone 6 tablet Stephany Ehrich, MD   predniSONE  (DELTASONE ) 20 MG tablet Take 2 tablets (40 mg total) by mouth daily with breakfast. 10 tablet Stephany Ehrich, MD   fluconazole  (DIFLUCAN ) 150 MG tablet Take 1 tablet (150 mg total) by mouth daily. Repeat in 1 week if needed 2 tablet Stephany Ehrich, MD   naproxen sodium (ANAPROX DS) 550 MG tablet Take 1 tablet (550 mg total) by mouth 2 (two) times daily with a meal. 30 tablet Stephany Ehrich, MD      PDMP not reviewed this encounter.   Stephany Ehrich, MD 10/25/23 [phone]

## 2023-10-29 ENCOUNTER — Telehealth: Payer: Self-pay

## 2023-10-29 NOTE — Telephone Encounter (Signed)
 [facility] called wanting to speak to cma who handled pt's care, Elridge Haller notified.

## 2023-10-30 LAB — CBC WITH DIFFERENTIAL/PLATELET
Basophils Absolute: 0 10*3/uL (ref 0.0–0.2)
Basos: 1 %
EOS (ABSOLUTE): 0.1 10*3/uL (ref 0.0–0.4)
Eos: 2 %
Hematocrit: 45.6 % (ref 34.0–46.6)
Hemoglobin: 14.7 g/dL (ref 11.1–15.9)
Immature Grans (Abs): 0 10*3/uL (ref 0.0–0.1)
Immature Granulocytes: 0 %
Lymphocytes Absolute: 1.8 10*3/uL (ref 0.7–3.1)
Lymphs: 39 %
MCH: 28.5 pg (ref 26.6–33.0)
MCHC: 32.2 g/dL (ref 31.5–35.7)
MCV: 89 fL (ref 79–97)
Monocytes Absolute: 0.3 10*3/uL (ref 0.1–0.9)
Monocytes: 5 %
Neutrophils Absolute: 2.4 10*3/uL (ref 1.4–7.0)
Neutrophils: 53 %
Platelets: 299 10*3/uL (ref 150–450)
RBC: 5.15 x10E6/uL (ref 3.77–5.28)
RDW: 12.1 % (ref 11.7–15.4)
WBC: 4.6 10*3/uL (ref 3.4–10.8)

## 2023-10-30 LAB — SEDIMENTATION RATE: Sed Rate: 12 mm/h (ref 0–32)

## 2023-10-30 LAB — SPECIMEN STATUS REPORT

## 2023-10-31 ENCOUNTER — Other Ambulatory Visit: Payer: Self-pay | Admitting: Medical-Surgical

## 2023-11-04 ENCOUNTER — Ambulatory Visit

## 2023-11-04 ENCOUNTER — Ambulatory Visit (INDEPENDENT_AMBULATORY_CARE_PROVIDER_SITE_OTHER): Admitting: Sports Medicine

## 2023-11-04 DIAGNOSIS — J320 Chronic maxillary sinusitis: Secondary | ICD-10-CM | POA: Insufficient documentation

## 2023-11-04 DIAGNOSIS — M25562 Pain in left knee: Secondary | ICD-10-CM

## 2023-11-04 DIAGNOSIS — M25561 Pain in right knee: Secondary | ICD-10-CM

## 2023-11-04 DIAGNOSIS — M25461 Effusion, right knee: Secondary | ICD-10-CM | POA: Diagnosis not present

## 2023-11-04 DIAGNOSIS — M25462 Effusion, left knee: Secondary | ICD-10-CM | POA: Diagnosis not present

## 2023-11-04 DIAGNOSIS — J0100 Acute maxillary sinusitis, unspecified: Secondary | ICD-10-CM

## 2023-11-04 MED ORDER — AMOXICILLIN-POT CLAVULANATE 875-125 MG PO TABS: 1.0000 | ORAL_TABLET | Freq: Two times a day (BID) | ORAL | 0 refills | Status: DC

## 2023-11-04 MED ORDER — PREDNISONE 50 MG PO TABS: ORAL_TABLET | ORAL | 0 refills | Status: DC

## 2023-11-04 NOTE — Assessment & Plan Note (Signed)
 Very pleasant 43 year old female, this weekend she went to a wedding, over a couple of days she started to have scratchy throat, this evolved into facial pain and pressure with radiation to the ears and upper teeth. She also developed some aching in her knees as well as swelling. She was seen in urgent care twice, she was treated with doxycycline . An ESR was drawn that was negative. She has started to improve slightly but still has significant facial pain and pressure. On exam oropharyngeal exam, nose, ear canals normal. Cardiopulmonary examination is normal. Small effusion in the knees, we will get x-rays, will do 5 days of prednisone , Augmentin. Return to see me in 2 to 3 weeks. (Amoxicillin noted in allergy list, side effect is more gastrointestinal which is normal with penicillin-based antibiotics so I suspect more of an intolerance rather than an allergy.)

## 2023-11-04 NOTE — Progress Notes (Signed)
    Procedures performed today:    None.  Independent interpretation of notes and tests performed by another provider:   None.  Brief History, Exam, Impression, and Recommendations:    Maxillary sinusitis Very pleasant 43 year old female, this weekend she went to a wedding, over a couple of days she started to have scratchy throat, this evolved into facial pain and pressure with radiation to the ears and upper teeth. She also developed some aching in her knees as well as swelling. She was seen in urgent care twice, she was treated with doxycycline . An ESR was drawn that was negative. She has started to improve slightly but still has significant facial pain and pressure. On exam oropharyngeal exam, nose, ear canals normal. Cardiopulmonary examination is normal. Small effusion in the knees, we will get x-rays, will do 5 days of prednisone , Augmentin. Return to see me in 2 to 3 weeks. (Amoxicillin noted in allergy list, side effect is more gastrointestinal which is normal with penicillin-based antibiotics so I suspect more of an intolerance rather than an allergy.)    ____________________________________________ Joselyn Nicely. Sandy Crumb, M.D., ABFM., CAQSM., AME. Primary Care and Sports Medicine [facility]  Adjunct Professor of [facility] Medicine  [employer]  Restaurant manager, fast food

## 2023-11-13 ENCOUNTER — Ambulatory Visit: Payer: Self-pay | Admitting: Sports Medicine

## 2023-11-17 ENCOUNTER — Telehealth (HOSPITAL_COMMUNITY): Admitting: Psychiatry

## 2023-11-19 ENCOUNTER — Encounter (HOSPITAL_COMMUNITY): Payer: Self-pay | Admitting: Psychiatry

## 2023-11-19 ENCOUNTER — Telehealth (HOSPITAL_COMMUNITY): Admitting: Psychiatry

## 2023-11-19 DIAGNOSIS — F5102 Adjustment insomnia: Secondary | ICD-10-CM

## 2023-11-19 DIAGNOSIS — F410 Panic disorder [episodic paroxysmal anxiety] without agoraphobia: Secondary | ICD-10-CM | POA: Diagnosis not present

## 2023-11-19 DIAGNOSIS — F419 Anxiety disorder, unspecified: Secondary | ICD-10-CM | POA: Diagnosis not present

## 2023-11-19 MED ORDER — LORAZEPAM 0.5 MG PO TABS: ORAL_TABLET | ORAL | 0 refills | Status: DC

## 2023-11-19 MED ORDER — AMITRIPTYLINE HCL 25 MG PO TABS: ORAL_TABLET | ORAL | 2 refills | Status: DC

## 2023-11-19 NOTE — Progress Notes (Signed)
 [facility] Follow up visit  Patient Identification: Kelsey [name] MRN:  [id number] Date of Evaluation:  11/19/2023 Referral Source: GI  Chief Complaint:  follow up sleep, anxiety Visit Diagnosis:    ICD-10-CM   1. Anxiety disorder, unspecified type  F41.9     2. Panic attacks  F41.0     3. Adjustment insomnia  F51.02       Virtual Visit via Video Note  I connected with Kelsey [name] on 11/19/23 at  9:00 AM EDT by a video enabled telemedicine application and verified that I am speaking with the correct person using two identifiers.  Location: Patient: home Provider: home office   I discussed the limitations of evaluation and management by telemedicine and the availability of in person appointments. The patient expressed understanding and agreed to proceed.      I discussed the assessment and treatment plan with the patient. The patient was provided an opportunity to ask questions and all were answered. The patient agreed with the plan and demonstrated an understanding of the instructions.   The patient was advised to call back or seek an in-person evaluation if the symptoms worsen or if the condition fails to improve as anticipated.  I provided 16 minutes of non-face-to-face time during this encounter.    History of Present Illness: Patient is a 43 years old currently married African-American female referred by GI doctor for establishment of care for possible anxiety.  Patient has been suffering from irritable bowel syndrome She also has a head injury in the has migraines and had been on disability since 2015  On evaluation today doing fair she has got a job and son diagnosed with autism she is homeschooling in general she is managing the stress and medical therapy is going on there as well she has seldom taken an Ativan  for panic attacks  She still takes Elavil  for migraines, depression, sleep there is no reported side effects    Aggravating  factor: presentations,  past head  injury, relationship can be, long covid Modifying factor: Family Severity  manageable  Duration more so last few years   Past Psychiatric History: anxiety  Consequences of Substance Abuse: NA  Past Medical History:  Past Medical History:  Diagnosis Date   Allergy    Anxiety and depression    Asthma    Brain injury (HCC) 2014   Clotting disorder (HCC) 02/17/2022   Depression    Endometriosis    Frequent headaches    GERD (gastroesophageal reflux disease)    Hx of varicella    IBS (irritable bowel syndrome)    Migraines    Panic attacks    Postpartum care following cesarean delivery (2/18) 07/08/2015   Seizure (HCC)    Vertigo 12/31/2013    Past Surgical History:  Procedure Laterality Date   CESAREAN SECTION N/A 07/08/2015   Procedure: CESAREAN SECTION;  Surgeon: Burnard Bowers, MD;  Location: [facility];  Service: Obstetrics;  Laterality: N/A;   ESOPHAGOGASTRODUODENOSCOPY     [facility] 2009-2010   NO PAST SURGERIES      Family Psychiatric History: Mom: OCD possibel  Family History:  Family History  Problem Relation Age of Onset   Hypertension Mother    Cancer Mother        multiple myeloma   Diabetes Mother    Obesity Mother    Asthma Mother    Hypertension Father    Stroke Father    Cancer Maternal Grandmother  CLL   Hypertension Maternal Grandmother    Post-traumatic stress disorder Maternal Uncle    Crohn's disease Cousin        maternal grandfather sister daughter   Colon cancer Neg Hx    Esophageal cancer Neg Hx     Social History:   Social History   Socioeconomic History   Marital status: Married    Spouse name: Not on file   Number of children: 0   Years of education: Not on file   Highest education level: Master's degree (e.g., MA, MS, MEng, MEd, MSW, MBA)  Occupational History   Not on file  Tobacco Use   Smoking status: Never    Passive exposure: Past   Smokeless tobacco: Never  Vaping Use   Vaping status: Never Used   Substance and Sexual Activity   Alcohol use: No   Drug use: No   Sexual activity: Yes    Partners: Male    Birth control/protection: Pill  Other Topics Concern   Not on file  Social History Narrative   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      Home Situation: lives with husband      Spiritual Beliefs: Christian      Lifestyle: doing 20 minutes of exercise and a little weight lifting daily; diet is healthy            Social Drivers of Corporate investment banker Strain: Low Risk  (04/03/2023)   Overall Financial Resource Strain (CARDIA)    Difficulty of Paying Living Expenses: Not hard at all  Food Insecurity: No Food Insecurity (04/03/2023)   Hunger Vital Sign    Worried About Running Out of Food in the Last Year: Never true    Ran Out of Food in the Last Year: Never true  Transportation Needs: No Transportation Needs (04/03/2023)   PRAPARE - Administrator, Civil Service (Medical): No    Lack of Transportation (Non-Medical): No  Physical Activity: Insufficiently Active (04/03/2023)   Exercise Vital Sign    Days of Exercise per Week: 1 day    Minutes of Exercise per Session: 10 min  Stress: No Stress Concern Present (04/03/2023)   [employer] of Occupational Health - Occupational Stress Questionnaire    Feeling of Stress : Only a little  Social Connections: Moderately Integrated (04/03/2023)   Social Connection and Isolation Panel    Frequency of Communication with Friends and Family: Once a week    Frequency of Social Gatherings with Friends and Family: Never    Attends Religious Services: More than 4 times per year    Active Member of [employer] or Organizations: Yes    Attends Engineer, structural: More than 4 times per year    Marital Status: Married     Allergies:   Allergies  Allergen Reactions   Catfish [Fish Allergy] Shortness Of Breath   Fish-Derived Products Shortness Of Breath   Banana Hives   Citric Acid  Hives    Monosodium Glutamate Other (See Comments)    Headache    Pineapple Hives   Promethazine  Hcl Other (See Comments)    Restless motion   Stevia Glycerite Extract [Flavoring Agent (Non-Screening)] Other (See Comments)    Mouth sores Headache   Tomato Other (See Comments)   Strawberry (Diagnostic) Other (See Comments)   Amoxicillin  Other (See Comments)    Stomach pain    Metabolic Disorder Labs: Lab Results  Component Value Date   HGBA1C 5.2 03/08/2014  No results found for: PROLACTIN Lab Results  Component Value Date   CHOL 152 02/19/2023   TRIG 75 02/19/2023   HDL 43 02/19/2023   CHOLHDL 3.5 02/19/2023   VLDL 88.5 03/08/2014   LDLCALC 94 02/19/2023   LDLCALC 103 (H) 02/09/2021   Lab Results  Component Value Date   TSH 0.72 07/22/2022    Therapeutic Level Labs: No results found for: LITHIUM No results found for: CBMZ No results found for: VALPROATE  Current Medications: Current Outpatient Medications  Medication Sig Dispense Refill   albuterol  (VENTOLIN  HFA) 108 (90 Base) MCG/ACT inhaler INHALE 2 PUFFS INTO THE LUNGS EVERY 4 (FOUR) HOURS AS NEEDED FOR WHEEZING. 8 g 1   amitriptyline  (ELAVIL ) 25 MG tablet TAKE 1 TABLET BY MOUTH EVERYDAY AT BEDTIME 30 tablet 2   amoxicillin -clavulanate (AUGMENTIN ) 875-125 MG tablet Take 1 tablet by mouth 2 (two) times daily. 20 tablet 0   azithromycin  (ZITHROMAX  Z-PAK) 250 MG tablet Take two pills today followed by one a day until gone (Patient not taking: Reported on 11/04/2023) 6 tablet 0   BIOTIN PO Take by mouth.     diclofenac  sodium (VOLTAREN ) 1 % GEL Apply 4 g topically 4 (four) times daily. To affected joint. 100 g 11   EMGALITY 120 MG/ML SOAJ Inject 1 mL into the skin every 30 (thirty) days.     fexofenadine  (ALLEGRA ) 180 MG tablet Take 180 mg by mouth daily.     fluconazole  (DIFLUCAN ) 150 MG tablet Take 1 tablet (150 mg total) by mouth daily. Repeat in 1 week if needed 2 tablet 0   fluticasone  (FLONASE ) 50 MCG/ACT  nasal spray SPRAY 2 SPRAYS INTO EACH NOSTRIL EVERY DAY 16 mL 12   fluticasone -salmeterol (WIXELA INHUB) 250-50 MCG/ACT AEPB Inhale 1 puff into the lungs in the morning and at bedtime. 60 each 0   JUNEL  1/20 1-20 MG-MCG tablet TAKE 1 (ONE) TABLET DAILY CONTINUOUS ACTIVE PILLS 84 tablet 3   LORazepam  (ATIVAN ) 0.5 MG tablet TAKE 1 TABLET BY MOUTH EVERY DAY AS NEEDED FOR ANXIETY 30 tablet 0   meclizine  (ANTIVERT ) 25 MG tablet Take 1 tablet (25 mg total) by mouth 3 (three) times daily as needed for dizziness. 30 tablet 2   Misc Natural Products (ELDERBERRY/VITAMIN C/ZINC PO) Take 1 tablet by mouth daily.     montelukast  (SINGULAIR ) 10 MG tablet Take 1 tablet (10 mg total) by mouth at bedtime. 90 tablet 3   naproxen  sodium (ANAPROX  DS) 550 MG tablet Take 1 tablet (550 mg total) by mouth 2 (two) times daily with a meal. 30 tablet 0   omega-3 acid ethyl esters (LOVAZA) 1 g capsule Take by mouth 2 (two) times daily.     pantoprazole  (PROTONIX ) 40 MG tablet TAKE 1 TABLET BY MOUTH EVERY DAY 30 tablet 2   predniSONE  (DELTASONE ) 50 MG tablet One tab PO daily for 5 days. 5 tablet 0   Spacer/Aero-Holding Chambers DEVI 2 puffs by Does not apply route in the morning and at bedtime. 1 each 11   triamcinolone  cream (KENALOG ) 0.1 % Apply 1 Application topically 2 (two) times daily. 30 g 0   UBRELVY 100 MG TABS Take by mouth.     No current facility-administered medications for this visit.     Psychiatric Specialty Exam: Review of Systems  Cardiovascular:  Negative for chest pain.  Neurological:  Negative for tremors.  Psychiatric/Behavioral:  Negative for self-injury.     There were no vitals taken for this visit.There is no height  or weight on file to calculate BMI.  General Appearance: casual  Eye Contact:  fair  Speech:  Normal Rate  Volume:  Normal  Mood: fair  Affect:  Full Range  Thought Process:  Goal Directed  Orientation:  Full (Time, Place, and Person)  Thought Content:  Rumination  Suicidal  Thoughts:  No  Homicidal Thoughts:  No  Memory:  Immediate;   Fair Recent;   Fair  Judgement:  Fair  Insight:  Fair  Psychomotor Activity:  Normal  Concentration:  Concentration: Good and Attention Span: Good  Recall:  Good  Fund of Knowledge:Good  Language: Good  Akathisia:  No  Handed:    AIMS (if indicated):  not done  Assets:  Communication Skills Desire for Improvement Financial Resources/Insurance Housing  ADL's:  Intact  Cognition: WNL  Sleep:  Fair   Screenings: GAD-7    Flowsheet Row Office Visit from 05/09/2022 in [facility] Primary Care & Sports Medicine at [facility] Office Visit from 08/16/2021 in [facility] Primary Care & Sports Medicine at [facility] Office Visit from 11/19/2017 in [facility] Primary Care & Sports Medicine at [facility]  Total GAD-7 Score 0 0 3   PHQ2-9    Flowsheet Row Office Visit from 12/31/2022 in [facility] Primary Care & Sports Medicine at [facility] Office Visit from 09/30/2022 in [facility] Primary Care & Sports Medicine at [facility] Office Visit from 07/22/2022 in [facility] Primary Care & Sports Medicine at [facility] Office Visit from 05/09/2022 in [facility] Primary Care & Sports Medicine at [facility] Office Visit from 08/16/2021 in [facility] Primary Care & Sports Medicine at [facility]  PHQ-2 Total Score 0 1 0 2 0  PHQ-9 Total Score -- -- -- 4 2   Flowsheet Row UC from 10/25/2023 in [facility] at [place] UC from 10/17/2023 in [facility] at [place] [facility] from 07/29/2023 in [facility] at [facility] RISK CATEGORY No Risk No Risk No Risk    Assessment and Plan: as follows Prior documentation reviewed    Panic attacks; sporadic and manageable continue breathing techniques and Ativan  as needed   anxiety unspecified: fair continue ativan  prn Insomnia: Insomnia is manageable with sleep  hygiene and imipramine there is no reported side effects  Medication renewed questions addressed follow-up in 5 to 6 months or earlier if needed Jackey Flight, MD 7/2/20259:05 AM

## 2023-11-23 ENCOUNTER — Ambulatory Visit
Admission: RE | Admit: 2023-11-23 | Discharge: 2023-11-23 | Disposition: A | Discharge status: Home / Self Care | Source: Ambulatory Visit | Attending: Family Medicine | Admitting: Family Medicine

## 2023-11-23 VITALS — BP 138/84 | HR 99 | Temp 99.8°F | Resp 18 | Ht 65.0 in | Wt 165.0 lb

## 2023-11-23 DIAGNOSIS — S0501XA Injury of conjunctiva and corneal abrasion without foreign body, right eye, initial encounter: Secondary | ICD-10-CM | POA: Diagnosis not present

## 2023-11-23 DIAGNOSIS — H5789 Other specified disorders of eye and adnexa: Secondary | ICD-10-CM

## 2023-11-23 MED ORDER — ERYTHROMYCIN 5 MG/GM OP OINT: 1.0000 | TOPICAL_OINTMENT | Freq: Three times a day (TID) | OPHTHALMIC | 0 refills | Status: DC

## 2023-11-23 NOTE — ED Triage Notes (Signed)
 Patient states that she awoke with a red spot on her right eye this am.  No injury.  Patient did tried to rinse her eye out and it was extremely painful.  Does wear glasses.

## 2023-11-23 NOTE — Discharge Instructions (Signed)
 Use the eye ointment 3 times a day until the discomfort goes away See your eye doctor if not improving by next week

## 2023-11-23 NOTE — ED Provider Notes (Signed)
 [name]    CSN: [id number] Arrival date & time: 11/23/23  9061      History   Chief Complaint Chief Complaint  Patient presents with   Eye Problem    Red spot and small bump in right eye that BURNS all the time, worse when it contacts water, saline eye wash or my own tears. - Entered by patient    HPI Kelsey [name] is a 43 y.o. female.   Woke up this morning with right eye redness and discomfort.  Uncomfortable when she tries to use an eyedrop.  Does have a history of dry eyes.  Does not recall any trauma.  No foreign body sensation.  Vision is normal.  Mild increased tearing.  No matting or discharge    Past Medical History:  Diagnosis Date   Allergy    Anxiety and depression    Asthma    Brain injury (HCC) 2014   Clotting disorder (HCC) 02/17/2022   Depression    Endometriosis    Frequent headaches    GERD (gastroesophageal reflux disease)    Hx of varicella    IBS (irritable bowel syndrome)    Migraines    Panic attacks    Postpartum care following cesarean delivery (2/18) 07/08/2015   Seizure (HCC)    Vertigo 12/31/2013    Patient Active Problem List   Diagnosis Date Noted   Maxillary sinusitis 11/04/2023   Serous otitis media 07/03/2023   Antibiotic-induced yeast infection 04/09/2023   Chronic cough 04/03/2023   Breast pain 11/05/2022   Costochondritis 08/19/2022   Tachycardia 06/29/2022   Encounter for female birth control 06/01/2019   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   Arthralgia 06/09/2017   Migraine without aura and without status migrainosus, not intractable 01/24/2016   Seasonal allergic rhinitis due to pollen 01/24/2016   Asthma, mild persistent 03/08/2014   GERD (gastroesophageal reflux disease) 03/08/2014   Seasonal allergies 03/08/2014   Irritable bowel syndrome with diarrhea 03/08/2014   Anxiety and depression 03/08/2014   Chronic daily headache 12/31/2013   Vertigo 12/31/2013   Postconcussive syndrome 12/31/2013    Past  Surgical History:  Procedure Laterality Date   CESAREAN SECTION N/A 07/08/2015   Procedure: CESAREAN SECTION;  Surgeon: Burnard Bowers, MD;  Location: [facility];  Service: Obstetrics;  Laterality: N/A;   ESOPHAGOGASTRODUODENOSCOPY     [facility] 2009-2010   NO PAST SURGERIES      OB History     Gravida  1   Para  1   Term  1   Preterm      AB      Living  1      SAB      IAB      Ectopic      Multiple  0   Live Births  1            Home Medications    Prior to Admission medications   Medication Sig Start Date End Date Taking? Authorizing Provider  albuterol  (VENTOLIN  HFA) 108 (90 Base) MCG/ACT inhaler INHALE 2 PUFFS INTO THE LUNGS EVERY 4 (FOUR) HOURS AS NEEDED FOR WHEEZING. 11/16/21  Yes Willo Mini, NP  amitriptyline  (ELAVIL ) 25 MG tablet TAKE 1 TABLET BY MOUTH EVERYDAY AT BEDTIME 11/19/23  Yes Geralene Kaiser, MD  BIOTIN PO Take by mouth.   Yes [provider]  EMGALITY 120 MG/ML SOAJ Inject 1 mL into the skin every 30 (thirty) days. 07/25/21  Yes [provider]  erythromycin   ophthalmic ointment Place 1 Application into the right eye 3 (three) times daily. Place a 1/4 inch ribbon of ointment into the lower eyelid. 11/23/23  Yes Maranda Jamee Jacob, MD  fexofenadine  (ALLEGRA ) 180 MG tablet Take 180 mg by mouth daily.   Yes [provider]  fluticasone  (FLONASE ) 50 MCG/ACT nasal spray SPRAY 2 SPRAYS INTO EACH NOSTRIL EVERY DAY 12/30/18  Yes Corey, Evan S, MD  fluticasone -salmeterol (WIXELA INHUB) 250-50 MCG/ACT AEPB Inhale 1 puff into the lungs in the morning and at bedtime. 07/07/23  Yes Meade Verdon RAMAN, MD  JUNEL  1/20 1-20 MG-MCG tablet TAKE 1 (ONE) TABLET DAILY CONTINUOUS ACTIVE PILLS 07/10/23  Yes Willo Mini, NP  LORazepam  (ATIVAN ) 0.5 MG tablet TAKE 1 TABLET BY MOUTH EVERY DAY AS NEEDED FOR ANXIETY 11/19/23  Yes Geralene Kaiser, MD  meclizine  (ANTIVERT ) 25 MG tablet Take 1 tablet (25 mg total) by mouth 3 (three) times daily as needed  for dizziness. 08/22/21  Yes Willo Mini, NP  Misc Natural Products (ELDERBERRY/VITAMIN C/ZINC PO) Take 1 tablet by mouth daily.   Yes [provider]  montelukast  (SINGULAIR ) 10 MG tablet Take 1 tablet (10 mg total) by mouth at bedtime. 12/31/22  Yes Willo Mini, NP  naproxen  sodium (ANAPROX  DS) 550 MG tablet Take 1 tablet (550 mg total) by mouth 2 (two) times daily with a meal. 10/25/23  Yes Maranda Jamee Jacob, MD  omega-3 acid ethyl esters (LOVAZA) 1 g capsule Take by mouth 2 (two) times daily.   Yes [provider]  predniSONE  (DELTASONE ) 50 MG tablet One tab PO daily for 5 days. 11/04/23  Yes Curtis Debby PARAS, MD  Spacer/Aero-Holding Raguel DEVI 2 puffs by Does not apply route in the morning and at bedtime. 01/23/23  Yes Desai, Nikita S, MD  triamcinolone  cream (KENALOG ) 0.1 % Apply 1 Application topically 2 (two) times daily. 12/31/22  Yes Jessup, Joy, NP  UBRELVY 100 MG TABS Take by mouth. 11/22/21  Yes [provider]  diclofenac  sodium (VOLTAREN ) 1 % GEL Apply 4 g topically 4 (four) times daily. To affected joint. 06/09/17   Joane Artist RAMAN, MD  pantoprazole  (PROTONIX ) 40 MG tablet TAKE 1 TABLET BY MOUTH EVERY DAY 10/31/23   Willo Mini, NP    Family History Family History  Problem Relation Age of Onset   Hypertension Mother    Cancer Mother        multiple myeloma   Diabetes Mother    Obesity Mother    Asthma Mother    Hypertension Father    Stroke Father    Cancer Maternal Grandmother        CLL   Hypertension Maternal Grandmother    Post-traumatic stress disorder Maternal Uncle    Crohn's disease Cousin        maternal grandfather sister daughter   Colon cancer Neg Hx    Esophageal cancer Neg Hx     Social History Social History   Tobacco Use   Smoking status: Never    Passive exposure: Past   Smokeless tobacco: Never  Vaping Use   Vaping status: Never Used  Substance Use Topics   Alcohol use: No   Drug use: No     Allergies   Catfish  [fish allergy], Fish-derived products, Banana, Citric acid , Monosodium glutamate, Pineapple, Promethazine  hcl, Stevia glycerite extract [flavoring agent (non-screening)], Tomato, Strawberry (diagnostic), and Amoxicillin    Review of Systems Review of Systems See HPI  Physical Exam Triage Vital Signs ED Triage Vitals  Encounter  Vitals Group     BP 11/23/23 0950 138/84     Girls Systolic BP Percentile --      Girls Diastolic BP Percentile --      Boys Systolic BP Percentile --      Boys Diastolic BP Percentile --      Pulse Rate 11/23/23 0950 99     Resp 11/23/23 0950 18     Temp 11/23/23 0950 99.8 F (37.7 C)     Temp Source 11/23/23 0950 Oral     SpO2 11/23/23 0950 99 %     Weight 11/23/23 0951 165 lb (74.8 kg)     Height 11/23/23 0951 5' 5 (1.651 m)     Head Circumference --      Peak Flow --      Pain Score 11/23/23 0951 3     Pain Loc --      Pain Education --      Exclude from Growth Chart --    No data found.  Updated Vital Signs BP 138/84 (BP Location: Right Arm)   Pulse 99   Temp 99.8 F (37.7 C) (Oral)   Resp 18   Ht 5' 5 (1.651 m)   Wt 74.8 kg   SpO2 99%   BMI 27.46 kg/m      Physical Exam Constitutional:      General: She is not in acute distress.    Appearance: She is well-developed.  HENT:     Head: Normocephalic and atraumatic.  Eyes:     General: Lids are normal. Lids are everted, no foreign bodies appreciated. Vision grossly intact. Gaze aligned appropriately.        Right eye: No foreign body, discharge or hordeolum.     Conjunctiva/sclera:     Right eye: Right conjunctiva is injected.     Pupils: Pupils are equal, round, and reactive to light.     Slit lamp exam:    Right eye: Anterior chamber quiet.      Comments: Fluorescein uptake superficial seen under lower lid laterally  Cardiovascular:     Rate and Rhythm: Normal rate.  Pulmonary:     Effort: Pulmonary effort is normal. No respiratory distress.  Abdominal:     General: There  is no distension.     Palpations: Abdomen is soft.  Musculoskeletal:        General: Normal range of motion.     Cervical back: Normal range of motion.  Skin:    General: Skin is warm and dry.  Neurological:     Mental Status: She is alert.      UC Treatments / Results  Labs (all labs ordered are listed, but only abnormal results are displayed) Labs Reviewed - No data to display  EKG   Radiology No results found.  Procedures Procedures (including critical care time)  Medications Ordered in UC Medications - No data to display  Initial Impression / Assessment and Plan / UC Course  I have reviewed the triage vital signs and the nursing notes.  Pertinent labs & imaging results that were available during my care of the patient were reviewed by me and considered in my medical decision making (see chart for details).    Discussed OTc Tylenol  or ibuprofen  for pain. May continue lubricant eyedrops At erythromycin  3 times a day Final Clinical Impressions(s) / UC Diagnoses   Final diagnoses:  Abrasion of right cornea, initial encounter  Eye irritation     Discharge Instructions  Use the eye ointment 3 times a day until the discomfort goes away See your eye doctor if not improving by next week   ED Prescriptions     Medication Sig Dispense Auth. Provider   erythromycin  ophthalmic ointment Place 1 Application into the right eye 3 (three) times daily. Place a 1/4 inch ribbon of ointment into the lower eyelid. 1 g Maranda Jamee Jacob, MD      PDMP not reviewed this encounter.   Maranda Jamee Jacob, MD 11/23/23 [phone]

## 2023-11-26 ENCOUNTER — Ambulatory Visit (INDEPENDENT_AMBULATORY_CARE_PROVIDER_SITE_OTHER): Admitting: Sports Medicine

## 2023-11-26 ENCOUNTER — Encounter: Payer: Self-pay | Admitting: Sports Medicine

## 2023-11-26 DIAGNOSIS — J0100 Acute maxillary sinusitis, unspecified: Secondary | ICD-10-CM

## 2023-11-26 NOTE — Assessment & Plan Note (Signed)
 This very pleasant 43 year old female returns, we saw her approximately 2 weeks ago she had gone to a wedding, she then had a scratchy throat that evolved into facial pain and pressure with radiation to the ears and upper teeth, also developed aching in her knees and swelling. She was seen twice in urgent care and was prescribed doxycycline , she also had an ESR that was drawn that was negative. She improved slightly but still had facial pain and pressure. Her exam was normal. We decided to treat her aggressively with 5 days of prednisone  and Augmentin , of note amoxicillin  was noted on the allergy list, the side effect was listed as gastrointestinal which is normal with penicillin-based antibiotics so we removed it from her allergy list. She has done extremely well, she is symptom-free, she had no adverse effects with Augmentin  and she can return to see me as needed.

## 2023-11-26 NOTE — Progress Notes (Signed)
    Procedures performed today:    None.  Independent interpretation of notes and tests performed by another provider:   None.  Brief History, Exam, Impression, and Recommendations:    Maxillary sinusitis This very pleasant 43 year old female returns, we saw her approximately 2 weeks ago she had gone to a wedding, she then had a scratchy throat that evolved into facial pain and pressure with radiation to the ears and upper teeth, also developed aching in her knees and swelling. She was seen twice in urgent care and was prescribed doxycycline , she also had an ESR that was drawn that was negative. She improved slightly but still had facial pain and pressure. Her exam was normal. We decided to treat her aggressively with 5 days of prednisone  and Augmentin , of note amoxicillin  was noted on the allergy list, the side effect was listed as gastrointestinal which is normal with penicillin-based antibiotics so we removed it from her allergy list. She has done extremely well, she is symptom-free, she had no adverse effects with Augmentin  and she can return to see me as needed.    ____________________________________________ Debby PARAS. Curtis, M.D., ABFM., CAQSM., AME. Primary Care and Sports Medicine [facility]  Adjunct Professor of [facility] Medicine  [employer]  Restaurant manager, fast food

## 2023-12-19 NOTE — Procedures (Signed)
Result scanned to media

## 2023-12-22 NOTE — Progress Notes (Unsigned)
 Virtual Visit via Video Note  I connected with [name] on 12/23/23 at  4:00 PM EDT by a video enabled telemedicine application and verified that I am speaking with the correct person using two identifiers.   I discussed the limitations of evaluation and management by telemedicine and the availability of in person appointments. The patient expressed understanding and agreed to proceed.  Patient location: home Provider locations: office  Subjective:    CC: rash  HPI: Kelsey [name] is a 43 year old female who presents with a persistent rash and spotting.  She has a persistent rash with bumps, occurring consistently with Band-Aid use, suggesting an adhesive allergy. Interested in topical options to help resolve this.  She experiences daily spotting that began several months ago, associated with missed doses of Junel , which she takes continuously to prevent migraines. Despite continuous use, the spotting persists. She denies changes in diet, exercise, or weight fluctuations.   Past medical history, Surgical history, Family history not pertinant except as noted below, Social history, Allergies, and medications have been entered into the medical record, reviewed, and corrections made.   Review of Systems: See HPI for pertinent positives and negatives.   Objective:    General: Speaking clearly in complete sentences without any shortness of breath.  Alert and oriented x3.  Normal judgment. No apparent acute distress.  Impression and Recommendations:    Allergic contact dermatitis due to adhesive Allergic contact dermatitis from adhesive exposure, presenting as a persistent rash at the site of Band-Aid application. No signs of infection. - Apply triamcinolone  cream sparingly to the affected area twice daily for up to 14 days.  Abnormal uterine bleeding on continuous oral contraceptives Persistent spotting for several months while on continuous Junel  oral contraceptive,  initiated after a disruption in the medication schedule. She experiences migraines when stopping the medication, complicating management. - Take a break from Junel  for 3 days to allow for a withdrawal bleed and reset the menstrual cycle. - If spotting persists after the break, consider switching to a contraceptive with higher estrogen content or using tranexamic acid (Lysteda) or a Provera challenge as alternative management options. - Monitor for any increase in bleeding severity and report back if the issue persists or worsens.  I discussed the assessment and treatment plan with the patient. The patient was provided an opportunity to ask questions and all were answered. The patient agreed with the plan and demonstrated an understanding of the instructions.   The patient was advised to call back or seek an in-person evaluation if the symptoms worsen or if the condition fails to improve as anticipated.  Return if symptoms worsen or fail to improve.  Kelsey FREDRIK Palin, DNP, APRN, FNP-BC [facility]

## 2023-12-23 ENCOUNTER — Telehealth (INDEPENDENT_AMBULATORY_CARE_PROVIDER_SITE_OTHER): Admitting: Medical-Surgical

## 2023-12-23 ENCOUNTER — Encounter: Payer: Self-pay | Admitting: Medical-Surgical

## 2023-12-23 DIAGNOSIS — N939 Abnormal uterine and vaginal bleeding, unspecified: Secondary | ICD-10-CM | POA: Diagnosis not present

## 2023-12-23 DIAGNOSIS — Z91048 Other nonmedicinal substance allergy status: Secondary | ICD-10-CM

## 2023-12-31 ENCOUNTER — Encounter: Payer: Self-pay | Admitting: Medical-Surgical

## 2023-12-31 DIAGNOSIS — Z30019 Encounter for initial prescription of contraceptives, unspecified: Secondary | ICD-10-CM

## 2024-01-01 ENCOUNTER — Encounter: Attending: Psychology | Admitting: Psychology

## 2024-01-01 ENCOUNTER — Encounter: Payer: Self-pay | Admitting: Psychology

## 2024-01-01 DIAGNOSIS — F419 Anxiety disorder, unspecified: Secondary | ICD-10-CM

## 2024-01-01 DIAGNOSIS — G4752 REM sleep behavior disorder: Secondary | ICD-10-CM | POA: Diagnosis present

## 2024-01-01 DIAGNOSIS — F0781 Postconcussional syndrome: Secondary | ICD-10-CM | POA: Diagnosis not present

## 2024-01-01 DIAGNOSIS — G3184 Mild cognitive impairment, so stated: Secondary | ICD-10-CM

## 2024-01-01 DIAGNOSIS — G43009 Migraine without aura, not intractable, without status migrainosus: Secondary | ICD-10-CM

## 2024-01-01 DIAGNOSIS — F32A Depression, unspecified: Secondary | ICD-10-CM | POA: Diagnosis present

## 2024-01-01 DIAGNOSIS — F4001 Agoraphobia with panic disorder: Secondary | ICD-10-CM

## 2024-01-01 DIAGNOSIS — R41840 Attention and concentration deficit: Secondary | ICD-10-CM | POA: Insufficient documentation
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
## 2024-01-13 MED ORDER — JUNEL 1/20 1-20 MG-MCG PO TABS: ORAL_TABLET | ORAL | 3 refills | Status: DC

## 2024-01-14 ENCOUNTER — Encounter: Payer: Self-pay | Admitting: Psychology

## 2024-01-14 DIAGNOSIS — G3184 Mild cognitive impairment, so stated: Secondary | ICD-10-CM

## 2024-01-14 DIAGNOSIS — F0781 Postconcussional syndrome: Secondary | ICD-10-CM | POA: Diagnosis not present

## 2024-01-14 DIAGNOSIS — G43009 Migraine without aura, not intractable, without status migrainosus: Secondary | ICD-10-CM | POA: Diagnosis not present

## 2024-01-14 DIAGNOSIS — F32A Depression, unspecified: Secondary | ICD-10-CM

## 2024-01-14 DIAGNOSIS — F4001 Agoraphobia with panic disorder: Secondary | ICD-10-CM

## 2024-01-14 DIAGNOSIS — R41840 Attention and concentration deficit: Secondary | ICD-10-CM

## 2024-01-14 DIAGNOSIS — F419 Anxiety disorder, unspecified: Secondary | ICD-10-CM

## 2024-01-14 DIAGNOSIS — G4752 REM sleep behavior disorder: Secondary | ICD-10-CM
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
## 2024-01-20 ENCOUNTER — Encounter: Payer: Self-pay | Admitting: Sports Medicine

## 2024-01-25 ENCOUNTER — Other Ambulatory Visit: Payer: Self-pay | Admitting: Medical-Surgical

## 2024-01-26 ENCOUNTER — Other Ambulatory Visit: Payer: Self-pay | Admitting: Medical-Surgical

## 2024-01-27 ENCOUNTER — Other Ambulatory Visit: Payer: Self-pay | Admitting: Internal Medicine

## 2024-01-27 NOTE — Telephone Encounter (Signed)
 Please advise if okay to send in symbicort ,prescriber says historical

## 2024-01-27 NOTE — Telephone Encounter (Signed)
 Refilled. Will need f/u appt in January for further refills.

## 2024-02-20 ENCOUNTER — Encounter: Payer: Self-pay | Admitting: Medical-Surgical

## 2024-02-20 ENCOUNTER — Ambulatory Visit: Payer: 59 | Admitting: Medical-Surgical

## 2024-02-20 VITALS — BP 99/68 | HR 96 | Resp 20 | Ht 65.0 in | Wt 169.0 lb

## 2024-02-20 DIAGNOSIS — E7800 Pure hypercholesterolemia, unspecified: Secondary | ICD-10-CM | POA: Diagnosis not present

## 2024-02-20 DIAGNOSIS — Z0000 Encounter for general adult medical examination without abnormal findings: Secondary | ICD-10-CM | POA: Diagnosis not present

## 2024-02-20 DIAGNOSIS — Z23 Encounter for immunization: Secondary | ICD-10-CM | POA: Diagnosis not present

## 2024-02-20 MED ORDER — MONTELUKAST SODIUM 10 MG PO TABS: 10.0000 mg | ORAL_TABLET | Freq: Every day | ORAL | 3 refills | Status: AC

## 2024-02-20 MED ORDER — PANTOPRAZOLE SODIUM 40 MG PO TBEC: 40.0000 mg | DELAYED_RELEASE_TABLET | Freq: Every day | ORAL | 3 refills | Status: AC

## 2024-02-20 MED ORDER — COVID-19 MRNA VAC-TRIS(PFIZER) 30 MCG/0.3ML IM SUSY: 0.3000 mL | PREFILLED_SYRINGE | Freq: Once | INTRAMUSCULAR | 0 refills | Status: AC

## 2024-02-20 NOTE — Progress Notes (Signed)
 Complete physical exam  Patient: Kelsey [name]   DOB: [birth year]   43 y.o. Female  MRN: [id number]  Subjective:    Chief Complaint  Patient presents with   Annual Exam    Kelsey [name] is a 43 y.o. female who presents today for a complete physical exam. She reports consuming a Nurtisystem diet. Regular exercise with swimming, resistance training, and walking She generally feels well. She reports sleeping well. She does not have additional problems to discuss today.    Most recent fall risk assessment:    02/20/2024   11:27 AM  Fall Risk   Falls in the past year? 0  Number falls in past yr: 0  Injury with Fall? 0  Risk for fall due to : No Fall Risks  Follow up Falls evaluation completed     Most recent depression screenings:    02/20/2024   11:27 AM 12/31/2022    9:46 AM  PHQ 2/9 Scores  PHQ - 2 Score 0 0  PHQ- 9 Score 0     Vision:Within last year and Dental: No current dental problems and Receives regular dental care    Patient Care Team: Willo Mini, NP as PCP - General (Nurse Practitioner) Kandyce Sor, MD as Consulting Physician (Obstetrics and Gynecology)   Outpatient Medications Prior to Visit  Medication Sig   albuterol  (VENTOLIN  HFA) 108 (90 Base) MCG/ACT inhaler INHALE 2 PUFFS INTO THE LUNGS EVERY 4 (FOUR) HOURS AS NEEDED FOR WHEEZING.   amitriptyline  (ELAVIL ) 25 MG tablet TAKE 1 TABLET BY MOUTH EVERYDAY AT BEDTIME   BIOTIN PO Take by mouth.   budesonide -formoterol  (SYMBICORT ) 160-4.5 MCG/ACT inhaler Inhale 2 puffs into the lungs 2 (two) times daily.   cetirizine (ZYRTEC) 10 MG chewable tablet Chew 10 mg by mouth daily.   diclofenac  sodium (VOLTAREN ) 1 % GEL Apply 4 g topically 4 (four) times daily. To affected joint.   EMGALITY 120 MG/ML SOAJ Inject 1 mL into the skin every 30 (thirty) days.   fluticasone  (FLONASE ) 50 MCG/ACT nasal spray SPRAY 2 SPRAYS INTO EACH NOSTRIL EVERY DAY   JUNEL  1/20 1-20 MG-MCG tablet Take 1 tablet day of continuous  active pills. Skip inactive pills in each pack.   LORazepam  (ATIVAN ) 0.5 MG tablet TAKE 1 TABLET BY MOUTH EVERY DAY AS NEEDED FOR ANXIETY   meclizine  (ANTIVERT ) 25 MG tablet Take 1 tablet (25 mg total) by mouth 3 (three) times daily as needed for dizziness.   Misc Natural Products (ELDERBERRY/VITAMIN C/ZINC PO) Take 1 tablet by mouth daily.   omega-3 acid ethyl esters (LOVAZA) 1 g capsule Take by mouth 2 (two) times daily.   Spacer/Aero-Holding Chambers DEVI 2 puffs by Does not apply route in the morning and at bedtime.   triamcinolone  cream (KENALOG ) 0.1 % Apply 1 Application topically 2 (two) times daily.   TYRVAYA 0.03 MG/ACT SOLN    UBRELVY 100 MG TABS Take by mouth.   [DISCONTINUED] fexofenadine  (ALLEGRA ) 180 MG tablet Take 180 mg by mouth daily.   [DISCONTINUED] montelukast  (SINGULAIR ) 10 MG tablet TAKE 1 TABLET BY MOUTH EVERYDAY AT BEDTIME   [DISCONTINUED] pantoprazole  (PROTONIX ) 40 MG tablet TAKE 1 TABLET BY MOUTH EVERY DAY   No facility-administered medications prior to visit.    Review of Systems  Constitutional:  Negative for chills, fever, malaise/fatigue and weight loss.  HENT:  Negative for congestion, ear pain, hearing loss, sinus pain and sore throat.   Eyes:  Negative for blurred vision, photophobia and pain.  Respiratory:  Negative  for cough, shortness of breath and wheezing.   Cardiovascular:  Negative for chest pain, palpitations and leg swelling.  Gastrointestinal:  Positive for heartburn. Negative for abdominal pain, constipation, diarrhea, nausea and vomiting.  Genitourinary:  Negative for dysuria, frequency and urgency.  Musculoskeletal:  Positive for joint pain and myalgias. Negative for falls and neck pain.  Skin:  Negative for itching and rash.  Neurological:  Positive for headaches. Negative for dizziness and weakness.  Endo/Heme/Allergies:  Negative for polydipsia. Does not bruise/bleed easily.  Psychiatric/Behavioral:  Negative for depression, substance abuse  and suicidal ideas. The patient is nervous/anxious. The patient does not have insomnia.      Objective:    BP 99/68 (BP Location: Left Arm, Cuff Size: Normal)   Pulse 96   Resp 20   Ht 5' 5 (1.651 m)   Wt 169 lb (76.7 kg)   SpO2 100%   BMI 28.12 kg/m    Physical Exam Vitals reviewed.  Constitutional:      General: She is not in acute distress.    Appearance: Normal appearance. She is not ill-appearing.  HENT:     Head: Normocephalic and atraumatic.     Right Ear: Tympanic membrane, ear canal and external ear normal. There is no impacted cerumen.     Left Ear: Tympanic membrane, ear canal and external ear normal. There is no impacted cerumen.     Nose: Nose normal. No congestion or rhinorrhea.     Mouth/Throat:     Mouth: Mucous membranes are moist.     Pharynx: No oropharyngeal exudate or posterior oropharyngeal erythema.  Eyes:     General: No scleral icterus.       Right eye: No discharge.        Left eye: No discharge.     Extraocular Movements: Extraocular movements intact.     Conjunctiva/sclera: Conjunctivae normal.     Pupils: Pupils are equal, round, and reactive to light.  Neck:     Thyroid: No thyromegaly.     Vascular: No carotid bruit or JVD.     Trachea: Trachea normal.  Cardiovascular:     Rate and Rhythm: Normal rate and regular rhythm.     Pulses: Normal pulses.     Heart sounds: Normal heart sounds. No murmur heard.    No friction rub. No gallop.  Pulmonary:     Effort: Pulmonary effort is normal. No respiratory distress.     Breath sounds: Normal breath sounds. No wheezing.  Abdominal:     General: Bowel sounds are normal. There is no distension.     Palpations: Abdomen is soft.     Tenderness: There is no abdominal tenderness. There is no guarding.  Musculoskeletal:        General: Normal range of motion.     Cervical back: Normal range of motion and neck supple.  Lymphadenopathy:     Cervical: No cervical adenopathy.  Skin:    General: Skin  is warm and dry.  Neurological:     Mental Status: She is alert and oriented to person, place, and time.     Cranial Nerves: No cranial nerve deficit.  Psychiatric:        Mood and Affect: Mood normal.        Behavior: Behavior normal.        Thought Content: Thought content normal.        Judgment: Judgment normal.   No results found for any visits on 02/20/24.  Assessment & Plan:    Routine Health Maintenance and Physical Exam  Immunization History  Administered Date(s) Administered   Hepatitis A 04/23/2006, 04/02/2007   Hepatitis B, ADULT 10/26/2010, 08/20/2011   IPV 04/02/2007   Influenza, Seasonal, Injecte, Preservative Fre 02/20/2024   Influenza,inj,Quad PF,6+ Mos 03/08/2014, 01/14/2019, 02/08/2020, 02/09/2021, 05/09/2022   Influenza-Unspecified 02/17/2017, 01/22/2023   MMR 03/03/1984, 07/20/1996   Meningococcal polysaccharide vaccine (MPSV4) 11/17/2000   Moderna Sars-Covid-2 Vaccination 09/10/2019, 10/08/2019   OPV 01/16/1988   PNEUMOCOCCAL CONJUGATE-20 02/20/2024   PPD Test 11/19/2000   Pfizer(Comirnaty)Fall Seasonal Vaccine 12 years and older 05/14/2022   Td 08/22/2003   Tdap 03/08/2014, 09/25/2019   Typhoid Parenteral 04/23/2006    Health Maintenance  Topic Date Due   HPV VACCINES (1 - 3-dose SCDM series) Never done   Hepatitis B Vaccines 19-59 Average Risk (3 of 3 - 19+ 3-dose series) 10/15/2011   COVID-19 Vaccine (4 - 2025-26 season) 01/19/2024   Mammogram  12/22/2024   Cervical Cancer Screening (HPV/Pap Cotest)  02/09/2026   DTaP/Tdap/Td (4 - Td or Tdap) 09/24/2029   Pneumococcal Vaccine  Completed   Influenza Vaccine  Completed   Hepatitis C Screening  Completed   HIV Screening  Completed   Meningococcal B Vaccine  Aged Out    Discussed health benefits of physical activity, and encouraged her to engage in regular exercise appropriate for her age and condition.  1. Annual physical exam (Primary) Checking labs as below. UTD on preventative care.  Wellness information provided with AVS. - CBC with Differential/Platelet - CMP14+EGFR  2. Elevated LDL cholesterol level Checking lipids. - Lipid panel  3. Immunization due Flu vaccine and Prevnar 20 given in office today.  Interested in getting the updated COVID-vaccine but not today.  She meets the criteria with a history of asthma.  Printed prescription provided so that she can get this at her convenience. - Flu vaccine trivalent PF, 6mos and older(Flulaval,Afluria,Fluarix,Fluzone) - Pneumococcal conjugate vaccine 20-valent (Prevnar 20)  Return in about 6 months (around 08/20/2024) for chronic disease follow up.   Kelden Lavallee, NP

## 2024-02-20 NOTE — Patient Instructions (Signed)
 Preventive Care 41-41 Years Old, Female  Preventive care refers to lifestyle choices and visits with your health care provider that can promote health and wellness. Preventive care visits are also called wellness exams.  What can I expect for my preventive care visit?  Counseling  Your health care provider may ask you questions about your:  Medical history, including:  Past medical problems.  Family medical history.  Pregnancy history.  Current health, including:  Menstrual cycle.  Method of birth control.  Emotional well-being.  Home life and relationship well-being.  Sexual activity and sexual health.  Lifestyle, including:  Alcohol, nicotine or tobacco, and drug use.  Access to firearms.  Diet, exercise, and sleep habits.  Work and work Astronomer.  Sunscreen use.  Safety issues such as seatbelt and bike helmet use.  Physical exam  Your health care provider will check your:  Height and weight. These may be used to calculate your BMI (body mass index). BMI is a measurement that tells if you are at a healthy weight.  Waist circumference. This measures the distance around your waistline. This measurement also tells if you are at a healthy weight and may help predict your risk of certain diseases, such as type 2 diabetes and high blood pressure.  Heart rate and blood pressure.  Body temperature.  Skin for abnormal spots.  What immunizations do I need?    Vaccines are usually given at various ages, according to a schedule. Your health care provider will recommend vaccines for you based on your age, medical history, and lifestyle or other factors, such as travel or where you work.  What tests do I need?  Screening  Your health care provider may recommend screening tests for certain conditions. This may include:  Lipid and cholesterol levels.  Diabetes screening. This is done by checking your blood sugar (glucose) after you have not eaten for a while (fasting).  Pelvic exam and Pap test.  Hepatitis B test.  Hepatitis C  test.  HIV (human immunodeficiency virus) test.  STI (sexually transmitted infection) testing, if you are at risk.  Lung cancer screening.  Colorectal cancer screening.  Mammogram. Talk with your health care provider about when you should start having regular mammograms. This may depend on whether you have a family history of breast cancer.  BRCA-related cancer screening. This may be done if you have a family history of breast, ovarian, tubal, or peritoneal cancers.  Bone density scan. This is done to screen for osteoporosis.  Talk with your health care provider about your test results, treatment options, and if necessary, the need for more tests.  Follow these instructions at home:  Eating and drinking    Eat a diet that includes fresh fruits and vegetables, whole grains, lean protein, and low-fat dairy products.  Take vitamin and mineral supplements as recommended by your health care provider.  Do not drink alcohol if:  Your health care provider tells you not to drink.  You are pregnant, may be pregnant, or are planning to become pregnant.  If you drink alcohol:  Limit how much you have to 0-1 drink a day.  Know how much alcohol is in your drink. In the [place] one drink equals one 12 oz bottle of beer (355 mL), one 5 oz glass of wine (148 mL), or one 1 oz glass of hard liquor (44 mL).  Lifestyle  Brush your teeth every morning and night with fluoride toothpaste. Floss one time each day.  Exercise for at least  30 minutes 5 or more days each week.  Do not use any products that contain nicotine or tobacco. These products include cigarettes, chewing tobacco, and vaping devices, such as e-cigarettes. If you need help quitting, ask your health care provider.  Do not use drugs.  If you are sexually active, practice safe sex. Use a condom or other form of protection to prevent STIs.  If you do not wish to become pregnant, use a form of birth control. If you plan to become pregnant, see your health care provider for a  prepregnancy visit.  Take aspirin only as told by your health care provider. Make sure that you understand how much to take and what form to take. Work with your health care provider to find out whether it is safe and beneficial for you to take aspirin daily.  Find healthy ways to manage stress, such as:  Meditation, yoga, or listening to music.  Journaling.  Talking to a trusted person.  Spending time with friends and family.  Minimize exposure to UV radiation to reduce your risk of skin cancer.  Safety  Always wear your seat belt while driving or riding in a vehicle.  Do not drive:  If you have been drinking alcohol. Do not ride with someone who has been drinking.  When you are tired or distracted.  While texting.  If you have been using any mind-altering substances or drugs.  Wear a helmet and other protective equipment during sports activities.  If you have firearms in your house, make sure you follow all gun safety procedures.  Seek help if you have been physically or sexually abused.  What's next?  Visit your health care provider once a year for an annual wellness visit.  Ask your health care provider how often you should have your eyes and teeth checked.  Stay up to date on all vaccines.  This information is not intended to replace advice given to you by your health care provider. Make sure you discuss any questions you have with your health care provider.  Document Revised: 11/01/2020 Document Reviewed: 11/01/2020  Elsevier Patient Education  2024 [employer].

## 2024-02-21 LAB — CMP14+EGFR
ALT: 13 IU/L (ref 0–32)
AST: 16 IU/L (ref 0–40)
Albumin: 4.2 g/dL (ref 3.9–4.9)
Alkaline Phosphatase: 56 IU/L (ref 41–116)
BUN/Creatinine Ratio: 11 (ref 9–23)
BUN: 11 mg/dL (ref 6–24)
Bilirubin Total: 0.5 mg/dL (ref 0.0–1.2)
CO2: 21 mmol/L (ref 20–29)
Calcium: 9.2 mg/dL (ref 8.7–10.2)
Chloride: 102 mmol/L (ref 96–106)
Creatinine, Ser: 1.03 mg/dL — ABNORMAL HIGH (ref 0.57–1.00)
Globulin, Total: 2.5 g/dL (ref 1.5–4.5)
Glucose: 83 mg/dL (ref 70–99)
Potassium: 4.5 mmol/L (ref 3.5–5.2)
Sodium: 136 mmol/L (ref 134–144)
Total Protein: 6.7 g/dL (ref 6.0–8.5)
eGFR: 70 mL/min/1.73 (ref >59)

## 2024-02-21 LAB — CBC WITH DIFFERENTIAL/PLATELET
Basophils Absolute: 0 x10E3/uL (ref 0.0–0.2)
Basos: 1 %
EOS (ABSOLUTE): 0.1 x10E3/uL (ref 0.0–0.4)
Eos: 3 %
Hematocrit: 43.6 % (ref 34.0–46.6)
Hemoglobin: 14.5 g/dL (ref 11.1–15.9)
Immature Grans (Abs): 0 x10E3/uL (ref 0.0–0.1)
Immature Granulocytes: 0 %
Lymphocytes Absolute: 1.5 x10E3/uL (ref 0.7–3.1)
Lymphs: 35 %
MCH: 29.5 pg (ref 26.6–33.0)
MCHC: 33.3 g/dL (ref 31.5–35.7)
MCV: 89 fL (ref 79–97)
Monocytes Absolute: 0.2 x10E3/uL (ref 0.1–0.9)
Monocytes: 6 %
Neutrophils Absolute: 2.3 x10E3/uL (ref 1.4–7.0)
Neutrophils: 55 %
Platelets: 306 x10E3/uL (ref 150–450)
RBC: 4.91 x10E6/uL (ref 3.77–5.28)
RDW: 12.7 % (ref 11.7–15.4)
WBC: 4.2 x10E3/uL (ref 3.4–10.8)

## 2024-02-21 LAB — LIPID PANEL
Chol/HDL Ratio: 4.2 ratio (ref 0.0–4.4)
Cholesterol, Total: 161 mg/dL (ref 100–199)
HDL: 38 mg/dL — ABNORMAL LOW (ref >39)
LDL Chol Calc (NIH): 107 mg/dL — ABNORMAL HIGH (ref 0–99)
Triglycerides: 85 mg/dL (ref 0–149)
VLDL Cholesterol Cal: 16 mg/dL (ref 5–40)

## 2024-02-23 ENCOUNTER — Ambulatory Visit: Payer: Self-pay | Admitting: Medical-Surgical

## 2024-02-25 ENCOUNTER — Encounter: Payer: Self-pay | Admitting: Medical-Surgical

## 2024-03-02 ENCOUNTER — Ambulatory Visit: Payer: 59 | Admitting: Psychology

## 2024-03-04 ENCOUNTER — Ambulatory Visit: Payer: 59 | Admitting: Psychology

## 2024-03-05 ENCOUNTER — Other Ambulatory Visit (HOSPITAL_COMMUNITY): Payer: Self-pay | Admitting: Psychiatry

## 2024-03-20 ENCOUNTER — Other Ambulatory Visit: Payer: Self-pay | Admitting: Medical-Surgical

## 2024-03-21 ENCOUNTER — Other Ambulatory Visit (HOSPITAL_COMMUNITY): Payer: Self-pay | Admitting: Psychiatry

## 2024-05-31 ENCOUNTER — Encounter (HOSPITAL_COMMUNITY): Payer: Self-pay | Admitting: Psychiatry

## 2024-05-31 ENCOUNTER — Telehealth (INDEPENDENT_AMBULATORY_CARE_PROVIDER_SITE_OTHER): Admitting: Psychiatry

## 2024-05-31 DIAGNOSIS — F419 Anxiety disorder, unspecified: Secondary | ICD-10-CM | POA: Diagnosis not present

## 2024-05-31 DIAGNOSIS — F410 Panic disorder [episodic paroxysmal anxiety] without agoraphobia: Secondary | ICD-10-CM | POA: Diagnosis not present

## 2024-05-31 DIAGNOSIS — F5102 Adjustment insomnia: Secondary | ICD-10-CM

## 2024-05-31 DIAGNOSIS — G4700 Insomnia, unspecified: Secondary | ICD-10-CM

## 2024-05-31 MED ORDER — AMITRIPTYLINE HCL 25 MG PO TABS: ORAL_TABLET | ORAL | 1 refills | Status: AC

## 2024-05-31 MED ORDER — LORAZEPAM 0.5 MG PO TABS: ORAL_TABLET | ORAL | 0 refills | Status: AC

## 2024-05-31 NOTE — Progress Notes (Signed)
 " [facility] Follow up visit  Patient Identification: Kelsey [name] MRN:  [id number] Date of Evaluation:  05/31/2024 Referral Source: GI  Chief Complaint:  follow up sleep, anxiety Visit Diagnosis:    ICD-10-CM   1. Anxiety disorder, unspecified type  F41.9     2. Panic attacks  F41.0     3. Adjustment insomnia  F51.02     Virtual Visit via Video Note  I connected with Kelsey [name] on 05/31/2024 at  9:00 AM EST by a video enabled telemedicine application and verified that I am speaking with the correct person using two identifiers.  Location: Patient: home Provider: home office   I discussed the limitations of evaluation and management by telemedicine and the availability of in person appointments. The patient expressed understanding and agreed to proceed.    I discussed the assessment and treatment plan with the patient. The patient was provided an opportunity to ask questions and all were answered. The patient agreed with the plan and demonstrated an understanding of the instructions.   The patient was advised to call back or seek an in-person evaluation if the symptoms worsen or if the condition fails to improve as anticipated.  I provided 15 minutes of non-face-to-face time during this encounter.    History of Present Illness: Patient is a 44 years old currently married African-American female referred by GI doctor for establishment of care for possible anxiety.  Patient has been suffering from irritable bowel syndrome She also has a head injury in the has migraines and had been on disability since 2015   On evaluation patient has been doing better she has applied for education regimen and also had an interview related to that went well she took Ativan  the night before that took care of the anxiety and sleep she still takes imipramine at night for sleep denies having significant stressors is now looking forward to helping her son who was diagnosed with autism with home  schooling  Imipramine is also for migraines   Aggravating  factor: presentations,  past head injury, relationship can be, long covid Modifying factor: Family Severity  manageable  Duration more so last few years   Past Psychiatric History: anxiety  Consequences of Substance Abuse: NA  Past Medical History:  Past Medical History:  Diagnosis Date   Allergy    Anxiety and depression    Asthma    Brain injury (HCC) 2014   Clotting disorder 02/17/2022   Depression    Endometriosis    Frequent headaches    GERD (gastroesophageal reflux disease)    Hx of varicella    IBS (irritable bowel syndrome)    Migraines    Panic attacks    Postpartum care following cesarean delivery (2/18) 07/08/2015   Seizure (HCC)    Vertigo 12/31/2013    Past Surgical History:  Procedure Laterality Date   CESAREAN SECTION N/A 07/08/2015   Procedure: CESAREAN SECTION;  Surgeon: Burnard Bowers, MD;  Location: [facility];  Service: Obstetrics;  Laterality: N/A;   ESOPHAGOGASTRODUODENOSCOPY     [facility] 2009-2010   NO PAST SURGERIES      Family Psychiatric History: Mom: OCD possibel  Family History:  Family History  Problem Relation Age of Onset   Hypertension Mother    Cancer Mother        multiple myeloma   Diabetes Mother    Obesity Mother    Asthma Mother    Early death Mother    Hypertension Father  Stroke Father    Cancer Maternal Grandmother        CLL   Hypertension Maternal Grandmother    Post-traumatic stress disorder Maternal Uncle    Crohn's disease Cousin        maternal grandfather sister daughter   Colon cancer Neg Hx    Esophageal cancer Neg Hx     Social History:   Social History   Socioeconomic History   Marital status: Married    Spouse name: Not on file   Number of children: 0   Years of education: Not on file   Highest education level: Master's degree (e.g., MA, MS, MEng, MEd, MSW, MBA)  Occupational History   Not on file  Tobacco Use    Smoking status: Never    Passive exposure: Past   Smokeless tobacco: Never  Vaping Use   Vaping status: Never Used  Substance and Sexual Activity   Alcohol use: No   Drug use: No   Sexual activity: Yes    Partners: Male    Birth control/protection: Pill  Other Topics Concern   Not on file  Social History Narrative   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      Home Situation: lives with husband      Spiritual Beliefs: Christian      Lifestyle: doing 20 minutes of exercise and a little weight lifting daily; diet is healthy            Social Drivers of Health   Tobacco Use: Low Risk (05/31/2024)   Patient History    Smoking Tobacco Use: Never    Smokeless Tobacco Use: Never    Passive Exposure: Past  Financial Resource Strain: Low Risk (04/03/2023)   Overall Financial Resource Strain (CARDIA)    Difficulty of Paying Living Expenses: Not hard at all  Food Insecurity: No Food Insecurity (04/03/2023)   Hunger Vital Sign    Worried About Running Out of Food in the Last Year: Never true    Ran Out of Food in the Last Year: Never true  Transportation Needs: No Transportation Needs (04/03/2023)   PRAPARE - Administrator, Civil Service (Medical): No    Lack of Transportation (Non-Medical): No  Physical Activity: Insufficiently Active (04/03/2023)   Exercise Vital Sign    Days of Exercise per Week: 1 day    Minutes of Exercise per Session: 10 min  Stress: No Stress Concern Present (04/03/2023)   [employer] of Occupational Health - Occupational Stress Questionnaire    Feeling of Stress : Only a little  Social Connections: Moderately Integrated (04/03/2023)   Social Connection and Isolation Panel    Frequency of Communication with Friends and Family: Once a week    Frequency of Social Gatherings with Friends and Family: Never    Attends Religious Services: More than 4 times per year    Active Member of Clubs or Organizations: Yes    Attends Tax Inspector Meetings: More than 4 times per year    Marital Status: Married  Depression (PHQ2-9): Low Risk (02/20/2024)   Depression (PHQ2-9)    PHQ-2 Score: 0  Alcohol Screen: Not on file  Housing: Medium Risk (04/03/2023)   Housing    Last Housing Risk Score: 1  Utilities: Not At Risk (07/18/2022)   Received from [facility] Utilities    Threatened with loss of utilities: No  Health Literacy: Not on file     Allergies:   Allergies  Allergen Reactions   Catfish [Fish Allergy] Shortness Of Breath   Fish Protein-Containing Drug Products Shortness Of Breath   Banana Hives   Citric Acid  Hives   Monosodium Glutamate Other (See Comments)    Headache    Pineapple Hives   Promethazine  Hcl Other (See Comments)    Restless motion   Stevia Glycerite Extract [Flavoring Agent (Non-Screening)] Other (See Comments)    Mouth sores Headache   Tomato Other (See Comments)   Strawberry (Diagnostic) Other (See Comments)    Metabolic Disorder Labs: Lab Results  Component Value Date   HGBA1C 5.2 03/08/2014   No results found for: PROLACTIN Lab Results  Component Value Date   CHOL 161 02/20/2024   TRIG 85 02/20/2024   HDL 38 (L) 02/20/2024   CHOLHDL 4.2 02/20/2024   VLDL 88.5 03/08/2014   LDLCALC 107 (H) 02/20/2024   LDLCALC 94 02/19/2023   Lab Results  Component Value Date   TSH 0.72 07/22/2022    Therapeutic Level Labs: No results found for: LITHIUM No results found for: CBMZ No results found for: VALPROATE  Current Medications: Current Outpatient Medications  Medication Sig Dispense Refill   albuterol  (VENTOLIN  HFA) 108 (90 Base) MCG/ACT inhaler INHALE 2 PUFFS INTO THE LUNGS EVERY 4 (FOUR) HOURS AS NEEDED FOR WHEEZING. 8 g 1   amitriptyline  (ELAVIL ) 25 MG tablet TAKE 1 TABLET BY MOUTH EVERYDAY AT BEDTIME 30 tablet 1   BIOTIN PO Take by mouth.     budesonide -formoterol  (SYMBICORT ) 160-4.5 MCG/ACT inhaler Inhale 2 puffs into the lungs 2 (two) times daily.  10.2 each 3   cetirizine (ZYRTEC) 10 MG chewable tablet Chew 10 mg by mouth daily.     diclofenac  sodium (VOLTAREN ) 1 % GEL Apply 4 g topically 4 (four) times daily. To affected joint. 100 g 11   EMGALITY 120 MG/ML SOAJ Inject 1 mL into the skin every 30 (thirty) days.     fluticasone  (FLONASE ) 50 MCG/ACT nasal spray SPRAY 2 SPRAYS INTO EACH NOSTRIL EVERY DAY 16 mL 12   JUNEL  1/20 1-20 MG-MCG tablet Take 1 tablet day of continuous active pills. Skip inactive pills in each pack. 112 tablet 3   LORazepam  (ATIVAN ) 0.5 MG tablet TAKE 1 TABLET BY MOUTH EVERY DAY AS NEEDED FOR ANXIETY 30 tablet 0   meclizine  (ANTIVERT ) 25 MG tablet Take 1 tablet (25 mg total) by mouth 3 (three) times daily as needed for dizziness. 30 tablet 2   Misc Natural Products (ELDERBERRY/VITAMIN C/ZINC PO) Take 1 tablet by mouth daily.     montelukast  (SINGULAIR ) 10 MG tablet Take 1 tablet (10 mg total) by mouth at bedtime. TAKE 1 TABLET BY MOUTH EVERYDAY AT BEDTIME 90 tablet 3   omega-3 acid ethyl esters (LOVAZA) 1 g capsule Take by mouth 2 (two) times daily.     pantoprazole  (PROTONIX ) 40 MG tablet Take 1 tablet (40 mg total) by mouth daily. 90 tablet 3   Spacer/Aero-Holding Chambers DEVI 2 puffs by Does not apply route in the morning and at bedtime. 1 each 11   triamcinolone  cream (KENALOG ) 0.1 % Apply 1 Application topically 2 (two) times daily. 30 g 0   TYRVAYA 0.03 MG/ACT SOLN      UBRELVY 100 MG TABS Take by mouth.     No current facility-administered medications for this visit.     Psychiatric Specialty Exam: Review of Systems  Cardiovascular:  Negative for chest pain.  Neurological:  Negative for tremors.  Psychiatric/Behavioral:  Negative for self-injury.  There were no vitals taken for this visit.There is no height or weight on file to calculate BMI.  General Appearance: casual  Eye Contact:  fair  Speech:  Normal Rate  Volume:  Normal  Mood: fair  Affect:  Full Range  Thought Process:  Goal Directed   Orientation:  Full (Time, Place, and Person)  Thought Content:  Rumination  Suicidal Thoughts:  No  Homicidal Thoughts:  No  Memory:  Immediate;   Fair Recent;   Fair  Judgement:  Fair  Insight:  Fair  Psychomotor Activity:  Normal  Concentration:  Concentration: Good and Attention Span: Good  Recall:  Good  Fund of Knowledge:Good  Language: Good  Akathisia:  No  Handed:    AIMS (if indicated):  not done  Assets:  Communication Skills Desire for Improvement Financial Resources/Insurance Housing  ADL's:  Intact  Cognition: WNL  Sleep:  Fair   Screenings: GAD-7    Flowsheet Row Office Visit from 02/20/2024 in [facility] at [facility] Office Visit from 05/09/2022 in [facility] Medicine at [facility] Office Visit from 08/16/2021 in [facility] Care & [facility] Medicine at [facility] Office Visit from 11/19/2017 in [facility] & Sports Medicine at [facility]  Total GAD-7 Score 0 0 0 3   PHQ2-9    Flowsheet Row Office Visit from 02/20/2024 in [facility] Care & Sports Medicine at [facility] Office Visit from 12/31/2022 in [facility] Primary Care & Sports Medicine at [facility] Office Visit from 09/30/2022 in [facility] Primary Care & Sports Medicine at [facility] Office Visit from 07/22/2022 in [facility] at [facility] Office Visit from 05/09/2022 in [facility] Primary Care & Sports Medicine at [facility] Total Score 0 0 1 0 2  PHQ-9 Total Score 0 -- -- -- 4   Flowsheet Row UC from 11/23/2023 in [facility] [place] UC from 10/25/2023 in [facility] at [place] UC from 10/17/2023 in [facility] at [facility] RISK CATEGORY No Risk No Risk No Risk    Assessment and Plan: as follows Prior documentation  reviewed    Panic attacks; sporadic and manageable breathing exercises and Ativan  as per needed basis anxiety unspecified: fair continue ativan  prn Insomnia: Reviewed sleep hygiene continue imipramine at night  Medication renewed questions addressed follow-up in 6 months or earlier if needed   Jackey Flight, MD 1/12/20269:07 AM "

## 2024-06-04 ENCOUNTER — Ambulatory Visit
Admission: RE | Admit: 2024-06-04 | Discharge: 2024-06-04 | Disposition: A | Discharge status: Home / Self Care | Attending: Family Medicine | Admitting: Family Medicine

## 2024-06-04 ENCOUNTER — Other Ambulatory Visit: Payer: Self-pay

## 2024-06-04 VITALS — BP 132/82 | HR 82 | Temp 98.1°F | Resp 16

## 2024-06-04 DIAGNOSIS — H6992 Unspecified Eustachian tube disorder, left ear: Secondary | ICD-10-CM | POA: Diagnosis not present

## 2024-06-04 DIAGNOSIS — J069 Acute upper respiratory infection, unspecified: Secondary | ICD-10-CM | POA: Diagnosis not present

## 2024-06-04 MED ORDER — AMOXICILLIN 875 MG PO TABS: 875.0000 mg | ORAL_TABLET | Freq: Two times a day (BID) | ORAL | 0 refills | Status: DC

## 2024-06-04 MED ORDER — PREDNISONE 20 MG PO TABS: ORAL_TABLET | ORAL | 0 refills | Status: AC

## 2024-06-04 NOTE — Discharge Instructions (Addendum)
 Take plain guaifenesin  (1200mg  extended release tabs such as Mucinex ) twice daily, with plenty of water, for cough and congestion.  Get adequate rest.   May use Afrin nasal spray (or generic oxymetazoline) each morning for about 5 days and then discontinue.  Also recommend using saline nasal spray several times daily and saline nasal irrigation (AYR is a common brand).  Use Flonase  nasal spray each morning after using Afrin nasal spray and saline nasal irrigation. Try warm salt water gargles for sore throat.  Stop all antihistamines (Zyrtec, etc) for now, and other non-prescription cough/cold preparations. May take Delsym Cough Suppressant (12 Hour Cough Relief) at bedtime for nighttime cough.  Continue albuterol  and Symbicort  inhalers as prescribed Begin Amoxicillin  if not improving about one week or if persistent fever develops

## 2024-06-04 NOTE — ED Triage Notes (Signed)
 Pt c/o post nasal drip, sneezing, dry cough, fatigue, bodyaches, ears feel fullx2d. Pt c/o dizziness started yesterday upon waking and nausea.

## 2024-06-04 NOTE — ED Provider Notes (Signed)
 " [name]    CSN: [id number] Arrival date & time: 06/04/24  1527      History   Chief Complaint Chief Complaint  Patient presents with   Dizziness    Post Nasal drainage since Wednesday. Ears are full. - Entered by patient    HPI Kelsey [name] is a 44 y.o. female.   Three days ago patient developed increased nasal drainage, sneezing, headache, fatigue, myalgias, and a dry cough.  Yesterday she developed mild dizziness and nausea, a sensation of fullness in her ears, and inability to equalize pressure in her left ear.  She denies fever.  She has a history of seasonal rhinitis and mild asthma.  She had pneumonia about two years ago.  The history is provided by the patient.    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   Arthralgia 06/09/2017   Migraine without aura and without status migrainosus, not intractable 01/24/2016   Seasonal allergic rhinitis due to pollen 01/24/2016   Asthma, mild persistent 03/08/2014   GERD (gastroesophageal reflux disease) 03/08/2014   Seasonal allergies 03/08/2014   Irritable bowel syndrome with diarrhea 03/08/2014   Anxiety and depression 03/08/2014   Chronic daily headache 12/31/2013   Postconcussive syndrome 12/31/2013    Past Surgical History:  Procedure Laterality Date   CESAREAN SECTION N/A 07/08/2015   Procedure: CESAREAN SECTION;  Surgeon: Burnard Bowers, MD;  Location: [facility];  Service: Obstetrics;   Laterality: N/A;   ESOPHAGOGASTRODUODENOSCOPY     [facility] 2009-2010   NO PAST SURGERIES      OB History     Gravida  1   Para  1   Term  1   Preterm      AB      Living  1      SAB      IAB      Ectopic      Multiple  0   Live Births  1            Home Medications    Prior to Admission medications  Medication Sig Start Date End Date Taking? Authorizing Provider  amoxicillin  (AMOXIL ) 875 MG tablet Take 1 tablet (875 mg total) by mouth 2 (two) times daily. 06/04/24  Yes Pauline Garnette LABOR, MD  predniSONE  (DELTASONE ) 20 MG tablet Take one tab by mouth twice daily for 4 days, then one daily. Take with food. 06/04/24  Yes Pauline Garnette LABOR, MD  albuterol  (VENTOLIN  HFA) 108 (90 Base) MCG/ACT inhaler INHALE 2 PUFFS INTO THE LUNGS EVERY 4 (FOUR) HOURS AS NEEDED FOR WHEEZING. 11/16/21   Willo Mini, NP  amitriptyline  (ELAVIL ) 25 MG tablet TAKE 1 TABLET BY MOUTH EVERYDAY AT BEDTIME 05/31/24   Geralene Kaiser, MD  BIOTIN PO Take by mouth.    [provider]  budesonide -formoterol  (SYMBICORT ) 160-4.5 MCG/ACT inhaler Inhale  2 puffs into the lungs 2 (two) times daily. 01/27/24   Desai, Nikita S, MD  cetirizine (ZYRTEC) 10 MG chewable tablet Chew 10 mg by mouth daily.    [provider]  diclofenac  sodium (VOLTAREN ) 1 % GEL Apply 4 g topically 4 (four) times daily. To affected joint. 06/09/17   Joane Artist RAMAN, MD  EMGALITY 120 MG/ML SOAJ Inject 1 mL into the skin every 30 (thirty) days. 07/25/21   [provider]  fluticasone  (FLONASE ) 50 MCG/ACT nasal spray SPRAY 2 SPRAYS INTO EACH NOSTRIL EVERY DAY 12/30/18   Corey, Evan S, MD  LORazepam  (ATIVAN ) 0.5 MG tablet TAKE 1 TABLET BY MOUTH EVERY DAY AS NEEDED FOR ANXIETY 05/31/24   Geralene Kaiser, MD  meclizine  (ANTIVERT ) 25 MG tablet Take 1 tablet (25 mg total) by mouth 3 (three) times daily as needed for dizziness. 08/22/21   Willo Mini, NP  Misc Natural Products (ELDERBERRY/VITAMIN C/ZINC PO) Take 1 tablet  by mouth daily.    [provider]  montelukast  (SINGULAIR ) 10 MG tablet Take 1 tablet (10 mg total) by mouth at bedtime. TAKE 1 TABLET BY MOUTH EVERYDAY AT BEDTIME 02/20/24   Willo Mini, NP  omega-3 acid ethyl esters (LOVAZA) 1 g capsule Take by mouth 2 (two) times daily.    [provider]  pantoprazole  (PROTONIX ) 40 MG tablet Take 1 tablet (40 mg total) by mouth daily. 02/20/24   Willo Mini, NP  Spacer/Aero-Holding Raguel DEVI 2 puffs by Does not apply route in the morning and at bedtime. 01/23/23   Desai, Nikita S, MD  triamcinolone  cream (KENALOG ) 0.1 % Apply 1 Application topically 2 (two) times daily. 12/31/22   Willo Mini, NP  UBRELVY 100 MG TABS Take by mouth. 11/22/21   [provider]    Family History Family History  Problem Relation Age of Onset   Hypertension Mother    Cancer Mother        multiple myeloma   Diabetes Mother    Obesity Mother    Asthma Mother    Early death Mother    Hypertension Father    Stroke Father    Cancer Maternal Grandmother        CLL   Hypertension Maternal Grandmother    Post-traumatic stress disorder Maternal Uncle    Crohn's disease Cousin        maternal grandfather sister daughter   Colon cancer Neg Hx    Esophageal cancer Neg Hx     Social History Social History[1]   Allergies   Catfish [fish allergy], Fish protein-containing drug products, Banana, Citric acid , Monosodium glutamate, Pineapple, Promethazine  hcl, Stevia glycerite extract [flavoring agent (non-screening)], Tomato, and Strawberry (diagnostic)   Review of Systems Review of Systems ? sore throat + cough No pleuritic pain No wheezing + nasal congestion + post-nasal drainage No sinus pain/pressure No itchy/red eyes ? earache No hemoptysis No SOB No fever + nausea + dizziness No vomiting No abdominal pain No diarrhea No urinary symptoms No skin rash + fatigue + myalgias + headache Used OTC meds (Tylenol , Zyrtec) without  relief   Physical Exam Triage Vital Signs ED Triage Vitals  Encounter Vitals Group     BP 06/04/24 1541 132/82     Girls Systolic BP Percentile --      Girls Diastolic BP Percentile --      Boys Systolic BP Percentile --      Boys Diastolic BP Percentile --      Pulse Rate 06/04/24 1541  82     Resp 06/04/24 1541 16     Temp 06/04/24 1541 98.1 F (36.7 C)     Temp src --      SpO2 06/04/24 1541 99 %     Weight --      Height --      Head Circumference --      Peak Flow --      Pain Score 06/04/24 1536 3     Pain Loc --      Pain Education --      Exclude from Growth Chart --    No data found.  Updated Vital Signs BP 132/82   Pulse 82   Temp 98.1 F (36.7 C)   Resp 16   SpO2 99%   Visual Acuity Right Eye Distance:   Left Eye Distance:   Bilateral Distance:    Right Eye Near:   Left Eye Near:    Bilateral Near:     Physical Exam Nursing notes and Vital Signs reviewed. Appearance:  Patient appears stated age, and in no acute distress Eyes:  Pupils are equal, round, and reactive to light and accomodation.  Extraocular movement is intact.  Conjunctivae are not inflamed  Ears:  Canals normal.  Tympanic membranes normal.  Nose:  Congested turbinates.  Maxillary sinus tenderness is present.  Pharynx:  Normal Neck:  Supple.  Mildly enlarged lateral nodes are present, tender to palpation on the left.   Lungs:  Clear to auscultation.  Breath sounds are equal.  Moving air well. Heart:  Regular rate and rhythm without murmurs, rubs, or gallops.  Abdomen:  Nontender without masses or hepatosplenomegaly.  Bowel sounds are present.  No CVA or flank tenderness.  Extremities:  No edema.  Skin:  No rash present.   UC Treatments / Results  Labs (all labs ordered are listed, but only abnormal results are displayed) Labs Reviewed - No data to display  EKG   Radiology No results found.  Procedures Procedures (including critical care time)  Medications Ordered in  UC Medications - No data to display  Initial Impression / Assessment and Plan / UC Course  I have reviewed the triage vital signs and the nursing notes.  Pertinent labs & imaging results that were available during my care of the patient were reviewed by me and considered in my medical decision making (see chart for details).    History of mild reactive airways disease.  Begin prednisone  burst/taper.  Patient has history of pneumonia; if not improving about one week may begin amoxicillin  (Given a prescription to hold, with an expiration date). Followup with Family Doctor if not improved in about 10 days.  Final Clinical Impressions(s) / UC Diagnoses   Final diagnoses:  Viral URI with cough  Dysfunction of left eustachian tube     Discharge Instructions      Take plain guaifenesin  (1200mg  extended release tabs such as Mucinex ) twice daily, with plenty of water, for cough and congestion.  Get adequate rest.   May use Afrin nasal spray (or generic oxymetazoline) each morning for about 5 days and then discontinue.  Also recommend using saline nasal spray several times daily and saline nasal irrigation (AYR is a common brand).  Use Flonase  nasal spray each morning after using Afrin nasal spray and saline nasal irrigation. Try warm salt water gargles for sore throat.  Stop all antihistamines (Zyrtec, etc) for now, and other non-prescription cough/cold preparations. May take Delsym Cough Suppressant (12 Hour Cough Relief)  at bedtime for nighttime cough.  Continue albuterol  and Symbicort  inhalers as prescribed Begin Amoxicillin  if not improving about one week or if persistent fever develops       ED Prescriptions     Medication Sig Dispense Auth. Provider   predniSONE  (DELTASONE ) 20 MG tablet Take one tab by mouth twice daily for 4 days, then one daily. Take with food. 12 tablet Pauline Garnette LABOR, MD   amoxicillin  (AMOXIL ) 875 MG tablet Take 1 tablet (875 mg total) by mouth 2 (two)  times daily. 20 tablet Pauline Garnette LABOR, MD           [1]  Social History Tobacco Use   Smoking status: Never    Passive exposure: Past   Smokeless tobacco: Never  Vaping Use   Vaping status: Never Used  Substance Use Topics   Alcohol use: No   Drug use: No     Pauline Garnette LABOR, MD 06/05/24 1742  "

## 2024-06-10 ENCOUNTER — Ambulatory Visit (INDEPENDENT_AMBULATORY_CARE_PROVIDER_SITE_OTHER): Admitting: Medical-Surgical

## 2024-06-10 VITALS — BP 118/74 | HR 86 | Temp 98.8°F | Resp 16 | Ht 65.0 in | Wt 161.1 lb

## 2024-06-10 DIAGNOSIS — J019 Acute sinusitis, unspecified: Secondary | ICD-10-CM

## 2024-06-10 DIAGNOSIS — B9689 Other specified bacterial agents as the cause of diseases classified elsewhere: Secondary | ICD-10-CM

## 2024-06-10 MED ORDER — AMOXICILLIN-POT CLAVULANATE 875-125 MG PO TABS: 1.0000 | ORAL_TABLET | Freq: Two times a day (BID) | ORAL | 0 refills | Status: AC

## 2024-06-10 NOTE — Progress Notes (Signed)
 "       Established patient visit   History of Present Illness   Discussed the use of AI scribe software for clinical note transcription with the patient, who gave verbal consent to proceed.  History of Present Illness   Kelsey [name] is a 44 year old female who presents with vision changes and symptoms related to a viral upper respiratory infection.  Upper respiratory symptoms - Sinus congestion, ear fullness, chest congestion, and dizziness for about one week - No improvement in ear fullness; unable to pop ears - No chest pain - Nocturnal sensation of 'breathing through a straw' due to nasal congestion - Started on Mucinex  without benefit - Started amoxicillin  on Sunday, taking once daily instead of prescribed twice daily - Started prednisone , initially twice daily, decreased to once daily due to insomnia - Regular use of Flonase    Physical Exam   Physical Exam Vitals reviewed.  Constitutional:      General: She is not in acute distress.    Appearance: Normal appearance. She is not ill-appearing.  HENT:     Head: Normocephalic and atraumatic.     Right Ear: Tympanic membrane, ear canal and external ear normal. There is no impacted cerumen.     Left Ear: Tympanic membrane, ear canal and external ear normal. There is no impacted cerumen.     Nose: Congestion present.     Right Sinus: Maxillary sinus tenderness present.     Left Sinus: Maxillary sinus tenderness present.     Mouth/Throat:     Mouth: Mucous membranes are moist.     Pharynx: No posterior oropharyngeal erythema.  Eyes:     General: No scleral icterus.       Right eye: No discharge.        Left eye: No discharge.     Extraocular Movements: Extraocular movements intact.     Conjunctiva/sclera: Conjunctivae normal.     Pupils: Pupils are equal, round, and reactive to light.  Cardiovascular:     Rate and Rhythm: Normal rate and regular rhythm.     Pulses: Normal pulses.     Heart sounds: Normal heart  sounds. No murmur heard.    No friction rub. No gallop.  Pulmonary:     Effort: Pulmonary effort is normal. No respiratory distress.     Breath sounds: Normal breath sounds. No wheezing.  Lymphadenopathy:     Cervical: No cervical adenopathy.  Skin:    General: Skin is warm and dry.  Neurological:     Mental Status: She is alert and oriented to person, place, and time.  Psychiatric:        Mood and Affect: Mood normal.        Behavior: Behavior normal.        Thought Content: Thought content normal.        Judgment: Judgment normal.    Assessment & Plan   Acute bacterial sinusitis Persistent symptoms despite amoxicillin , suspected to be related to incorrect dosing. Considered Augmentin  for better efficacy. Adjusted prednisone  to avoid insomnia. - Discontinued amoxicillin . - Prescribed Augmentin  BID for seven days. - Adjust prednisone : two tablets in the morning for two days, then one tablet on the third day to complete the intended taper. - Continue Flonase  with proper technique. - Monitor symptoms and follow up if symptoms persist after Augmentin .     Follow up   Return if symptoms worsen or fail to improve. __________________________________ Zada FREDRIK Palin, DNP, APRN, FNP-BC Primary Care and  Sports Medicine [facility] [place] "

## 2024-06-11 ENCOUNTER — Encounter: Payer: Self-pay | Admitting: Medical-Surgical

## 2024-06-11 MED ORDER — NYSTATIN 100000 UNIT/ML MT SUSP: 500000.0000 [IU] | Freq: Four times a day (QID) | OROMUCOSAL | 0 refills | Status: AC

## 2024-08-27 ENCOUNTER — Ambulatory Visit: Admitting: Medical-Surgical

## 2024-11-29 ENCOUNTER — Telehealth (HOSPITAL_COMMUNITY): Payer: Self-pay | Admitting: Psychiatry
